# Patient Record
Sex: Female | Born: 1945 | Race: White | Hispanic: No | Smoking: Former smoker
Health system: Southern US, Community
[De-identification: ages and names within clinical notes are randomized; demographics above are authoritative.]

## PROBLEM LIST (undated history)

## (undated) DIAGNOSIS — M810 Age-related osteoporosis without current pathological fracture: Secondary | ICD-10-CM

## (undated) DIAGNOSIS — G4730 Sleep apnea, unspecified: Secondary | ICD-10-CM

## (undated) DIAGNOSIS — F988 Other specified behavioral and emotional disorders with onset usually occurring in childhood and adolescence: Secondary | ICD-10-CM

## (undated) DIAGNOSIS — Z6821 Body mass index (BMI) 21.0-21.9, adult: Secondary | ICD-10-CM

## (undated) DIAGNOSIS — K219 Gastro-esophageal reflux disease without esophagitis: Secondary | ICD-10-CM

## (undated) DIAGNOSIS — E039 Hypothyroidism, unspecified: Secondary | ICD-10-CM

## (undated) DIAGNOSIS — D369 Benign neoplasm, unspecified site: Secondary | ICD-10-CM

## (undated) DIAGNOSIS — M109 Gout, unspecified: Secondary | ICD-10-CM

## (undated) DIAGNOSIS — F32A Depression, unspecified: Secondary | ICD-10-CM

## (undated) DIAGNOSIS — D803 Selective deficiency of immunoglobulin G [IgG] subclasses: Secondary | ICD-10-CM

## (undated) DIAGNOSIS — G459 Transient cerebral ischemic attack, unspecified: Secondary | ICD-10-CM

## (undated) DIAGNOSIS — R42 Dizziness and giddiness: Secondary | ICD-10-CM

## (undated) DIAGNOSIS — M797 Fibromyalgia: Secondary | ICD-10-CM

## (undated) DIAGNOSIS — I471 Supraventricular tachycardia, unspecified: Secondary | ICD-10-CM

## (undated) DIAGNOSIS — R002 Palpitations: Secondary | ICD-10-CM

## (undated) DIAGNOSIS — E559 Vitamin D deficiency, unspecified: Secondary | ICD-10-CM

## (undated) DIAGNOSIS — R5383 Other fatigue: Secondary | ICD-10-CM

## (undated) DIAGNOSIS — M25552 Pain in left hip: Secondary | ICD-10-CM

## (undated) DIAGNOSIS — I509 Heart failure, unspecified: Secondary | ICD-10-CM

## (undated) DIAGNOSIS — K635 Polyp of colon: Secondary | ICD-10-CM

## (undated) DIAGNOSIS — G8929 Other chronic pain: Secondary | ICD-10-CM

## (undated) DIAGNOSIS — M25562 Pain in left knee: Secondary | ICD-10-CM

## (undated) DIAGNOSIS — D509 Iron deficiency anemia, unspecified: Secondary | ICD-10-CM

## (undated) DIAGNOSIS — M1990 Unspecified osteoarthritis, unspecified site: Secondary | ICD-10-CM

## (undated) DIAGNOSIS — M722 Plantar fascial fibromatosis: Secondary | ICD-10-CM

## (undated) DIAGNOSIS — F419 Anxiety disorder, unspecified: Secondary | ICD-10-CM

## (undated) DIAGNOSIS — E785 Hyperlipidemia, unspecified: Secondary | ICD-10-CM

## (undated) DIAGNOSIS — IMO0002 Reserved for concepts with insufficient information to code with codable children: Secondary | ICD-10-CM

## (undated) DIAGNOSIS — R5381 Other malaise: Secondary | ICD-10-CM

## (undated) DIAGNOSIS — M549 Dorsalgia, unspecified: Secondary | ICD-10-CM

## (undated) DIAGNOSIS — M7552 Bursitis of left shoulder: Secondary | ICD-10-CM

## (undated) DIAGNOSIS — I10 Essential (primary) hypertension: Secondary | ICD-10-CM

## (undated) DIAGNOSIS — J449 Chronic obstructive pulmonary disease, unspecified: Secondary | ICD-10-CM

## (undated) DIAGNOSIS — Z8709 Personal history of other diseases of the respiratory system: Secondary | ICD-10-CM

## (undated) DIAGNOSIS — G4700 Insomnia, unspecified: Secondary | ICD-10-CM

## (undated) DIAGNOSIS — J441 Chronic obstructive pulmonary disease with (acute) exacerbation: Secondary | ICD-10-CM

## (undated) DIAGNOSIS — I6529 Occlusion and stenosis of unspecified carotid artery: Secondary | ICD-10-CM

## (undated) DIAGNOSIS — N809 Endometriosis, unspecified: Secondary | ICD-10-CM

## (undated) DIAGNOSIS — F329 Major depressive disorder, single episode, unspecified: Secondary | ICD-10-CM

## (undated) DIAGNOSIS — E538 Deficiency of other specified B group vitamins: Secondary | ICD-10-CM

## (undated) DIAGNOSIS — L719 Rosacea, unspecified: Secondary | ICD-10-CM

## (undated) HISTORY — PX: YAG LASER APPLICATION: SHX6189

## (undated) HISTORY — DX: Body mass index (BMI) 21.0-21.9, adult: Z68.21

## (undated) HISTORY — DX: Other fatigue: R53.83

## (undated) HISTORY — DX: Other specified behavioral and emotional disorders with onset usually occurring in childhood and adolescence: F98.8

## (undated) HISTORY — DX: Heart failure, unspecified: I50.9

## (undated) HISTORY — DX: Supraventricular tachycardia, unspecified: I47.10

## (undated) HISTORY — DX: Deficiency of other specified B group vitamins: E53.8

## (undated) HISTORY — DX: Hypothyroidism, unspecified: E03.9

## (undated) HISTORY — DX: Transient cerebral ischemic attack, unspecified: G45.9

## (undated) HISTORY — PX: BACK SURGERY: SHX140

## (undated) HISTORY — DX: Chronic obstructive pulmonary disease with (acute) exacerbation: J44.1

## (undated) HISTORY — DX: Rosacea, unspecified: L71.9

## (undated) HISTORY — DX: Personal history of other diseases of the respiratory system: Z87.09

## (undated) HISTORY — DX: Other malaise: R53.81

## (undated) HISTORY — DX: Depression, unspecified: F32.A

## (undated) HISTORY — PX: SPINAL CORD STIMULATOR INSERTION: SHX5378

## (undated) HISTORY — DX: Gastro-esophageal reflux disease without esophagitis: K21.9

## (undated) HISTORY — DX: Dizziness and giddiness: R42

## (undated) HISTORY — DX: Benign neoplasm, unspecified site: D36.9

## (undated) HISTORY — DX: Pain in left hip: M25.552

## (undated) HISTORY — DX: Hyperlipidemia, unspecified: E78.5

## (undated) HISTORY — DX: Sleep apnea, unspecified: G47.30

## (undated) HISTORY — PX: VESICOVAGINAL FISTULA CLOSURE W/ TAH: SUR271

## (undated) HISTORY — PX: TONSILLECTOMY AND ADENOIDECTOMY: SUR1326

## (undated) HISTORY — DX: Gout, unspecified: M10.9

## (undated) HISTORY — DX: Reserved for concepts with insufficient information to code with codable children: IMO0002

## (undated) HISTORY — PX: TUBAL LIGATION: SHX77

## (undated) HISTORY — DX: Fibromyalgia: M79.7

## (undated) HISTORY — DX: Bursitis of left shoulder: M75.52

## (undated) HISTORY — DX: Insomnia, unspecified: G47.00

## (undated) HISTORY — DX: Unspecified osteoarthritis, unspecified site: M19.90

## (undated) HISTORY — DX: Occlusion and stenosis of unspecified carotid artery: I65.29

## (undated) HISTORY — DX: Selective deficiency of immunoglobulin g (igg) subclasses: D80.3

## (undated) HISTORY — DX: Age-related osteoporosis without current pathological fracture: M81.0

## (undated) HISTORY — PX: VAGINAL HYSTERECTOMY: SUR661

## (undated) HISTORY — PX: OTHER SURGICAL HISTORY: SHX169

## (undated) HISTORY — DX: Chronic obstructive pulmonary disease, unspecified: J44.9

## (undated) HISTORY — DX: Major depressive disorder, single episode, unspecified: F32.9

## (undated) HISTORY — PX: COLONOSCOPY: SHX174

## (undated) HISTORY — DX: Iron deficiency anemia, unspecified: D50.9

## (undated) HISTORY — DX: Palpitations: R00.2

## (undated) HISTORY — DX: Anxiety disorder, unspecified: F41.9

## (undated) HISTORY — DX: Polyp of colon: K63.5

## (undated) HISTORY — DX: Supraventricular tachycardia: I47.1

## (undated) HISTORY — DX: Essential (primary) hypertension: I10

## (undated) HISTORY — DX: Endometriosis, unspecified: N80.9

## (undated) HISTORY — DX: Vitamin D deficiency, unspecified: E55.9

## (undated) HISTORY — DX: Plantar fascial fibromatosis: M72.2

## (undated) HISTORY — DX: Pain in left knee: M25.562

---

## 2002-04-12 ENCOUNTER — Ambulatory Visit (HOSPITAL_BASED_OUTPATIENT_CLINIC_OR_DEPARTMENT_OTHER): Admission: RE | Admit: 2002-04-12 | Discharge: 2002-04-12 | Payer: Self-pay | Admitting: Orthopedic Surgery

## 2005-04-08 HISTORY — PX: COLONOSCOPY: SHX174

## 2008-02-10 ENCOUNTER — Encounter: Admission: RE | Admit: 2008-02-10 | Discharge: 2008-02-10 | Payer: Self-pay | Admitting: Orthopaedic Surgery

## 2008-06-29 ENCOUNTER — Inpatient Hospital Stay (HOSPITAL_COMMUNITY): Admission: RE | Admit: 2008-06-29 | Discharge: 2008-07-02 | Payer: Self-pay | Admitting: Neurosurgery

## 2008-09-07 ENCOUNTER — Encounter: Admission: RE | Admit: 2008-09-07 | Discharge: 2008-09-07 | Payer: Self-pay | Admitting: Neurosurgery

## 2008-09-11 ENCOUNTER — Encounter: Admission: RE | Admit: 2008-09-11 | Discharge: 2008-09-11 | Payer: Self-pay | Admitting: Neurosurgery

## 2008-11-01 ENCOUNTER — Encounter: Admission: RE | Admit: 2008-11-01 | Discharge: 2008-11-01 | Payer: Self-pay | Admitting: Neurosurgery

## 2008-11-09 ENCOUNTER — Encounter: Admission: RE | Admit: 2008-11-09 | Discharge: 2008-11-09 | Payer: Self-pay | Admitting: Neurosurgery

## 2009-01-11 ENCOUNTER — Encounter: Admission: RE | Admit: 2009-01-11 | Discharge: 2009-01-11 | Payer: Self-pay | Admitting: Neurosurgery

## 2011-04-09 NOTE — Op Note (Signed)
Samantha Fletcher, Samantha Fletcher NO.:  [id number]   MEDICAL RECORD NO.:  [id number]          PATIENT TYPE:  INP   LOCATION:  2899                         FACILITY:  [facility]   PHYSICIAN:  Donalee Citrin, M.D.        DATE OF BIRTH:  March 22, 1946   DATE OF PROCEDURE:  06/29/2008  DATE OF DISCHARGE:                               OPERATIVE REPORT   PREOPERATIVE DIAGNOSES:  Lumbar spinal stenosis, degenerative disk  disease, and facet arthropathy L4-L5.   PROCEDURES:  Decompressive laminectomy at L4-L5 in excess of what will  be needed with standard interbody fusion, posterior lumbar interbody  fusion at L4-L5 using two 8 x 22 mm Telamon PEEK cages packed with local  harvested autograft from the laminotomy, pedicle screw fixation at L4-L5  using the 6.35 Legacy pedicle screw system, posterolateral arthrodesis  at L4-L5 using locally harvested autograft, and placing the Hemovac  drain.   SURGEON:  Donalee Citrin, MD   ASSISTANT:  Tia Alert, MD   ANESTHESIA.:  General endotracheal.   HISTORY OF PRESENT ILLNESS:  The patient, Samantha Fletcher, is a 65 year old  female who has had progressive worsening back, bilateral leg pain,  neurogenic claudication after only short distances in the left greater  than right, and radiculopathy ranging in an L4-L5 nerve root pattern.  MRI scan showed severe facet arthropathy and herniated disk at L4-L5  causing severe spinal stenosis both centrally and biforaminally, worse  on the left.  The patient failed all forms of conservative treatment and  was recommended decompressive laminectomy, diskectomy, and fusion.  Risks and benefits of the operation were explained to the patient.  She  understood and agreed to proceed forward.   The patient was brought to the OR, induced under general anesthesia, and  positioned prone on the Wilson frame.  Back was prepped in usual sterile  fashion.  Preop incision was localized at the appropriate level.  After  the  infiltration of 10 mL lidocaine with epi, a midline incision was  made.  Bovie electrocautery was used to take down this.  Subperiosteal  dissection carried out on lamina of L4 and L5.  Intraoperative x-ray  confirmed localization of the L4 TP.  Then, the entire lamina, laminar  complex, and spinous process of L4 was removed and complete medial  facetectomies were performed at that level also.  The facets noted to be  markedly degenerating, competent, and displacing the thecal sac in a  hourglass fashion.  This was all teased away from the dura with a 4  Penfield, removed piecemeal fashion completing the medial facetectomies.  Both the 4 and the 5 roots were markedly stenotic, especially the 4 root  in the superior aspect of the facet.  This was all under bitten and  teased away.  After the decompression had been completed, the L5 nerve  root was unroofed.  It was decompressed except for the disk space behind  the __________, so first attention was taken to the pedicle screw  placement.  Using a high-speed drill, pilot holes were drilled at L4 on  the right,  cannulated with the awl, tapped with 5-5 tap, probed again,  and a 6 x 45 screw was inserted on the right at L4.  Fluoroscopy  confirmed at each step along the way trajectory in depth using both  external and internal canalicular landmarks.  The placement was  confirmed to have no medial or lateral breach.  The L5 screw was  inserted on the right in a similar fashion as well as at L4-L5 on the  left.  All screws were 6 x 45.  After all screws were in place,  attention was taken to the interbody work.  Disk space was incised on  the right at L4-L5, noted to be markedly collapsed and degenerated.  A  size 8 distractor was inserted.  Fluoroscopy confirmed this to be  appropriate size and for the graft material, so with the distractor in  place two 8 x 22 mm Telamons were opened working the disk space on the  left.  The disk space was  cleaned out and scraped with a size 8 cutter  and chisel.  A large central disk fragment was removed and scraped with  an Epstein curette and then a Telamon PEEK cage packed with local  autograft was packed in the left-sided interspace.  Then, the distractor  was removed.  Fluoroscopy confirmed good positioning of the graft.  Then, on the right side, the interspace was cleaned out in a similar  fashion.  After all central endplates had been scraped and the central  disk had been removed, local autograft was packed centrally against the  left-sided Telamon and a right-sided Telamon was inserted.  After all  interbody space had been done, the wound was copiously irrigated.  Meticulous hemostasis was maintained.  Aggressive decortication carried  on TPs and lateral gutters.  The remainder of the local autograft was  packed along the TPs at L4-L5.  Then, 30-mm rods were inserted,  __________ tightened down at L5.  The L4 pedicle screw was then  compressed against the L5 and then a medium Hemovac drain was placed.  Postop fluoroscopy confirmed good position of everything, all screws,  rods, and interbody spacers.  Then, after the drain was placed and the  foramina were confirmed to be widely patent, Gelfoam was overlaid on top  of the dura.  The muscle fascia were closed in layers with interrupted  Vicryl in a running 4-0 subcuticular to the skin.  Benzoin and Steri-  strips were applied.  The patient went to the recovery room in stable  condition.           ______________________________  Donalee Citrin, M.D.     GC/MEDQ  D:  06/29/2008  T:  06/30/2008  Job:  [id number]

## 2011-04-12 NOTE — Discharge Summary (Signed)
NAMEJAZELL, Samantha Fletcher NO.:  [id number]   MEDICAL RECORD NO.:  [id number]          PATIENT TYPE:  INP   LOCATION:  3018                         FACILITY:  [facility]   PHYSICIAN:  Donalee Citrin, M.D.        DATE OF BIRTH:  06/11/1946   DATE OF ADMISSION:  06/29/2008  DATE OF DISCHARGE:  07/02/2008                               DISCHARGE SUMMARY   ADMITTING DIAGNOSES:  Degenerative disk disease and lumbar spinal  stenosis at L4-5.   PROCEDURE:  __________ L4-5 posterior lumbar interbody fusion.   FINAL DIAGNOSIS AND DISCHARGE DIAGNOSIS:  Lumbar spinal stenosis at L4-  L5.   HISTORY OF PRESENT ILLNESS:  The patient is a very pleasant 65 year old  female who was admitted in [facility], went to the operating room, underwent  the aforementioned procedure.  Postoperatively, the patient did very  well and recovering on the floor.  The patient had complete resolution  of her leg pain.  She was progressively mobilized on the first and  second postoperative day.  Her Foley __________ taken out on day 1.  Physical therapy worked with her.  She was ambulating and voiding  spontaneously by day 3 and able to be discharged home, was scheduled to  follow up in 2 weeks.           ______________________________  Donalee Citrin, M.D.     GC/MEDQ  D:  07/28/2008  T:  07/29/2008  Job:  [id number]

## 2011-04-12 NOTE — Op Note (Signed)
[place]. [facility]  Patient:    Samantha Fletcher Visit Number: [id number] MRN: [id number]          Service Type: DSU Location: [facility] Attending Physician:  Alinda Deem Dictated by:   Alinda Deem, M.D. Proc. Date: 04/12/02 Admit Date:  04/12/2002 Discharge Date: 04/12/2002                             Operative Report  PREOPERATIVE DIAGNOSIS:  Right knee lateral meniscal tear.  POSTOPERATIVE DIAGNOSES: 1. Right knee lateral meniscal tear. 2. 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 3. Removal of loose body.  PROCEDURE:  Right knee partial arthroscopic lateral meniscectomy, debridement of chondromalacia from the medial femoral condyle and from the patella, and also debridement of a 5% tear of the ACL, and removal of loose body.  SURGEON:  Alinda Deem, M.D.  FIRST ASSISTANT:  Dorthula Matas, P.A.-C.  ANESTHESIA:  Local with IV sedation.  ESTIMATED BLOOD LOSS:  Minimal.  FLUID REPLACEMENT:  800 cc of crystalloid.  DRAINS PLACED:  None.  TOURNIQUET TIME:  None.  INDICATIONS FOR PROCEDURE:  A 65 year old woman who I believe was injured at work when she slipped and fell and sustained a lateral meniscal tear of the right knee.  She has failed conservative treatment, anti-inflammatory medicines, physical therapy, and activity modification.  MRI scan clearly shows a lateral meniscal tear, bucket handle-type, as well as some chondromalacia.  She is taken for arthroscopic evaluation and treatment of the right knee.  DESCRIPTION OF PROCEDURE:  The patient was identified by arm band and taken to the operating room at [facility].  Appropriate anesthetic monitors were attached and local anesthesia with IV sedation was induced into the right lower extremity.  Lateral post applied to the table.  The right lower extremity prepped and  draped in the usual sterile fashion from the ankle to the thigh.  A standard inferomedial and inferolateral parapatellar portals were then made, allowing introduction of the arthroscope through the inferolateral portal, and the outflow through the inferomedial portal.  Grade 3 chondromalacia of the apex of the distal pole of the patella was identified and debrided back to a stable margin with a 4.2 great white sucker shaver, and grade 3 to focal grade 4 chondromalacia of the distal and posterior condyles of the medial femoral condyle were identified and debrided as well.  The medial meniscus and medial tibial condyle as well as the lateral tibial condyle were in good shape as was the lateral femoral condyle.  The ACL had maybe 5% fiber tearing anteriorly.  This was debrided back to stable fibers. The lateral meniscus did have a bucket handle tear that required some extensive debridement with removal of most of the posterolateral corner of the lateral meniscus as well as the bucket handle section that went forward.  This was accomplished with a 4.2 great white sucker shaver.  The knee was washed out with normal saline solution.  The gutters were cleared.  We did find a loose body of cartilage hiding underneath the lateral meniscus and this was also removed.  The arthroscopic instruments were then removed.  A dressing of Xeroform, 4 x 4 dressing, sponges, Webril, and an Ace wrap applied. The patient was awakened and taken to the recovery room without difficulty. Dictated by:   Harrietta Guardian  Turner Daniels, M.D. Attending Physician:  Alinda Deem DD:  04/12/02 TD:  04/14/02 Job: [id number]

## 2011-08-15 ENCOUNTER — Other Ambulatory Visit: Payer: Self-pay | Admitting: Neurosurgery

## 2011-08-15 DIAGNOSIS — M545 Low back pain: Secondary | ICD-10-CM

## 2011-08-23 LAB — CBC
Hemoglobin: 12.2
MCHC: 32.5
RDW: 17.3 — ABNORMAL HIGH
WBC: 13 — ABNORMAL HIGH

## 2011-08-23 LAB — ABO/RH: ABO/RH(D): O NEG

## 2011-08-23 LAB — TYPE AND SCREEN: ABO/RH(D): O NEG

## 2011-08-23 LAB — BASIC METABOLIC PANEL: Glucose, Bld: 101 — ABNORMAL HIGH

## 2011-09-10 ENCOUNTER — Ambulatory Visit
Admission: RE | Admit: 2011-09-10 | Discharge: 2011-09-10 | Disposition: A | Discharge status: Home / Self Care | Payer: Medicare Other | Source: Ambulatory Visit | Attending: Neurosurgery | Admitting: Neurosurgery

## 2011-09-10 DIAGNOSIS — M545 Low back pain: Secondary | ICD-10-CM

## 2014-01-24 DIAGNOSIS — M5414 Radiculopathy, thoracic region: Secondary | ICD-10-CM | POA: Insufficient documentation

## 2014-01-24 DIAGNOSIS — M961 Postlaminectomy syndrome, not elsewhere classified: Secondary | ICD-10-CM

## 2014-01-24 DIAGNOSIS — R269 Unspecified abnormalities of gait and mobility: Secondary | ICD-10-CM

## 2014-01-24 DIAGNOSIS — M51379 Other intervertebral disc degeneration, lumbosacral region without mention of lumbar back pain or lower extremity pain: Secondary | ICD-10-CM

## 2014-01-24 DIAGNOSIS — M129 Arthropathy, unspecified: Secondary | ICD-10-CM | POA: Insufficient documentation

## 2014-01-24 DIAGNOSIS — M5417 Radiculopathy, lumbosacral region: Secondary | ICD-10-CM

## 2014-01-24 DIAGNOSIS — M5137 Other intervertebral disc degeneration, lumbosacral region: Secondary | ICD-10-CM

## 2014-01-24 DIAGNOSIS — M533 Sacrococcygeal disorders, not elsewhere classified: Secondary | ICD-10-CM

## 2014-01-24 HISTORY — DX: Other intervertebral disc degeneration, lumbosacral region: M51.37

## 2014-01-24 HISTORY — DX: Arthropathy, unspecified: M12.9

## 2014-01-24 HISTORY — DX: Sacrococcygeal disorders, not elsewhere classified: M53.3

## 2014-01-24 HISTORY — DX: Radiculopathy, thoracic region: M54.14

## 2014-01-24 HISTORY — DX: Postlaminectomy syndrome, not elsewhere classified: M96.1

## 2014-01-24 HISTORY — DX: Unspecified abnormalities of gait and mobility: R26.9

## 2014-01-24 HISTORY — DX: Other intervertebral disc degeneration, lumbosacral region without mention of lumbar back pain or lower extremity pain: M51.379

## 2014-01-24 HISTORY — DX: Radiculopathy, thoracic region: M54.17

## 2014-04-12 ENCOUNTER — Institutional Professional Consult (permissible substitution): Payer: Medicare Other | Admitting: Critical Care Medicine

## 2014-05-17 ENCOUNTER — Ambulatory Visit (INDEPENDENT_AMBULATORY_CARE_PROVIDER_SITE_OTHER): Payer: Medicare Other | Admitting: Critical Care Medicine

## 2014-05-17 ENCOUNTER — Encounter: Payer: Self-pay | Admitting: Critical Care Medicine

## 2014-05-17 VITALS — BP 126/64 | HR 69 | Temp 98.5°F | Ht 61.0 in | Wt 118.0 lb

## 2014-05-17 DIAGNOSIS — J4550 Severe persistent asthma, uncomplicated: Secondary | ICD-10-CM

## 2014-05-17 DIAGNOSIS — I6529 Occlusion and stenosis of unspecified carotid artery: Secondary | ICD-10-CM | POA: Insufficient documentation

## 2014-05-17 DIAGNOSIS — R059 Cough, unspecified: Secondary | ICD-10-CM

## 2014-05-17 DIAGNOSIS — R05 Cough: Secondary | ICD-10-CM | POA: Insufficient documentation

## 2014-05-17 DIAGNOSIS — E538 Deficiency of other specified B group vitamins: Secondary | ICD-10-CM | POA: Insufficient documentation

## 2014-05-17 DIAGNOSIS — F329 Major depressive disorder, single episode, unspecified: Secondary | ICD-10-CM | POA: Insufficient documentation

## 2014-05-17 DIAGNOSIS — D509 Iron deficiency anemia, unspecified: Secondary | ICD-10-CM | POA: Insufficient documentation

## 2014-05-17 DIAGNOSIS — E559 Vitamin D deficiency, unspecified: Secondary | ICD-10-CM | POA: Insufficient documentation

## 2014-05-17 DIAGNOSIS — D803 Selective deficiency of immunoglobulin G [IgG] subclasses: Secondary | ICD-10-CM | POA: Insufficient documentation

## 2014-05-17 DIAGNOSIS — E039 Hypothyroidism, unspecified: Secondary | ICD-10-CM | POA: Insufficient documentation

## 2014-05-17 DIAGNOSIS — J4551 Severe persistent asthma with (acute) exacerbation: Secondary | ICD-10-CM

## 2014-05-17 DIAGNOSIS — K219 Gastro-esophageal reflux disease without esophagitis: Secondary | ICD-10-CM

## 2014-05-17 DIAGNOSIS — J45901 Unspecified asthma with (acute) exacerbation: Secondary | ICD-10-CM

## 2014-05-17 DIAGNOSIS — F419 Anxiety disorder, unspecified: Secondary | ICD-10-CM | POA: Insufficient documentation

## 2014-05-17 DIAGNOSIS — F32A Depression, unspecified: Secondary | ICD-10-CM | POA: Insufficient documentation

## 2014-05-17 DIAGNOSIS — M810 Age-related osteoporosis without current pathological fracture: Secondary | ICD-10-CM | POA: Insufficient documentation

## 2014-05-17 DIAGNOSIS — M797 Fibromyalgia: Secondary | ICD-10-CM | POA: Insufficient documentation

## 2014-05-17 DIAGNOSIS — E785 Hyperlipidemia, unspecified: Secondary | ICD-10-CM | POA: Insufficient documentation

## 2014-05-17 DIAGNOSIS — G4730 Sleep apnea, unspecified: Secondary | ICD-10-CM

## 2014-05-17 HISTORY — DX: Cough, unspecified: R05.9

## 2014-05-17 MED ORDER — BUDESONIDE-FORMOTEROL FUMARATE 160-4.5 MCG/ACT IN AERO: 2.0000 | INHALATION_SPRAY | Freq: Two times a day (BID) | RESPIRATORY_TRACT | Status: DC

## 2014-05-17 MED ORDER — LOSARTAN POTASSIUM-HCTZ 100-12.5 MG PO TABS: 1.0000 | ORAL_TABLET | Freq: Every day | ORAL | Status: DC

## 2014-05-17 NOTE — Patient Instructions (Signed)
Start Symbicort two puff twice daily I will obtain labs from your primary care MD office Stop lisinopril/HCTZ  Start losartan 100mg  /HCTZ daily Breathing test will be obtained Records from [facility] office  Return 6 weeks

## 2014-05-17 NOTE — Assessment & Plan Note (Signed)
The patient would like to have her sleep apnea completely reevaluated Plan would be to refer to sleep medicine at [facility]

## 2014-05-17 NOTE — Assessment & Plan Note (Signed)
Severe persistent asthma with atopic features ACE inhibitor contributing to upper airway instability Incomplete database and that we need records from primary care for recent lab draws History of IgG deficiency documented 2011 need to see recent IgG level Plan Start Symbicort two puff twice daily Stop lisinopril/HCTZ  Start losartan 100mg  /HCTZ daily Spirometry pre-and post  Records from [facility] office  Return 6 weeks

## 2014-05-17 NOTE — Progress Notes (Signed)
Subjective:    Patient ID: Samantha Fletcher, female    DOB: 1946/02/10, 68 y.o.   MRN: [id number]  HPI Comments: Hx of chronic cough. Pt in hosp 12/2013: [facility]. ? Cause of cough ? PNA.  CXR neg.  Pt had fever, nausea, emesis.  D/c after 3d on abx and pred.  01/2014: another episode of syncope.  ? PNA. CT head neg.    Since past three months, no fever, coughs at night, mucus is clear to green. Pt has had immune issues  Cough This is a chronic problem. The current episode started more than 1 year ago. The problem has been waxing and waning. The cough is productive of purulent sputum. Associated symptoms include ear congestion, a fever, headaches, heartburn, hemoptysis, myalgias, nasal congestion, postnasal drip, rhinorrhea, sweats and wheezing. Pertinent negatives include no chest pain, chills, ear pain or shortness of breath. The symptoms are aggravated by fumes. Risk factors for lung disease include smoking/tobacco exposure (now smokes 4-5 cig per day). She has tried a beta-agonist inhaler for the symptoms. The treatment provided moderate relief. Her past medical history is significant for asthma, bronchitis, environmental allergies and pneumonia. There is no history of COPD or emphysema. skin testing pos years ago MOLD, dust, leaves    Past Medical History  Diagnosis Date  . Other malaise and fatigue   . Gout, unspecified   . Obstructive chronic bronchitis with exacerbation   . Rosacea   . Senile osteoporosis   . Hypertension   . Anxiety   . Depression   . Sleep apnea   . Vertigo   . IgG deficiency   . DDD (degenerative disc disease)   . DJD (degenerative joint disease)   . Hypothyroidism   . Hyperlipemia   . Attention deficit disorder   . Anemia, iron deficiency   . Gout   . Vitamin D deficiency   . TIA (transient ischemic attack)   . Vitamin B12 deficiency   . Insomnia   . Esophageal reflux   . Carotid artery occlusion     right  . Fibromyalgia   . Colon polyp   .  Osteoporosis      Family History  Problem Relation Age of Onset  . Emphysema Mother   . Rheum arthritis Mother      History   Social History  . Marital Status: Married    Spouse Name: N/A    Number of Children: N/A  . Years of Education: N/A   Occupational History  . Retired Sport and exercise psychologist   Social History Main Topics  . Smoking status: Current Every Day Smoker -- 0.25 packs/day    Types: Cigarettes    Start date: 11/25/1962  . Smokeless tobacco: Never Used  . Alcohol Use: No  . Drug Use: No  . Sexual Activity: Not on file   Other Topics Concern  . Not on file   Social History Narrative  . No narrative on file     Allergies  Allergen Reactions  . Celecoxib     Mouth sores  . Gabapentin     Confused, psychotic   . Nabumetone     Mouth sores  . Naproxen     Dropped WBC count  . Tramadol     Mouth sores     No outpatient prescriptions prior to visit.   No facility-administered medications prior to visit.      Review of Systems  Constitutional: Positive for fever,  appetite change, fatigue and unexpected weight change. Negative for chills.  HENT: Positive for postnasal drip, rhinorrhea, sinus pressure and trouble swallowing. Negative for ear pain.   Respiratory: Positive for cough, hemoptysis, chest tightness and wheezing. Negative for shortness of breath.   Cardiovascular: Negative for chest pain.  Gastrointestinal: Positive for heartburn.  Musculoskeletal: Positive for myalgias.  Allergic/Immunologic: Positive for environmental allergies.  Neurological: Positive for headaches.       Objective:   Physical Exam Filed Vitals:   05/17/14 1559  BP: 126/64  Pulse: 69  Temp: 98.5 F (36.9 C)  TempSrc: Oral  Height: 5\' 1"  (1.549 m)  Weight: 118 lb (53.524 kg)  SpO2: 94%    Gen: Pleasant, well-nourished, in no distress,  normal affect  ENT: No lesions,  mouth clear,  oropharynx clear, no postnasal drip  Neck: No JVD, no TMG, no  carotid bruits  Lungs: No use of accessory muscles, no dullness to percussion, expired wheezes with poor airflow  Cardiovascular: RRR, heart sounds normal, no murmur or gallops, no peripheral edema  Abdomen: soft and NT, no HSM,  BS normal  Musculoskeletal: No deformities, no cyanosis or clubbing  Neuro: alert, non focal  Skin: Diffuse ecchymoses  No results found.  Chest x-ray reviewed from previous along with CT scan and there is a right lower lobe tree in bud pattern in the CT scan from February 2015 that is not apparent on current chest x-ray  No recent spirometry available  Records from [facility] are reviewed and documented significant IgG deficiency with a level of 383 there is adequate antibody response of metastases seen on antibody testing HIV was negative       Assessment & Plan:   Asthma, severe persistent Severe persistent asthma with atopic features ACE inhibitor contributing to upper airway instability Incomplete database and that we need records from primary care for recent lab draws History of IgG deficiency documented 2011 need to see recent IgG level Plan Start Symbicort two puff twice daily Stop lisinopril/HCTZ  Start losartan 100mg  /HCTZ daily Spirometry pre-and post  Records from [facility] office  Return 6 weeks   Sleep apnea The patient would like to have her sleep apnea completely reevaluated Plan would be to refer to sleep medicine at [facility]    Updated Medication List Outpatient Encounter Prescriptions as of 05/17/2014  Medication Sig  . albuterol (PROAIR HFA) 108 (90 BASE) MCG/ACT inhaler 1-2 puffs every 4-6 hrs as needed  . allopurinol (ZYLOPRIM) 100 MG tablet Take 1 tablet by mouth daily.  Marland Kitchen ALPRAZolam (XANAX) 1 MG tablet Take 1 tablet by mouth 3 (three) times daily as needed.  Marland Kitchen ammonium lactate (LAC-HYDRIN) 12 % lotion Apply 1 application topically as needed for dry skin.  Marland Kitchen buPROPion (WELLBUTRIN XL) 150 MG 24 hr tablet Take 1 tablet by mouth  2 (two) times daily.  . Calcium Carbonate-Vitamin D (CALCIUM-CARB 600 + D PO) Take by mouth daily.  . cholecalciferol (VITAMIN D) 1000 UNITS tablet Take 1,000 Units by mouth daily.  . cyanocobalamin (,VITAMIN B-12,) 1000 MCG/ML injection 1 injection monthly  . cyclobenzaprine (FLEXERIL) 5 MG tablet Take 5 mg by mouth 3 (three) times daily as needed for muscle spasms.  . Dermatological Products, Misc. (DERMAREST ROSACEA EX) Apply topically 2 (two) times daily as needed.  . diclofenac sodium (VOLTAREN) 1 % GEL Apply 1 % topically as needed.  . ergocalciferol (VITAMIN D2) 50000 UNITS capsule Take 1 capsule by mouth every 7 (seven) days.  Marland Kitchen ezetimibe-simvastatin (VYTORIN) 10-20  MG per tablet Take 1 tablet by mouth at bedtime.  . Ferrous Fum-Iron Polysacch (TANDEM PO) Take 1 capsule by mouth daily.  Marland Kitchen levothyroxine (SYNTHROID) 75 MCG tablet Take 1 tablet by mouth daily.  Marland Kitchen LINZESS 145 MCG CAPS capsule Take 1 capsule by mouth daily.  . meclizine (ANTIVERT) 25 MG tablet Take 25 mg by mouth 3 (three) times daily as needed.  . metroNIDAZOLE (METROGEL) 1 % gel Twice daily  . Milnacipran (SAVELLA) 50 MG TABS tablet Take 50 mg by mouth 2 (two) times daily.  . mirabegron ER (MYRBETRIQ) 25 MG TB24 tablet Take 25 mg by mouth daily.  Marland Kitchen morphine (MS CONTIN) 30 MG 12 hr tablet Take 30 mg by mouth 2 (two) times daily.  . nitrofurantoin, macrocrystal-monohydrate, (MACROBID) 100 MG capsule Take 100 mg by mouth daily.  Marland Kitchen oxyCODONE-acetaminophen (PERCOCET) 10-325 MG per tablet Take 1 tablet by mouth every 6 (six) hours as needed.  . promethazine (PHENERGAN) 25 MG tablet Take 25 mg by mouth as needed.  . ranitidine (ZANTAC) 300 MG tablet Take 1 tablet by mouth at bedtime.  Marland Kitchen zolpidem (AMBIEN) 10 MG tablet Take 10 mg by mouth at bedtime.  . [DISCONTINUED] lisinopril-hydrochlorothiazide (PRINZIDE,ZESTORETIC) 20-25 MG per tablet Take 1 tablet by mouth daily.  . budesonide-formoterol (SYMBICORT) 160-4.5 MCG/ACT inhaler  Inhale 2 puffs into the lungs 2 (two) times daily.  Marland Kitchen losartan-hydrochlorothiazide (HYZAAR) 100-12.5 MG per tablet Take 1 tablet by mouth daily.

## 2014-05-23 ENCOUNTER — Encounter: Payer: Self-pay | Admitting: Critical Care Medicine

## 2014-05-23 LAB — PULMONARY FUNCTION TEST

## 2014-05-25 ENCOUNTER — Telehealth: Payer: Self-pay | Admitting: Critical Care Medicine

## 2014-05-25 DIAGNOSIS — J4550 Severe persistent asthma, uncomplicated: Secondary | ICD-10-CM

## 2014-05-25 NOTE — Telephone Encounter (Signed)
Called, spoke with pt.  Informed her of below results and recs per Dr. Joya Gaskins.   She verbalized understanding and is aware of pending appt with PW on Aug 4 at 2 pm in [place]. She voiced no further questions or concerns at this time.

## 2014-05-25 NOTE — Telephone Encounter (Signed)
Tell the pt spirometry was NORMAL.    Stay on symbicort for now Keep next appt

## 2014-06-02 DIAGNOSIS — M7060 Trochanteric bursitis, unspecified hip: Secondary | ICD-10-CM

## 2014-06-02 HISTORY — DX: Trochanteric bursitis, unspecified hip: M70.60

## 2014-06-15 ENCOUNTER — Encounter: Payer: Self-pay | Admitting: Critical Care Medicine

## 2014-06-28 ENCOUNTER — Encounter: Payer: Self-pay | Admitting: Critical Care Medicine

## 2014-06-28 ENCOUNTER — Ambulatory Visit (INDEPENDENT_AMBULATORY_CARE_PROVIDER_SITE_OTHER): Payer: Medicare Other | Admitting: Critical Care Medicine

## 2014-06-28 VITALS — BP 100/58 | HR 71 | Temp 98.7°F | Ht 61.75 in | Wt 119.0 lb

## 2014-06-28 DIAGNOSIS — J45909 Unspecified asthma, uncomplicated: Secondary | ICD-10-CM

## 2014-06-28 DIAGNOSIS — J4550 Severe persistent asthma, uncomplicated: Secondary | ICD-10-CM

## 2014-06-28 DIAGNOSIS — F17200 Nicotine dependence, unspecified, uncomplicated: Secondary | ICD-10-CM

## 2014-06-28 HISTORY — DX: Nicotine dependence, unspecified, uncomplicated: F17.200

## 2014-06-28 NOTE — Assessment & Plan Note (Signed)
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

## 2014-06-28 NOTE — Patient Instructions (Signed)
Stop symbicort  Use proair as needed Return as needed

## 2014-06-28 NOTE — Assessment & Plan Note (Signed)
Ongoing tobacco use Patient was again advised to pursue smoking cessation

## 2014-06-28 NOTE — Progress Notes (Signed)
Subjective:    Patient ID: Samantha Fletcher, female    DOB: Aug 05, 1946, 68 y.o.   MRN: [id number]  HPI Comments: Hx of chronic cough. Pt in hosp 12/2013: [facility]. ? Cause of cough ? PNA.  CXR neg.  Pt had fever, nausea, emesis.  D/c after 3d on abx and pred.  01/2014: another episode of syncope.  ? PNA. CT head neg.    Since past three months, no fever, coughs at night, mucus is clear to green. Pt has had immune issues  06/28/2014 Chief Complaint  Patient presents with  . Follow-up    feeling about the same,no cough,no wheeze, clearing throat a lot since using Symbicort last time,poor appetite  No real change in symbicorts and irritates the throat The cough is not present at this time No change in dyspnea Ongoing tobacco use one pack per day      Review of Systems  Constitutional: Positive for appetite change, fatigue and unexpected weight change.  HENT: Positive for sinus pressure and trouble swallowing.   Respiratory: Positive for chest tightness.        Objective:   Physical Exam  Filed Vitals:   06/28/14 1414  BP: 100/58  Pulse: 71  Temp: 98.7 F (37.1 C)  TempSrc: Oral  Height: 5' 1.75" (1.568 m)  Weight: 119 lb (53.978 kg)  SpO2: 96%    Gen: Pleasant, well-nourished, in no distress,  normal affect  ENT: No lesions,  mouth clear,  oropharynx clear, no postnasal drip  Neck: No JVD, no TMG, no carotid bruits  Lungs: No use of accessory muscles, no dullness to percussion, fair airflow  Cardiovascular: RRR, heart sounds normal, no murmur or gallops, no peripheral edema  Abdomen: soft and NT, no HSM,  BS normal  Musculoskeletal: No deformities, no cyanosis or clubbing  Neuro: alert, non focal  Skin: Diffuse ecchymoses  No results found.  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 albuterol (PROAIR HFA) 108 (90 BASE) MCG/ACT inhaler 1-2 puffs every 4-6 hrs as needed  . allopurinol (ZYLOPRIM) 100 MG tablet Take 1 tablet by mouth daily.  Marland Kitchen ALPRAZolam (XANAX) 1 MG tablet Take 1 tablet by mouth 3 (three) times daily as needed.  Marland Kitchen ammonium lactate (LAC-HYDRIN) 12 % lotion Apply 1 application topically as needed for dry skin.  Marland Kitchen buPROPion (WELLBUTRIN XL) 300 MG 24 hr tablet Take 300 mg by mouth daily.  . Calcium Carbonate-Vitamin D (CALCIUM-CARB 600 + D PO) Take by mouth daily.  . cholecalciferol (VITAMIN D) 1000 UNITS tablet Take 1,000 Units by mouth daily.  . cyanocobalamin (,VITAMIN B-12,) 1000 MCG/ML injection 1 injection monthly  . cyclobenzaprine (FLEXERIL) 5 MG tablet Take  5 mg by mouth 3 (three) times daily as needed for muscle spasms.  . Dermatological Products, Misc. (DERMAREST ROSACEA EX) Apply topically 2 (two) times daily as needed.  . diclofenac sodium (VOLTAREN) 1 % GEL Apply 1 % topically as needed.  . ergocalciferol (VITAMIN D2) 50000 UNITS capsule Take 1 capsule by mouth every 7 (seven) days.  Marland Kitchen ezetimibe-simvastatin (VYTORIN) 10-20 MG per tablet Take 1 tablet by mouth at bedtime.  . Ferrous Fum-Iron Polysacch (TANDEM PO) Take 1 capsule by mouth daily.  Marland Kitchen levothyroxine (SYNTHROID) 75 MCG tablet Take 1 tablet by mouth  daily.  Marland Kitchen LINZESS 145 MCG CAPS capsule Take 1 capsule by mouth daily.  Marland Kitchen losartan-hydrochlorothiazide (HYZAAR) 100-12.5 MG per tablet Take 1 tablet by mouth daily.  . meclizine (ANTIVERT) 25 MG tablet Take 25 mg by mouth 3 (three) times daily as needed.  . metroNIDAZOLE (METROGEL) 1 % gel Twice daily  . Milnacipran (SAVELLA) 50 MG TABS tablet Take 50 mg by mouth 2 (two) times daily.  . mirabegron ER (MYRBETRIQ) 25 MG TB24 tablet Take 25 mg by mouth daily.  Marland Kitchen morphine (MS CONTIN) 30 MG 12 hr tablet Take 30 mg by mouth 2 (two) times daily.  Marland Kitchen oxyCODONE-acetaminophen (PERCOCET) 10-325 MG per tablet Take 1 tablet by mouth every 6 (six) hours as needed.  . promethazine (PHENERGAN) 25 MG tablet Take 25 mg by mouth as needed.  . ranitidine (ZANTAC) 300 MG tablet Take 1 tablet by mouth at bedtime.  . SSD 1 % cream Once a day topically  . zolpidem (AMBIEN) 10 MG tablet Take 10 mg by mouth at bedtime.  . [DISCONTINUED] budesonide-formoterol (SYMBICORT) 160-4.5 MCG/ACT inhaler Inhale 2 puffs into the lungs 2 (two) times daily.  . nitrofurantoin, macrocrystal-monohydrate, (MACROBID) 100 MG capsule Take 100 mg by mouth daily.  . [DISCONTINUED] buPROPion (WELLBUTRIN XL) 150 MG 24 hr tablet Take 1 tablet by mouth 2 (two) times daily.

## 2014-07-29 DIAGNOSIS — Z87440 Personal history of urinary (tract) infections: Secondary | ICD-10-CM

## 2014-07-29 DIAGNOSIS — R339 Retention of urine, unspecified: Secondary | ICD-10-CM

## 2014-07-29 HISTORY — DX: Personal history of urinary (tract) infections: Z87.440

## 2014-07-29 HISTORY — DX: Retention of urine, unspecified: R33.9

## 2015-06-13 ENCOUNTER — Other Ambulatory Visit: Payer: Self-pay | Admitting: Critical Care Medicine

## 2015-06-14 NOTE — Telephone Encounter (Signed)
Pt needs appointment for further refills 

## 2015-07-13 ENCOUNTER — Other Ambulatory Visit: Payer: Self-pay | Admitting: Critical Care Medicine

## 2016-01-24 ENCOUNTER — Ambulatory Visit (INDEPENDENT_AMBULATORY_CARE_PROVIDER_SITE_OTHER): Payer: 59

## 2016-01-24 ENCOUNTER — Encounter: Payer: Self-pay | Admitting: Sports Medicine

## 2016-01-24 ENCOUNTER — Ambulatory Visit (INDEPENDENT_AMBULATORY_CARE_PROVIDER_SITE_OTHER): Payer: 59 | Admitting: Sports Medicine

## 2016-01-24 DIAGNOSIS — R29898 Other symptoms and signs involving the musculoskeletal system: Secondary | ICD-10-CM | POA: Diagnosis not present

## 2016-01-24 DIAGNOSIS — M216X1 Other acquired deformities of right foot: Secondary | ICD-10-CM

## 2016-01-24 DIAGNOSIS — M2040 Other hammer toe(s) (acquired), unspecified foot: Secondary | ICD-10-CM

## 2016-01-24 DIAGNOSIS — L909 Atrophic disorder of skin, unspecified: Secondary | ICD-10-CM

## 2016-01-24 DIAGNOSIS — M79673 Pain in unspecified foot: Secondary | ICD-10-CM

## 2016-01-24 NOTE — Progress Notes (Signed)
Patient ID: JAZIRA MALONEY, female   DOB: Feb 14, 1946, 70 y.o.   MRN: [id number] Subjective: TRELLA THURMOND is a 70 y.o. female patient who presents to office for evaluation of Right> Left foot pain. Patient states that both her feet hurt along the bottoms diffusely. Admits to a generalized burning sensation and pain at the prominent metatarsal on right foot and at bilateral second toes at the tip. Patient admits to a previous history of plantar fasciitis and also a history significant for lumbar radiculopathy with several spinal surgeries. Patient states that she has tried inserts and states that the softer cushioned inserts and pads seem to help.  Patient denies any other pedal complaints. Denies injury/trip/fall/sprain/any other causative factors.   Patient Active Problem List   Diagnosis Date Noted  . Tobacco use disorder 06/28/2014  . Bursitis, trochanteric 06/02/2014  . Cough 05/17/2014  . Anxiety   . Depression   . Sleep apnea   . IgG deficiency ([place])   . Hypothyroidism   . Hyperlipemia   . Anemia, iron deficiency   . Vitamin D deficiency   . Vitamin B12 deficiency   . Fibromyalgia   . Osteoporosis   . Carotid artery occlusion   . Esophageal reflux   . DDD (degenerative disc disease), lumbosacral 01/24/2014  . Arthropathia 01/24/2014  . Disorder of sacrum 01/24/2014  . 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 Thoracic and lumbosacral neuritis 01/24/2014  . Abnormal gait 01/24/2014    Current Outpatient Prescriptions on File Prior to Visit  Medication Sig Dispense Refill  . albuterol (PROAIR HFA) 108 (90 BASE) MCG/ACT inhaler 1-2 puffs every 4-6 hrs as needed    . allopurinol (ZYLOPRIM) 100 MG tablet Take 1 tablet by mouth daily.    Marland Kitchen ALPRAZolam (XANAX) 1 MG tablet Take 1 tablet by mouth 3 (three) times daily as needed.    Marland Kitchen ammonium lactate (LAC-HYDRIN) 12 % lotion Apply 1 application topically as needed for dry skin.    Marland Kitchen buPROPion (WELLBUTRIN XL) 300 MG 24 hr  tablet Take 300 mg by mouth daily.    . Calcium Carbonate-Vitamin D (CALCIUM-CARB 600 + D PO) Take by mouth daily.    . cholecalciferol (VITAMIN D) 1000 UNITS tablet Take 1,000 Units by mouth daily.    . cyanocobalamin (,VITAMIN B-12,) 1000 MCG/ML injection 1 injection monthly    . cyclobenzaprine (FLEXERIL) 5 MG tablet Take 5 mg by mouth 3 (three) times daily as needed for muscle spasms.    . Dermatological Products, Misc. (DERMAREST ROSACEA EX) Apply topically 2 (two) times daily as needed.    . diclofenac sodium (VOLTAREN) 1 % GEL Apply 1 % topically as needed.    . ergocalciferol (VITAMIN D2) 50000 UNITS capsule Take 1 capsule by mouth every 7 (seven) days.    Marland Kitchen ezetimibe-simvastatin (VYTORIN) 10-20 MG per tablet Take 1 tablet by mouth at bedtime.    . Ferrous Fum-Iron Polysacch (TANDEM PO) Take 1 capsule by mouth daily.    Marland Kitchen levothyroxine (SYNTHROID) 75 MCG tablet Take 1 tablet by mouth daily.    Marland Kitchen LINZESS 145 MCG CAPS capsule Take 1 capsule by mouth daily.    Marland Kitchen losartan-hydrochlorothiazide (HYZAAR) 100-12.5 MG per tablet TAKE 1 TABLET BY MOUTH EVERY DAY 30 tablet 2  . meclizine (ANTIVERT) 25 MG tablet Take 25 mg by mouth 3 (three) times daily as needed.    . metroNIDAZOLE (METROGEL) 1 % gel Twice daily    . Milnacipran (SAVELLA) 50 MG TABS tablet  Take 50 mg by mouth 2 (two) times daily.    . mirabegron ER (MYRBETRIQ) 25 MG TB24 tablet Take 25 mg by mouth daily.    Marland Kitchen morphine (MS CONTIN) 30 MG 12 hr tablet Take 30 mg by mouth 2 (two) times daily.    . nitrofurantoin, macrocrystal-monohydrate, (MACROBID) 100 MG capsule Take 100 mg by mouth daily.    Marland Kitchen oxyCODONE-acetaminophen (PERCOCET) 10-325 MG per tablet Take 1 tablet by mouth every 6 (six) hours as needed.    . promethazine (PHENERGAN) 25 MG tablet Take 25 mg by mouth as needed.    . ranitidine (ZANTAC) 300 MG tablet Take 1 tablet by mouth at bedtime.    . SSD 1 % cream Once a day topically    . zolpidem (AMBIEN) 10 MG tablet Take 10 mg  by mouth at bedtime.     No current facility-administered medications on file prior to visit.    Allergies  Allergen Reactions  . Celecoxib     Mouth sores  . Gabapentin     Confused, psychotic   . Nabumetone     Mouth sores  . Naproxen     Dropped WBC count  . Tramadol     Mouth sores    Objective:  General: Alert and oriented x3 in no acute distress  Dermatology: No open lesions bilateral lower extremities, no webspace macerations, no ecchymosis bilateral, all nails x 10 are well manicured. Previous callus sub-met 1 on right and left medial second toe well maintained and trimmed by patient  Vascular: Dorsalis Pedis and Posterior Tibial pedal pulses 1/4, Capillary Fill Time 3 seconds, decreased pedal hair growth bilateral, no edema bilateral lower extremities, Temperature gradient within normal limits.  Neurology: Gross sensation intact via light touch bilateral. (- )Tinels sign.  Musculoskeletal: Mild tenderness with palpation at areas of bony deformity at plantar flex met on right and hammertoes bilateral second, there is significant fat pad atrophy. Patient is primarily just skin and bones when it comes to her feet with no supportive cushioned, No pain with calf compression bilateral. Ankle and pedal joint range of motion is within normal limits, Strength within normal limits in all groups bilateral.   Xrays  Right Foot/Left Foot   Impression: Normal osseous mineralization. There is inferior calcaneal spur present with no significant thickening of plantar fascia. There is lesser hammertoe deformities and mildly elongated second metatarsal and prominent plantarflexed first metatarsal, right greater than left. No acute fracture, dislocation, soft tissues within normal limits. No foreign body.   Assessment and Plan: Problem List Items Addressed This Visit    None    Visit Diagnoses    Foot pain, unspecified laterality    -  Primary    Relevant Orders    DG Foot 2 Views Left     DG Foot 2 Views Right    Fat pad atrophy of foot        Hammer toe, unspecified laterality        Prominent metatarsal head of right foot           -Complete examination performed -Xrays reviewed -Discussed treatement options for pain that is likely related to foot structure/foot atrophy, hammertoes and prominent metatarsal head on right that is likely complicated by history of radiculopathy -Recommended good supportive shoes, inserts daily -Gave patient metatarsal pads to use to help cushion and support to ball of foot -Advised topical creams to help with pain such as Voltaren gel which she owns and Epsom salt  soaks as needed -Recommend ice as needed -Patient to return to office as needed or sooner if condition worsens.  Landis Martins, DPM

## 2016-03-06 DIAGNOSIS — M79672 Pain in left foot: Secondary | ICD-10-CM

## 2016-07-15 ENCOUNTER — Ambulatory Visit: Payer: Medicare Other | Admitting: Gastroenterology

## 2016-07-25 DIAGNOSIS — M79673 Pain in unspecified foot: Secondary | ICD-10-CM

## 2016-10-03 ENCOUNTER — Emergency Department (HOSPITAL_COMMUNITY): Payer: Medicare Other

## 2016-10-03 ENCOUNTER — Emergency Department (HOSPITAL_COMMUNITY)
Admission: EM | Admit: 2016-10-03 | Discharge: 2016-10-03 | Disposition: A | Discharge status: Home / Self Care | Payer: Medicare Other | Attending: Emergency Medicine | Admitting: Emergency Medicine

## 2016-10-03 ENCOUNTER — Encounter (HOSPITAL_COMMUNITY): Payer: Self-pay

## 2016-10-03 DIAGNOSIS — Y92410 Unspecified street and highway as the place of occurrence of the external cause: Secondary | ICD-10-CM | POA: Insufficient documentation

## 2016-10-03 DIAGNOSIS — S81811A Laceration without foreign body, right lower leg, initial encounter: Secondary | ICD-10-CM | POA: Insufficient documentation

## 2016-10-03 DIAGNOSIS — R0789 Other chest pain: Secondary | ICD-10-CM | POA: Insufficient documentation

## 2016-10-03 DIAGNOSIS — S52351A Displaced comminuted fracture of shaft of radius, right arm, initial encounter for closed fracture: Secondary | ICD-10-CM | POA: Insufficient documentation

## 2016-10-03 DIAGNOSIS — S52371A Galeazzi's fracture of right radius, initial encounter for closed fracture: Secondary | ICD-10-CM

## 2016-10-03 DIAGNOSIS — R42 Dizziness and giddiness: Secondary | ICD-10-CM | POA: Insufficient documentation

## 2016-10-03 DIAGNOSIS — Y939 Activity, unspecified: Secondary | ICD-10-CM | POA: Insufficient documentation

## 2016-10-03 DIAGNOSIS — S0180XA Unspecified open wound of other part of head, initial encounter: Secondary | ICD-10-CM | POA: Insufficient documentation

## 2016-10-03 DIAGNOSIS — E039 Hypothyroidism, unspecified: Secondary | ICD-10-CM | POA: Insufficient documentation

## 2016-10-03 DIAGNOSIS — Z8673 Personal history of transient ischemic attack (TIA), and cerebral infarction without residual deficits: Secondary | ICD-10-CM | POA: Insufficient documentation

## 2016-10-03 DIAGNOSIS — F17210 Nicotine dependence, cigarettes, uncomplicated: Secondary | ICD-10-CM | POA: Insufficient documentation

## 2016-10-03 DIAGNOSIS — R935 Abnormal findings on diagnostic imaging of other abdominal regions, including retroperitoneum: Secondary | ICD-10-CM | POA: Insufficient documentation

## 2016-10-03 DIAGNOSIS — Y999 Unspecified external cause status: Secondary | ICD-10-CM | POA: Insufficient documentation

## 2016-10-03 DIAGNOSIS — R52 Pain, unspecified: Secondary | ICD-10-CM

## 2016-10-03 DIAGNOSIS — Z23 Encounter for immunization: Secondary | ICD-10-CM | POA: Insufficient documentation

## 2016-10-03 DIAGNOSIS — Z79899 Other long term (current) drug therapy: Secondary | ICD-10-CM | POA: Insufficient documentation

## 2016-10-03 LAB — COMPREHENSIVE METABOLIC PANEL
ALBUMIN: 4.1 g/dL (ref 3.5–5.0)
ALK PHOS: 70 U/L (ref 38–126)
ALT: 27 U/L (ref 14–54)
ANION GAP: 14 (ref 5–15)
AST: 50 U/L — ABNORMAL HIGH (ref 15–41)
BILIRUBIN TOTAL: 0.5 mg/dL (ref 0.3–1.2)
BUN: 23 mg/dL — ABNORMAL HIGH (ref 6–20)
CALCIUM: 9.1 mg/dL (ref 8.9–10.3)
CO2: 24 mmol/L (ref 22–32)
Chloride: 95 mmol/L — ABNORMAL LOW (ref 101–111)
Creatinine, Ser: 1.46 mg/dL — ABNORMAL HIGH (ref 0.44–1.00)
GFR calc non Af Amer: 36 mL/min — ABNORMAL LOW (ref >60)
GFR, EST AFRICAN AMERICAN: 41 mL/min — AB (ref >60)
GLUCOSE: 111 mg/dL — AB (ref 65–99)
POTASSIUM: 2.8 mmol/L — AB (ref 3.5–5.1)
SODIUM: 133 mmol/L — AB (ref 135–145)
TOTAL PROTEIN: 6 g/dL — AB (ref 6.5–8.1)

## 2016-10-03 LAB — CBC
HCT: 32.7 % — ABNORMAL LOW (ref 36.0–46.0)
Hemoglobin: 10.6 g/dL — ABNORMAL LOW (ref 12.0–15.0)
MCH: 27.9 pg (ref 26.0–34.0)
MCHC: 32.4 g/dL (ref 30.0–36.0)
MCV: 86.1 fL (ref 78.0–100.0)
Platelets: 307 10*3/uL (ref 150–400)
RBC: 3.8 MIL/uL — ABNORMAL LOW (ref 3.87–5.11)
RDW: 14.6 % (ref 11.5–15.5)
WBC: 15.8 10*3/uL — ABNORMAL HIGH (ref 4.0–10.5)

## 2016-10-03 LAB — URINALYSIS, ROUTINE W REFLEX MICROSCOPIC
Bilirubin Urine: NEGATIVE
GLUCOSE, UA: NEGATIVE mg/dL
Ketones, ur: 15 mg/dL — AB
Nitrite: NEGATIVE
PH: 6 (ref 5.0–8.0)
Protein, ur: 30 mg/dL — AB
SPECIFIC GRAVITY, URINE: 1.041 — AB (ref 1.005–1.030)

## 2016-10-03 LAB — RAPID URINE DRUG SCREEN, HOSP PERFORMED
AMPHETAMINES: NOT DETECTED
BENZODIAZEPINES: POSITIVE — AB
Barbiturates: NOT DETECTED
COCAINE: NOT DETECTED
Opiates: NOT DETECTED
Tetrahydrocannabinol: NOT DETECTED

## 2016-10-03 LAB — ETHANOL

## 2016-10-03 LAB — URINE MICROSCOPIC-ADD ON

## 2016-10-03 MED ORDER — TETANUS-DIPHTH-ACELL PERTUSSIS 5-2.5-18.5 LF-MCG/0.5 IM SUSP
0.5000 mL | Freq: Once | INTRAMUSCULAR | Status: AC
  Administered 2016-10-03: 0.5 mL via INTRAMUSCULAR
  Filled 2016-10-03: qty 0.5

## 2016-10-03 MED ORDER — IOPAMIDOL (ISOVUE-300) INJECTION 61%
INTRAVENOUS | Status: AC
  Administered 2016-10-03: 75 mL
  Filled 2016-10-03: qty 100

## 2016-10-03 MED ORDER — LIDOCAINE HCL (PF) 1 % IJ SOLN
20.0000 mL | Freq: Once | INTRAMUSCULAR | Status: AC
  Administered 2016-10-03: 10 mL via INTRADERMAL
  Filled 2016-10-03: qty 20

## 2016-10-03 MED ORDER — MORPHINE SULFATE (PF) 4 MG/ML IV SOLN
4.0000 mg | Freq: Once | INTRAVENOUS | Status: AC
  Administered 2016-10-03: 4 mg via INTRAVENOUS
  Filled 2016-10-03: qty 1

## 2016-10-03 MED ORDER — OXYCODONE HCL 5 MG PO TABS: 5.0000 mg | ORAL_TABLET | ORAL | 0 refills | Status: DC | PRN
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
## 2016-10-03 NOTE — ED Notes (Signed)
ED Provider at bedside. 

## 2016-10-03 NOTE — Discharge Instructions (Signed)
Sutures will be removed in ten days. You should get a call for surgery for your arm. Come back for numbness, weakness, or any other concerns.

## 2016-10-03 NOTE — ED Notes (Signed)
This RN and Charm Rings in room to stand pt up to get in wheelchair and pt became dizzy and fell forward but caught by this RN. Pt assisted back to Bed. BP 119/54, HR 80
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
## 2016-10-03 NOTE — ED Notes (Signed)
Stood pt up and got her to w/c w/ assist from [place], Therapist, sports. Pt showing NAD. RR even and unlabored. Pt was put in car and sent home w/ family.

## 2016-10-03 NOTE — ED Notes (Signed)
Pt back from CT scan and x-ray.

## 2016-10-03 NOTE — ED Notes (Signed)
Pt transported to xray 

## 2016-10-04 ENCOUNTER — Encounter (HOSPITAL_BASED_OUTPATIENT_CLINIC_OR_DEPARTMENT_OTHER): Payer: Self-pay | Admitting: *Deleted

## 2016-10-04 ENCOUNTER — Other Ambulatory Visit: Payer: Self-pay | Admitting: Orthopedic Surgery

## 2016-10-04 ENCOUNTER — Telehealth: Payer: Self-pay | Admitting: *Deleted

## 2016-10-04 NOTE — Progress Notes (Signed)
Patient's K+ 2.8 from labs done 10-03-16 in ED. Dr Gifford Shave made aware, will recheck istat DOS
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
## 2016-10-05 ENCOUNTER — Inpatient Hospital Stay (HOSPITAL_COMMUNITY)
Admission: AD | Admit: 2016-10-05 | Discharge: 2016-10-10 | DRG: 510 | Disposition: A | Discharge status: Skilled Nursing Facility | Payer: Medicare Other | Source: Other Acute Inpatient Hospital | Attending: Internal Medicine | Admitting: Internal Medicine

## 2016-10-05 ENCOUNTER — Inpatient Hospital Stay (HOSPITAL_COMMUNITY): Payer: Medicare Other

## 2016-10-05 ENCOUNTER — Encounter (HOSPITAL_COMMUNITY): Payer: Self-pay

## 2016-10-05 DIAGNOSIS — Z8673 Personal history of transient ischemic attack (TIA), and cerebral infarction without residual deficits: Secondary | ICD-10-CM

## 2016-10-05 DIAGNOSIS — J9811 Atelectasis: Secondary | ICD-10-CM | POA: Diagnosis not present

## 2016-10-05 DIAGNOSIS — S5291XA Unspecified fracture of right forearm, initial encounter for closed fracture: Secondary | ICD-10-CM | POA: Diagnosis present

## 2016-10-05 DIAGNOSIS — Z9842 Cataract extraction status, left eye: Secondary | ICD-10-CM

## 2016-10-05 DIAGNOSIS — Z9851 Tubal ligation status: Secondary | ICD-10-CM

## 2016-10-05 DIAGNOSIS — E876 Hypokalemia: Secondary | ICD-10-CM | POA: Diagnosis present

## 2016-10-05 DIAGNOSIS — N390 Urinary tract infection, site not specified: Secondary | ICD-10-CM | POA: Diagnosis present

## 2016-10-05 DIAGNOSIS — M549 Dorsalgia, unspecified: Secondary | ICD-10-CM | POA: Diagnosis present

## 2016-10-05 DIAGNOSIS — F17200 Nicotine dependence, unspecified, uncomplicated: Secondary | ICD-10-CM | POA: Diagnosis present

## 2016-10-05 DIAGNOSIS — D62 Acute posthemorrhagic anemia: Secondary | ICD-10-CM | POA: Diagnosis not present

## 2016-10-05 DIAGNOSIS — Z961 Presence of intraocular lens: Secondary | ICD-10-CM | POA: Diagnosis present

## 2016-10-05 DIAGNOSIS — K219 Gastro-esophageal reflux disease without esophagitis: Secondary | ICD-10-CM | POA: Diagnosis present

## 2016-10-05 DIAGNOSIS — M25562 Pain in left knee: Secondary | ICD-10-CM

## 2016-10-05 DIAGNOSIS — S52101G Unspecified fracture of upper end of right radius, subsequent encounter for closed fracture with delayed healing: Secondary | ICD-10-CM

## 2016-10-05 DIAGNOSIS — S52101A Unspecified fracture of upper end of right radius, initial encounter for closed fracture: Secondary | ICD-10-CM | POA: Diagnosis not present

## 2016-10-05 DIAGNOSIS — S52371A Galeazzi's fracture of right radius, initial encounter for closed fracture: Secondary | ICD-10-CM | POA: Diagnosis present

## 2016-10-05 DIAGNOSIS — G8929 Other chronic pain: Secondary | ICD-10-CM | POA: Diagnosis present

## 2016-10-05 DIAGNOSIS — J449 Chronic obstructive pulmonary disease, unspecified: Secondary | ICD-10-CM | POA: Diagnosis present

## 2016-10-05 DIAGNOSIS — G4730 Sleep apnea, unspecified: Secondary | ICD-10-CM | POA: Diagnosis present

## 2016-10-05 DIAGNOSIS — N179 Acute kidney failure, unspecified: Secondary | ICD-10-CM | POA: Diagnosis present

## 2016-10-05 DIAGNOSIS — F17210 Nicotine dependence, cigarettes, uncomplicated: Secondary | ICD-10-CM | POA: Diagnosis present

## 2016-10-05 DIAGNOSIS — S52501D Unspecified fracture of the lower end of right radius, subsequent encounter for closed fracture with routine healing: Secondary | ICD-10-CM

## 2016-10-05 DIAGNOSIS — Z8261 Family history of arthritis: Secondary | ICD-10-CM

## 2016-10-05 DIAGNOSIS — M109 Gout, unspecified: Secondary | ICD-10-CM | POA: Diagnosis present

## 2016-10-05 DIAGNOSIS — M1990 Unspecified osteoarthritis, unspecified site: Secondary | ICD-10-CM | POA: Diagnosis present

## 2016-10-05 DIAGNOSIS — E039 Hypothyroidism, unspecified: Secondary | ICD-10-CM | POA: Diagnosis present

## 2016-10-05 DIAGNOSIS — S82142A Displaced bicondylar fracture of left tibia, initial encounter for closed fracture: Secondary | ICD-10-CM | POA: Diagnosis present

## 2016-10-05 DIAGNOSIS — F320 Major depressive disorder, single episode, mild: Secondary | ICD-10-CM | POA: Diagnosis not present

## 2016-10-05 DIAGNOSIS — F419 Anxiety disorder, unspecified: Secondary | ICD-10-CM | POA: Diagnosis present

## 2016-10-05 DIAGNOSIS — G9340 Encephalopathy, unspecified: Secondary | ICD-10-CM | POA: Diagnosis present

## 2016-10-05 DIAGNOSIS — Z825 Family history of asthma and other chronic lower respiratory diseases: Secondary | ICD-10-CM

## 2016-10-05 DIAGNOSIS — Z888 Allergy status to other drugs, medicaments and biological substances status: Secondary | ICD-10-CM | POA: Diagnosis not present

## 2016-10-05 DIAGNOSIS — Y92410 Unspecified street and highway as the place of occurrence of the external cause: Secondary | ICD-10-CM | POA: Diagnosis not present

## 2016-10-05 DIAGNOSIS — M797 Fibromyalgia: Secondary | ICD-10-CM | POA: Diagnosis present

## 2016-10-05 DIAGNOSIS — Z716 Tobacco abuse counseling: Secondary | ICD-10-CM

## 2016-10-05 DIAGNOSIS — Z419 Encounter for procedure for purposes other than remedying health state, unspecified: Secondary | ICD-10-CM

## 2016-10-05 DIAGNOSIS — R509 Fever, unspecified: Secondary | ICD-10-CM

## 2016-10-05 DIAGNOSIS — Z9889 Other specified postprocedural states: Secondary | ICD-10-CM

## 2016-10-05 DIAGNOSIS — I10 Essential (primary) hypertension: Secondary | ICD-10-CM | POA: Diagnosis present

## 2016-10-05 DIAGNOSIS — N1831 Chronic kidney disease, stage 3a: Secondary | ICD-10-CM | POA: Diagnosis present

## 2016-10-05 DIAGNOSIS — Z86010 Personal history of colonic polyps: Secondary | ICD-10-CM | POA: Diagnosis not present

## 2016-10-05 DIAGNOSIS — E785 Hyperlipidemia, unspecified: Secondary | ICD-10-CM | POA: Diagnosis present

## 2016-10-05 DIAGNOSIS — D803 Selective deficiency of immunoglobulin G [IgG] subclasses: Secondary | ICD-10-CM | POA: Diagnosis present

## 2016-10-05 DIAGNOSIS — F329 Major depressive disorder, single episode, unspecified: Secondary | ICD-10-CM | POA: Diagnosis present

## 2016-10-05 DIAGNOSIS — M25552 Pain in left hip: Secondary | ICD-10-CM

## 2016-10-05 DIAGNOSIS — F32A Depression, unspecified: Secondary | ICD-10-CM | POA: Diagnosis present

## 2016-10-05 DIAGNOSIS — Z79899 Other long term (current) drug therapy: Secondary | ICD-10-CM

## 2016-10-05 DIAGNOSIS — M810 Age-related osteoporosis without current pathological fracture: Secondary | ICD-10-CM | POA: Diagnosis present

## 2016-10-05 HISTORY — DX: Acute kidney failure, unspecified: N17.9

## 2016-10-05 HISTORY — DX: Unspecified fracture of right forearm, initial encounter for closed fracture: S52.91XA

## 2016-10-05 HISTORY — DX: Encephalopathy, unspecified: G93.40

## 2016-10-05 HISTORY — DX: Hypokalemia: E87.6

## 2016-10-05 HISTORY — DX: Urinary tract infection, site not specified: N39.0

## 2016-10-05 LAB — CBC
HCT: 28.7 % — ABNORMAL LOW (ref 36.0–46.0)
Hemoglobin: 9.5 g/dL — ABNORMAL LOW (ref 12.0–15.0)
MCH: 28.2 pg (ref 26.0–34.0)
MCHC: 33.1 g/dL (ref 30.0–36.0)
MCV: 85.2 fL (ref 78.0–100.0)
PLATELETS: 227 10*3/uL (ref 150–400)
RBC: 3.37 MIL/uL — ABNORMAL LOW (ref 3.87–5.11)
RDW: 15 % (ref 11.5–15.5)
WBC: 11.5 10*3/uL — AB (ref 4.0–10.5)

## 2016-10-05 LAB — BASIC METABOLIC PANEL
Anion gap: 12 (ref 5–15)
BUN: 19 mg/dL (ref 6–20)
CHLORIDE: 100 mmol/L — AB (ref 101–111)
CO2: 23 mmol/L (ref 22–32)
CREATININE: 1.04 mg/dL — AB (ref 0.44–1.00)
Calcium: 8.2 mg/dL — ABNORMAL LOW (ref 8.9–10.3)
GFR calc Af Amer: >60 mL/min (ref >60)
GFR calc non Af Amer: 54 mL/min — ABNORMAL LOW (ref >60)
Glucose, Bld: 103 mg/dL — ABNORMAL HIGH (ref 65–99)
Potassium: 3.4 mmol/L — ABNORMAL LOW (ref 3.5–5.1)
SODIUM: 135 mmol/L (ref 135–145)

## 2016-10-05 LAB — MAGNESIUM: MAGNESIUM: 2 mg/dL (ref 1.7–2.4)

## 2016-10-05 LAB — GLUCOSE, CAPILLARY: GLUCOSE-CAPILLARY: 70 mg/dL (ref 65–99)

## 2016-10-05 LAB — CREATININE, URINE, RANDOM: Creatinine, Urine: 57.73 mg/dL

## 2016-10-05 MED ORDER — ENOXAPARIN SODIUM 30 MG/0.3ML ~~LOC~~ SOLN: 30.0000 mg | Freq: Every day | SUBCUTANEOUS | Status: DC

## 2016-10-05 MED ORDER — ENOXAPARIN SODIUM 30 MG/0.3ML ~~LOC~~ SOLN
30.0000 mg | Freq: Every day | SUBCUTANEOUS | Status: DC
  Administered 2016-10-05: 30 mg via SUBCUTANEOUS
  Filled 2016-10-05: qty 0.3

## 2016-10-05 MED ORDER — OXYCODONE HCL 5 MG PO TABS
5.0000 mg | ORAL_TABLET | Freq: Four times a day (QID) | ORAL | Status: DC | PRN
  Administered 2016-10-05: 5 mg via ORAL
  Filled 2016-10-05: qty 1

## 2016-10-05 MED ORDER — SIMVASTATIN 20 MG PO TABS
20.0000 mg | ORAL_TABLET | Freq: Every day | ORAL | Status: DC
Start: 2016-10-05 — End: 2016-10-10
  Administered 2016-10-05 – 2016-10-10 (×5): 20 mg via ORAL
  Filled 2016-10-05 (×5): qty 1

## 2016-10-05 MED ORDER — ALBUTEROL SULFATE (2.5 MG/3ML) 0.083% IN NEBU: 2.5000 mg | INHALATION_SOLUTION | RESPIRATORY_TRACT | Status: DC | PRN

## 2016-10-05 MED ORDER — POLYSACCHARIDE IRON COMPLEX 150 MG PO CAPS
150.0000 mg | ORAL_CAPSULE | Freq: Every day | ORAL | Status: DC
  Administered 2016-10-05 – 2016-10-10 (×5): 150 mg via ORAL
  Filled 2016-10-05 (×5): qty 1

## 2016-10-05 MED ORDER — ALPRAZOLAM 0.5 MG PO TABS
0.5000 mg | ORAL_TABLET | Freq: Three times a day (TID) | ORAL | Status: DC | PRN
  Administered 2016-10-06 – 2016-10-09 (×4): 0.5 mg via ORAL
  Filled 2016-10-05 (×4): qty 1

## 2016-10-05 MED ORDER — HYDRALAZINE HCL 20 MG/ML IJ SOLN: 5.0000 mg | INTRAMUSCULAR | Status: DC | PRN

## 2016-10-05 MED ORDER — RISAQUAD PO CAPS
1.0000 | ORAL_CAPSULE | Freq: Every day | ORAL | Status: DC
  Administered 2016-10-05 – 2016-10-10 (×5): 1 via ORAL
  Filled 2016-10-05 (×7): qty 1

## 2016-10-05 MED ORDER — TIZANIDINE HCL 4 MG PO TABS
4.0000 mg | ORAL_TABLET | Freq: Four times a day (QID) | ORAL | Status: DC | PRN
  Administered 2016-10-05 – 2016-10-10 (×11): 4 mg via ORAL
  Filled 2016-10-05 (×11): qty 1

## 2016-10-05 MED ORDER — OXYCODONE HCL 5 MG PO TABS
5.0000 mg | ORAL_TABLET | ORAL | Status: DC | PRN
  Administered 2016-10-05: 5 mg via ORAL
  Filled 2016-10-05 (×2): qty 1

## 2016-10-05 MED ORDER — SILVER SULFADIAZINE 1 % EX CREA
TOPICAL_CREAM | Freq: Every day | CUTANEOUS | Status: DC
  Administered 2016-10-05 – 2016-10-10 (×6): via TOPICAL
  Filled 2016-10-05: qty 85

## 2016-10-05 MED ORDER — NICOTINE 21 MG/24HR TD PT24
21.0000 mg | MEDICATED_PATCH | Freq: Every day | TRANSDERMAL | Status: DC
  Administered 2016-10-05 – 2016-10-10 (×5): 21 mg via TRANSDERMAL
  Filled 2016-10-05 (×6): qty 1

## 2016-10-05 MED ORDER — POTASSIUM CHLORIDE CRYS ER 20 MEQ PO TBCR
40.0000 meq | EXTENDED_RELEASE_TABLET | Freq: Once | ORAL | Status: AC
  Administered 2016-10-05: 40 meq via ORAL
  Filled 2016-10-05: qty 2

## 2016-10-05 MED ORDER — MECLIZINE HCL 25 MG PO TABS: 25.0000 mg | ORAL_TABLET | Freq: Three times a day (TID) | ORAL | Status: DC | PRN

## 2016-10-05 MED ORDER — POLYETHYLENE GLYCOL 3350 17 G PO PACK
17.0000 g | PACK | Freq: Every day | ORAL | Status: DC
  Administered 2016-10-05 – 2016-10-10 (×5): 17 g via ORAL
  Filled 2016-10-05 (×5): qty 1

## 2016-10-05 MED ORDER — ALPRAZOLAM 0.5 MG PO TABS
1.0000 mg | ORAL_TABLET | Freq: Three times a day (TID) | ORAL | Status: DC | PRN
  Administered 2016-10-05: 1 mg via ORAL
  Filled 2016-10-05: qty 2

## 2016-10-05 MED ORDER — BUPROPION HCL ER (XL) 150 MG PO TB24: 300.0000 mg | ORAL_TABLET | Freq: Every day | ORAL | Status: DC

## 2016-10-05 MED ORDER — OXYCODONE HCL 5 MG PO TABS
5.0000 mg | ORAL_TABLET | Freq: Once | ORAL | Status: AC
  Administered 2016-10-05: 5 mg via ORAL
  Filled 2016-10-05: qty 1

## 2016-10-05 MED ORDER — ALLOPURINOL 100 MG PO TABS
100.0000 mg | ORAL_TABLET | Freq: Every day | ORAL | Status: DC
  Administered 2016-10-05 – 2016-10-10 (×5): 100 mg via ORAL
  Filled 2016-10-05 (×6): qty 1

## 2016-10-05 MED ORDER — SODIUM CHLORIDE 0.9 % IV SOLN
INTRAVENOUS | Status: DC
  Administered 2016-10-05 – 2016-10-07 (×4): via INTRAVENOUS

## 2016-10-05 MED ORDER — NALOXEGOL OXALATE 12.5 MG PO TABS
12.5000 mg | ORAL_TABLET | Freq: Every day | ORAL | Status: DC
  Administered 2016-10-05 – 2016-10-06 (×2): 12.5 mg via ORAL
  Filled 2016-10-05 (×4): qty 1

## 2016-10-05 MED ORDER — FAMOTIDINE 20 MG PO TABS
40.0000 mg | ORAL_TABLET | Freq: Every day | ORAL | Status: DC
  Administered 2016-10-05 – 2016-10-10 (×5): 40 mg via ORAL
  Filled 2016-10-05 (×5): qty 2

## 2016-10-05 MED ORDER — ENOXAPARIN SODIUM 40 MG/0.4ML ~~LOC~~ SOLN
40.0000 mg | Freq: Every day | SUBCUTANEOUS | Status: AC
  Administered 2016-10-06: 40 mg via SUBCUTANEOUS
  Filled 2016-10-05: qty 0.4

## 2016-10-05 MED ORDER — CALCIUM CARBONATE-VITAMIN D 500-200 MG-UNIT PO TABS
1.0000 | ORAL_TABLET | Freq: Every day | ORAL | Status: DC
  Administered 2016-10-05 – 2016-10-10 (×5): 1 via ORAL
  Filled 2016-10-05 (×5): qty 1

## 2016-10-05 MED ORDER — SODIUM CHLORIDE 0.9% FLUSH
3.0000 mL | Freq: Two times a day (BID) | INTRAVENOUS | Status: DC
  Administered 2016-10-05 – 2016-10-10 (×9): 3 mL via INTRAVENOUS

## 2016-10-05 MED ORDER — OXYCODONE HCL 5 MG PO TABS
10.0000 mg | ORAL_TABLET | Freq: Four times a day (QID) | ORAL | Status: DC | PRN
  Administered 2016-10-05 – 2016-10-10 (×16): 10 mg via ORAL
  Filled 2016-10-05 (×16): qty 2

## 2016-10-05 MED ORDER — AMMONIUM LACTATE 12 % EX LOTN: 1.0000 "application " | TOPICAL_LOTION | CUTANEOUS | Status: DC | PRN

## 2016-10-05 MED ORDER — MILNACIPRAN HCL 50 MG PO TABS
50.0000 mg | ORAL_TABLET | Freq: Two times a day (BID) | ORAL | Status: DC
  Administered 2016-10-05 – 2016-10-10 (×10): 50 mg via ORAL
  Filled 2016-10-05 (×11): qty 1

## 2016-10-05 MED ORDER — LEVOTHYROXINE SODIUM 75 MCG PO TABS
75.0000 ug | ORAL_TABLET | Freq: Every day | ORAL | Status: DC
  Administered 2016-10-05 – 2016-10-10 (×5): 75 ug via ORAL
  Filled 2016-10-05 (×5): qty 1

## 2016-10-05 MED ORDER — DEXTROSE 5 % IV SOLN
1.0000 g | Freq: Every day | INTRAVENOUS | Status: DC
  Administered 2016-10-05 – 2016-10-09 (×6): 1 g via INTRAVENOUS
  Filled 2016-10-05 (×7): qty 10

## 2016-10-05 MED ORDER — ZOLPIDEM TARTRATE 5 MG PO TABS: 5.0000 mg | ORAL_TABLET | Freq: Every day | ORAL | Status: DC
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
## 2016-10-05 NOTE — Progress Notes (Signed)
Pt was ordered NPO. She insisted on having water because her mouth is dry. Pt was educated. Will continue to monitor.

## 2016-10-06 ENCOUNTER — Encounter (HOSPITAL_COMMUNITY): Payer: Self-pay

## 2016-10-06 LAB — COMPREHENSIVE METABOLIC PANEL
ALT: 29 U/L (ref 14–54)
ANION GAP: 9 (ref 5–15)
AST: 51 U/L — AB (ref 15–41)
Albumin: 2.6 g/dL — ABNORMAL LOW (ref 3.5–5.0)
Alkaline Phosphatase: 62 U/L (ref 38–126)
BILIRUBIN TOTAL: 0.5 mg/dL (ref 0.3–1.2)
BUN: 14 mg/dL (ref 6–20)
CHLORIDE: 102 mmol/L (ref 101–111)
CO2: 22 mmol/L (ref 22–32)
Calcium: 8.2 mg/dL — ABNORMAL LOW (ref 8.9–10.3)
Creatinine, Ser: 0.98 mg/dL (ref 0.44–1.00)
GFR calc Af Amer: >60 mL/min (ref >60)
GFR, EST NON AFRICAN AMERICAN: 58 mL/min — AB (ref >60)
Glucose, Bld: 90 mg/dL (ref 65–99)
POTASSIUM: 3.9 mmol/L (ref 3.5–5.1)
Sodium: 133 mmol/L — ABNORMAL LOW (ref 135–145)
TOTAL PROTEIN: 4.9 g/dL — AB (ref 6.5–8.1)

## 2016-10-06 LAB — CBC
HEMATOCRIT: 24.1 % — AB (ref 36.0–46.0)
Hemoglobin: 8 g/dL — ABNORMAL LOW (ref 12.0–15.0)
MCH: 28.5 pg (ref 26.0–34.0)
MCHC: 33.2 g/dL (ref 30.0–36.0)
MCV: 85.8 fL (ref 78.0–100.0)
PLATELETS: 229 10*3/uL (ref 150–400)
RBC: 2.81 MIL/uL — ABNORMAL LOW (ref 3.87–5.11)
RDW: 15.2 % (ref 11.5–15.5)
WBC: 9.3 10*3/uL (ref 4.0–10.5)

## 2016-10-06 LAB — URINE CULTURE: Culture: NO GROWTH

## 2016-10-06 LAB — UREA NITROGEN, URINE: UREA NITROGEN UR: 415 mg/dL

## 2016-10-06 LAB — GLUCOSE, CAPILLARY: GLUCOSE-CAPILLARY: 94 mg/dL (ref 65–99)

## 2016-10-06 LAB — SURGICAL PCR SCREEN
MRSA, PCR: NEGATIVE
STAPHYLOCOCCUS AUREUS: NEGATIVE
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
## 2016-10-06 NOTE — Clinical Social Work Placement (Signed)
   CLINICAL SOCIAL WORK PLACEMENT  NOTE  Date:  10/06/2016  Patient Details  Name: Samantha Fletcher MRN: [id number] Date of Birth: [birth year]  Clinical Social Work is seeking post-discharge placement for this patient at the [place] level of care (*CSW will initial, date and re-position this form in  chart as items are completed):  Yes   Patient/family provided with [facility] Work Department's list of facilities offering this level of care within the geographic area requested by the patient (or if unable, by the patient's family).  Yes   Patient/family informed of their freedom to choose among providers that offer the needed level of care, that participate in Medicare, Medicaid or managed care program needed by the patient, have an available bed and are willing to accept the patient.  Yes   Patient/family informed of [facility]'s ownership interest in [facility] and [facility], as well as of the fact that they are under no obligation to receive care at these facilities.  PASRR submitted to EDS on 10/06/16     PASRR number received on       Existing PASRR number confirmed on 10/06/16     FL2 transmitted to all facilities in geographic area requested by pt/family on 10/06/16     FL2 transmitted to all facilities within larger geographic area on       Patient informed that his/her managed care company has contracts with or will negotiate with certain facilities, including the following:            Patient/family informed of bed offers received.  Patient chooses bed at       Physician recommends and patient chooses bed at      Patient to be transferred to   on  .  Patient to be transferred to facility by       Patient family notified on   of transfer.  Name of family member notified:        PHYSICIAN Please sign FL2     Additional Comment:    _______________________________________________ Glendon Axe A 10/06/2016, 4:03  PM
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
## 2016-10-06 NOTE — Anesthesia Preprocedure Evaluation (Addendum)
Anesthesia Evaluation  Patient identified by MRN, date of birth, ID band Patient awake    Reviewed: Allergy & Precautions, NPO status , Patient's Chart, lab work & pertinent test results  History of Anesthesia Complications Negative for: history of anesthetic complications  Airway Mallampati: I  TM Distance: >3 FB Neck ROM: Full    Dental  (+) Edentulous Upper, Missing, Upper Dentures, Dental Advisory Given   Pulmonary sleep apnea , COPD, Current Smoker,    + rhonchi        Cardiovascular hypertension, + Peripheral Vascular Disease  Normal cardiovascular exam     Neuro/Psych PSYCHIATRIC DISORDERS Anxiety Depression TIA   GI/Hepatic Neg liver ROS, GERD  ,  Endo/Other  Hypothyroidism   Renal/GU negative Renal ROS     Musculoskeletal  (+) Fibromyalgia -  Abdominal   Peds  Hematology   Anesthesia Other Findings   Reproductive/Obstetrics                            Anesthesia Physical Anesthesia Plan  ASA: III  Anesthesia Plan: General   Post-op Pain Management: GA combined w/ Regional for post-op pain   Induction: Intravenous  Airway Management Planned: LMA  Additional Equipment:   Intra-op Plan:   Post-operative Plan: Extubation in OR  Informed Consent: I have reviewed the patients History and Physical, chart, labs and discussed the procedure including the risks, benefits and alternatives for the proposed anesthesia with the patient or authorized representative who has indicated his/her understanding and acceptance.   Dental advisory given  Plan Discussed with: CRNA  Anesthesia Plan Comments:        Anesthesia Quick Evaluation

## 2016-10-06 NOTE — Progress Notes (Signed)
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 Mages.  Shick M.D.   On: 10/05/2016 10:59      No results found for: HGBA1C Lab Results  Component Value Date   CREATININE 0.98 10/06/2016       Scheduled Meds: . acidophilus  1 capsule Oral Daily  . allopurinol  100 mg Oral Daily  . calcium-vitamin D  1 tablet Oral Q breakfast  . cefTRIAXone (ROCEPHIN)  IV  1 g Intravenous QHS  . famotidine  40 mg Oral Daily  . iron polysaccharides  150 mg Oral Daily  . levothyroxine  75 mcg Oral QAC breakfast  . Milnacipran  50 mg Oral BID  . naloxegol oxalate  12.5 mg Oral Daily  . nicotine  21 mg Transdermal Daily  . polyethylene glycol  17 g Oral Daily  . silver sulfADIAZINE   Topical Daily  . simvastatin  20 mg Oral Daily  . sodium chloride flush  3 mL Intravenous Q12H   Continuous Infusions: . sodium chloride 75 mL/hr at 10/06/16 0436     LOS: 1 day    Time spent: >30 MINS    [facility]  [facility] Pager [phone]. If 7PM-7AM, please contact night-coverage at [email or web address], password [facility] 10/06/2016,  9:31 AM  LOS: 1 day
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
## 2016-10-06 NOTE — Clinical Social Work Note (Signed)
Clinical Social Work Assessment  Patient Details  Name: Samantha Fletcher MRN: [id number] Date of Birth: 1946/07/16  Date of referral:  10/06/16               Reason for consult:  Facility Placement, Discharge Planning                Permission sought to share information with:  Family Supports, Case Manager Permission granted to share information::  Yes, Verbal Permission Granted  Name::      Barnetta Hammersmith )  Agency::   (SNF's )  Relationship::   (Sister )  Contact Information:   [phone])  Housing/Transportation Living arrangements for the past 2 months:  Single Family Home Source of Information:  Patient Patient Interpreter Needed:  None Criminal Activity/Legal Involvement Pertinent to Current Situation/Hospitalization:  No - Comment as needed Significant Relationships:  Siblings Lives with:  Self Do you feel safe going back to the place where you live?  No Need for family participation in patient care:  Yes (Comment)  Care giving concerns:  Patient admitted from home alone and will need SNF before returning.    Social Worker assessment / plan: MSW met with patient in reference to post-acute placement for SNF. MSW introduced MSW role and SNF process. Patient stated that she has been a ST resident of [place] in the past and wishes to return for STR. No further concerns reported at this time. MSW remains available as needed.   Employment status:  Retired Nurse, adult PT Recommendations:  [place] / Referral to community resources:  [place]  Patient/Family's Response to care:  Pt agreeable to SNF placement and prefers [facility] in [place].   Patient/Family's Understanding of and Emotional Response to Diagnosis, Current Treatment, and Prognosis:  Unsure of patient's understanding of medical interventions.   Emotional Assessment Appearance:  Appears stated  age Attitude/Demeanor/Rapport:  Unable to Assess Affect (typically observed):  Unable to Assess Orientation:  Oriented to Self, Oriented to Place, Oriented to  Time, Oriented to Situation Alcohol / Substance use:  Not Applicable Psych involvement (Current and /or in the community):  No (Comment)  Discharge Needs  Concerns to be addressed:  Care Coordination Readmission within the last 30 days:  No Current discharge risk:  Dependent with Mobility Barriers to Discharge:  Continued Medical Work up   [place] 10/06/2016, 4:00 PM

## 2016-10-07 ENCOUNTER — Encounter (HOSPITAL_COMMUNITY)
Admission: AD | Disposition: A | Discharge status: Skilled Nursing Facility | Payer: Self-pay | Source: Other Acute Inpatient Hospital | Attending: Internal Medicine

## 2016-10-07 ENCOUNTER — Ambulatory Visit (HOSPITAL_BASED_OUTPATIENT_CLINIC_OR_DEPARTMENT_OTHER): Admission: RE | Admit: 2016-10-07 | Payer: Medicare Other | Source: Ambulatory Visit | Admitting: Orthopedic Surgery

## 2016-10-07 ENCOUNTER — Inpatient Hospital Stay (HOSPITAL_COMMUNITY): Payer: Medicare Other

## 2016-10-07 ENCOUNTER — Inpatient Hospital Stay (HOSPITAL_COMMUNITY): Payer: Medicare Other | Admitting: Anesthesiology

## 2016-10-07 HISTORY — PX: ORIF RADIAL FRACTURE: SHX5113

## 2016-10-07 HISTORY — DX: Dorsalgia, unspecified: M54.9

## 2016-10-07 HISTORY — DX: Other chronic pain: G89.29

## 2016-10-07 LAB — COMPREHENSIVE METABOLIC PANEL
ALT: 31 U/L (ref 14–54)
AST: 44 U/L — AB (ref 15–41)
Albumin: 2.4 g/dL — ABNORMAL LOW (ref 3.5–5.0)
Alkaline Phosphatase: 64 U/L (ref 38–126)
Anion gap: 9 (ref 5–15)
BUN: 10 mg/dL (ref 6–20)
CHLORIDE: 103 mmol/L (ref 101–111)
CO2: 22 mmol/L (ref 22–32)
CREATININE: 0.8 mg/dL (ref 0.44–1.00)
Calcium: 8 mg/dL — ABNORMAL LOW (ref 8.9–10.3)
GFR calc non Af Amer: >60 mL/min (ref >60)
Glucose, Bld: 112 mg/dL — ABNORMAL HIGH (ref 65–99)
POTASSIUM: 3.3 mmol/L — AB (ref 3.5–5.1)
SODIUM: 134 mmol/L — AB (ref 135–145)
Total Bilirubin: 0.3 mg/dL (ref 0.3–1.2)
Total Protein: 5 g/dL — ABNORMAL LOW (ref 6.5–8.1)

## 2016-10-07 LAB — CBC
HEMATOCRIT: 23.5 % — AB (ref 36.0–46.0)
HEMOGLOBIN: 7.8 g/dL — AB (ref 12.0–15.0)
MCH: 28.4 pg (ref 26.0–34.0)
MCHC: 33.2 g/dL (ref 30.0–36.0)
MCV: 85.5 fL (ref 78.0–100.0)
PLATELETS: 254 10*3/uL (ref 150–400)
RBC: 2.75 MIL/uL — AB (ref 3.87–5.11)
RDW: 15 % (ref 11.5–15.5)
WBC: 7 10*3/uL (ref 4.0–10.5)

## 2016-10-07 SURGERY — OPEN REDUCTION INTERNAL FIXATION (ORIF) RADIAL FRACTURE
Anesthesia: General | Laterality: Right

## 2016-10-07 MED ORDER — HYDROMORPHONE HCL 1 MG/ML IJ SOLN: 0.2500 mg | INTRAMUSCULAR | Status: DC | PRN

## 2016-10-07 MED ORDER — SCOPOLAMINE 1 MG/3DAYS TD PT72
1.0000 | MEDICATED_PATCH | Freq: Once | TRANSDERMAL | Status: DC | PRN
Start: 2016-10-07 — End: 2016-10-07
  Filled 2016-10-07: qty 1

## 2016-10-07 MED ORDER — PROMETHAZINE HCL 25 MG/ML IJ SOLN: 6.2500 mg | INTRAMUSCULAR | Status: DC | PRN

## 2016-10-07 MED ORDER — FENTANYL CITRATE (PF) 100 MCG/2ML IJ SOLN
INTRAMUSCULAR | Status: AC
  Filled 2016-10-07: qty 2

## 2016-10-07 MED ORDER — CEFAZOLIN SODIUM-DEXTROSE 2-4 GM/100ML-% IV SOLN: 2.0000 g | INTRAVENOUS | Status: DC

## 2016-10-07 MED ORDER — LACTATED RINGERS IV SOLN
INTRAVENOUS | Status: DC | PRN
Start: 2016-10-07 — End: 2016-10-07
  Administered 2016-10-07: 07:00:00 via INTRAVENOUS

## 2016-10-07 MED ORDER — FENTANYL CITRATE (PF) 100 MCG/2ML IJ SOLN
INTRAMUSCULAR | Status: DC | PRN
  Administered 2016-10-07: 50 ug via INTRAVENOUS
  Administered 2016-10-07 (×2): 25 ug via INTRAVENOUS

## 2016-10-07 MED ORDER — MIDAZOLAM HCL 2 MG/2ML IJ SOLN: 1.0000 mg | INTRAMUSCULAR | Status: DC | PRN

## 2016-10-07 MED ORDER — ENOXAPARIN SODIUM 40 MG/0.4ML ~~LOC~~ SOLN
40.0000 mg | SUBCUTANEOUS | Status: DC
  Administered 2016-10-08 – 2016-10-10 (×3): 40 mg via SUBCUTANEOUS
  Filled 2016-10-07 (×3): qty 0.4

## 2016-10-07 MED ORDER — LACTATED RINGERS IV SOLN: INTRAVENOUS | Status: DC

## 2016-10-07 MED ORDER — BUPIVACAINE-EPINEPHRINE (PF) 0.5% -1:200000 IJ SOLN
INTRAMUSCULAR | Status: DC | PRN
  Administered 2016-10-07: 30 mL via PERINEURAL

## 2016-10-07 MED ORDER — CEFAZOLIN SODIUM-DEXTROSE 2-3 GM-% IV SOLR
INTRAVENOUS | Status: DC | PRN
  Administered 2016-10-07: 2 g via INTRAVENOUS

## 2016-10-07 MED ORDER — PHENOL 1.4 % MT LIQD
1.0000 | OROMUCOSAL | Status: DC | PRN
  Administered 2016-10-07: 1 via OROMUCOSAL
  Filled 2016-10-07: qty 177

## 2016-10-07 MED ORDER — POTASSIUM CHLORIDE CRYS ER 20 MEQ PO TBCR
40.0000 meq | EXTENDED_RELEASE_TABLET | Freq: Two times a day (BID) | ORAL | Status: AC
  Administered 2016-10-07 (×2): 40 meq via ORAL
  Filled 2016-10-07 (×2): qty 2

## 2016-10-07 MED ORDER — MIDAZOLAM HCL 5 MG/5ML IJ SOLN
INTRAMUSCULAR | Status: DC | PRN
  Administered 2016-10-07 (×2): 1 mg via INTRAVENOUS

## 2016-10-07 MED ORDER — PHENYLEPHRINE HCL 10 MG/ML IJ SOLN
INTRAVENOUS | Status: DC | PRN
  Administered 2016-10-07: 50 ug/min via INTRAVENOUS

## 2016-10-07 MED ORDER — MORPHINE SULFATE (PF) 2 MG/ML IV SOLN
2.0000 mg | Freq: Once | INTRAVENOUS | Status: AC
  Administered 2016-10-07: 2 mg via INTRAVENOUS
  Filled 2016-10-07: qty 1

## 2016-10-07 MED ORDER — ENOXAPARIN SODIUM 40 MG/0.4ML ~~LOC~~ SOLN: 40.0000 mg | SUBCUTANEOUS | Status: DC

## 2016-10-07 MED ORDER — OXYCODONE HCL 5 MG PO TABS
ORAL_TABLET | ORAL | Status: AC
  Filled 2016-10-07: qty 2

## 2016-10-07 MED ORDER — PROPOFOL 10 MG/ML IV BOLUS
INTRAVENOUS | Status: DC | PRN
  Administered 2016-10-07: 150 mg via INTRAVENOUS

## 2016-10-07 MED ORDER — LIDOCAINE HCL (CARDIAC) 20 MG/ML IV SOLN
INTRAVENOUS | Status: DC | PRN
  Administered 2016-10-07: 60 mg via INTRAVENOUS

## 2016-10-07 MED ORDER — BUPIVACAINE HCL (PF) 0.25 % IJ SOLN
INTRAMUSCULAR | Status: AC
  Filled 2016-10-07: qty 30

## 2016-10-07 MED ORDER — FENTANYL CITRATE (PF) 100 MCG/2ML IJ SOLN: 50.0000 ug | INTRAMUSCULAR | Status: DC | PRN

## 2016-10-07 MED ORDER — CEFAZOLIN SODIUM-DEXTROSE 2-4 GM/100ML-% IV SOLN
INTRAVENOUS | Status: AC
  Filled 2016-10-07: qty 100

## 2016-10-07 MED ORDER — ONDANSETRON HCL 4 MG/2ML IJ SOLN
INTRAMUSCULAR | Status: DC | PRN
  Administered 2016-10-07: 4 mg via INTRAVENOUS

## 2016-10-07 MED ORDER — 0.9 % SODIUM CHLORIDE (POUR BTL) OPTIME
TOPICAL | Status: DC | PRN
  Administered 2016-10-07: 1000 mL

## 2016-10-07 MED ORDER — ACETAMINOPHEN 325 MG PO TABS
650.0000 mg | ORAL_TABLET | ORAL | Status: DC | PRN
  Administered 2016-10-07 – 2016-10-09 (×4): 650 mg via ORAL
  Filled 2016-10-07 (×4): qty 2

## 2016-10-07 MED ORDER — PHENYLEPHRINE HCL 10 MG/ML IJ SOLN
INTRAMUSCULAR | Status: DC | PRN
  Administered 2016-10-07 (×2): 40 ug via INTRAVENOUS
  Administered 2016-10-07: 80 ug via INTRAVENOUS

## 2016-10-07 MED ORDER — LIDOCAINE HCL (PF) 1 % IJ SOLN
INTRAMUSCULAR | Status: AC
  Filled 2016-10-07: qty 30

## 2016-10-07 MED ORDER — PROPOFOL 10 MG/ML IV BOLUS
INTRAVENOUS | Status: AC
  Filled 2016-10-07: qty 20

## 2016-10-07 MED ORDER — OXYCODONE HCL 5 MG PO TABS: 5.0000 mg | ORAL_TABLET | Freq: Four times a day (QID) | ORAL | 0 refills | Status: DC | PRN

## 2016-10-07 MED ORDER — MIDAZOLAM HCL 2 MG/2ML IJ SOLN
INTRAMUSCULAR | Status: AC
  Filled 2016-10-07: qty 2

## 2016-10-07 SURGICAL SUPPLY — 56 items
BANDAGE COBAN STERILE 2 (GAUZE/BANDAGES/DRESSINGS) ×2 IMPLANT
BIT DRILL 2.5X2.75 QC CALB (BIT) ×2 IMPLANT
BIT DRILL CALIBRATED 2.7 (BIT) ×1 IMPLANT
BIT DRILL CALIBRATED 2.7MM (BIT) ×1
BLADE SURG 15 STRL LF DISP TIS (BLADE) ×1 IMPLANT
BLADE SURG 15 STRL SS (BLADE) ×6
BNDG CMPR 9X4 STRL LF SNTH (GAUZE/BANDAGES/DRESSINGS) ×1
BNDG COHESIVE 4X5 TAN STRL (GAUZE/BANDAGES/DRESSINGS) ×3 IMPLANT
BNDG ESMARK 4X9 LF (GAUZE/BANDAGES/DRESSINGS) ×3 IMPLANT
BNDG GAUZE ELAST 4 BULKY (GAUZE/BANDAGES/DRESSINGS) ×6 IMPLANT
BRUSH SCRUB EZ PLAIN DRY (MISCELLANEOUS) IMPLANT
CANISTER SUCTION 2500CC (MISCELLANEOUS) ×3 IMPLANT
CHLORAPREP W/TINT 26ML (MISCELLANEOUS) ×3 IMPLANT
CORDS BIPOLAR (ELECTRODE) ×3 IMPLANT
COVER SURGICAL LIGHT HANDLE (MISCELLANEOUS) ×3 IMPLANT
COVER TABLE BACK 60X90 (DRAPES) ×3 IMPLANT
CUFF TOURNIQUET SINGLE 18IN (TOURNIQUET CUFF) ×2 IMPLANT
CUFF TOURNIQUET SINGLE 24IN (TOURNIQUET CUFF) IMPLANT
DRAPE C-ARM 42X72 X-RAY (DRAPES) ×3 IMPLANT
DRAPE SURG 17X23 STRL (DRAPES) ×3 IMPLANT
DRSG ADAPTIC 3X8 NADH LF (GAUZE/BANDAGES/DRESSINGS) ×3 IMPLANT
DRSG EMULSION OIL 3X3 NADH (GAUZE/BANDAGES/DRESSINGS) IMPLANT
GAUZE SPONGE 4X4 12PLY STRL (GAUZE/BANDAGES/DRESSINGS) ×3 IMPLANT
GLOVE BIO SURGEON STRL SZ7.5 (GLOVE) ×3 IMPLANT
GLOVE BIOGEL PI IND STRL 8 (GLOVE) ×1 IMPLANT
GLOVE BIOGEL PI INDICATOR 8 (GLOVE) ×2
GOWN STRL REUS W/ TWL XL LVL3 (GOWN DISPOSABLE) ×1 IMPLANT
GOWN STRL REUS W/TWL XL LVL3 (GOWN DISPOSABLE) ×3
KIT BASIN OR (CUSTOM PROCEDURE TRAY) ×3 IMPLANT
NDL HYPO 25X1 1.5 SAFETY (NEEDLE) IMPLANT
NEEDLE HYPO 22GX1.5 SAFETY (NEEDLE) ×3 IMPLANT
NEEDLE HYPO 25X1 1.5 SAFETY (NEEDLE) IMPLANT
NS IRRIG 1000ML POUR BTL (IV SOLUTION) ×3 IMPLANT
PACK ORTHO EXTREMITY (CUSTOM PROCEDURE TRAY) ×3 IMPLANT
PAD CAST 4YDX4 CTTN HI CHSV (CAST SUPPLIES) ×1 IMPLANT
PADDING CAST ABS 4INX4YD NS (CAST SUPPLIES) ×2
PADDING CAST ABS COTTON 4X4 ST (CAST SUPPLIES) IMPLANT
PADDING CAST COTTON 4X4 STRL (CAST SUPPLIES) ×3
PADDING CAST COTTON 6X4 STRL (CAST SUPPLIES) ×2 IMPLANT
PENCIL BUTTON HOLSTER BLD 10FT (ELECTRODE) ×2 IMPLANT
PLATE LOCK COMP 8H 3.5 FOOT (Plate) ×2 IMPLANT
RUBBERBAND STERILE (MISCELLANEOUS) ×2 IMPLANT
SCREW CORTICAL 3.5MM  16MM (Screw) ×8 IMPLANT
SCREW CORTICAL 3.5MM 16MM (Screw) IMPLANT
SCREW LOCK CORT STAR 3.5X10 (Screw) ×4 IMPLANT
SCREW LOCK CORT STAR 3.5X12 (Screw) ×4 IMPLANT
STAPLER VISISTAT 35W (STAPLE) ×2 IMPLANT
SUT VIC AB 2-0 CT3 27 (SUTURE) ×1 IMPLANT
SUT VIC AB 2-0 FS1 27 (SUTURE) ×2 IMPLANT
SUT VICRYL 4-0 PS2 18IN ABS (SUTURE) IMPLANT
SUT VICRYL RAPIDE 4/0 PS 2 (SUTURE) ×1 IMPLANT
SYRINGE 10CC LL (SYRINGE) ×2 IMPLANT
TOWEL OR 17X24 6PK STRL BLUE (TOWEL DISPOSABLE) ×3 IMPLANT
TUBE CONNECTING 12'X1/4 (SUCTIONS) ×1
TUBE CONNECTING 12X1/4 (SUCTIONS) ×2 IMPLANT
UNDERPAD 30X30 (UNDERPADS AND DIAPERS) ×3 IMPLANT

## 2016-10-07 NOTE — Progress Notes (Signed)
OT Cancellation    10/07/16 0800  OT Visit Information  Last OT Received On 10/07/16  Reason Eval/Treat Not Completed Patient at procedure or test/ unavailable  [facility], OTR/L  [id number] 10/07/2016

## 2016-10-07 NOTE — Op Note (Signed)
10/05/2016 - 10/07/2016  8:45 AM  PATIENT:  Samantha Fletcher  70 y.o. female  PRE-OPERATIVE DIAGNOSIS:  Right Galeazzi fracture  POST-OPERATIVE DIAGNOSIS:  Same  PROCEDURE:  ORIF right Galeazzi fracture of the radius  SURGEON: Rayvon Char. [place] Silos, MD  PHYSICIAN ASSISTANT: None  ANESTHESIA:  regional and general  SPECIMENS:  None  DRAINS: None  EBL:  less than 50 mL  PREOPERATIVE INDICATIONS:  Samantha Fletcher is a  70 y.o. female with a closed right Galeazzi fracture.  The risks benefits and alternatives were discussed with the patient preoperatively including but not limited to the risks of infection, bleeding, nerve injury, cardiopulmonary complications, the need for revision surgery, among others, and the patient verbalized understanding and consented to proceed.  OPERATIVE IMPLANTS: Biomet 3.5 mm plate/screws, 8 hole  OPERATIVE PROCEDURE: After receiving prophylactic antibiotics and a regional block, the patient was escorted to the operative theatre and placed in a supine position.  General anesthesia was administered.  A surgical "time-out" was performed during which the planned procedure, proposed operative site, and the correct patient identity were compared to the operative consent and agreement confirmed by the circulating nurse according to current facility policy. Following application of a tourniquet to the operative extremity, the exposed skin was pre-scrubbed with Hibiclens scrub brush and then was prepped with Chloraprep and draped in the usual sterile fashion. The limb was exsanguinated with an Esmarch bandage and the tourniquet inflated to approximately 116mmHg higher than systolic BP.   A sinusoidal-shaped incision was marked and made over the approach of Mallie Mussel, centered on the level of the fracture. The skin was incised sharply with scalpel, subcutaneous tissues with blunt and spreading dissection.  The brachioradialis and superficial radial nerve were reflected radially,  the radial artery ulnarly.  The radius was thus exposed, and subperiosteally dissected such that the FPL and pronator were reflected ulnarly, as was the pronator.  There was some butterfly comminution at the fracture site.  It was irrigated and cleansed.  It was provisionally reduced without the butterfly fragment in place and an 8 hole plate selected and contoured.  The plate was applied to the proximal fragment and ultimately to the distal fragment not tightened.  The comminuted fragments were then placed and held with clamps while decompressing action of the plate was engaged by tightening the distal screw.  This locked the comminuted segment and rather well.  Fluoroscopy revealed near-anatomic alignment with restoration of the radial shape.  The other screws were sequentially drilled and filled with accommodation locking and nonlocking screws.  Final 2 screws placed were nonlocking screws at the site of comminution, both of them angled to try to capture good bone.  One actually held a butterfly fragment in place.  Final images were obtained and the wound was copiously irrigated.  The DRUJ was found to be stable to translational stress both in the pronated and supinated positions.  Decision was made to proceed with treatment of the DRUJ component of the injury in a closed fashion.  Tourniquet was released, some additional hemostasis obtained and then the wound was closed with 2-0 Vicryl deep dermal buried sutures and staples in the skin.  A short arm splint dressing was applied and she was awakened and taken to the recovery room in stable condition, breathing spontaneously  DISPOSITION: The patient will return to the floor today with typical post-op instructions.  She may be discharged at any point, returning to my office in 10-15 days for reevaluation with new  x-rays of the affected wrist out of the splint (3 view) and then placed into a longer version of the removable wrist splint.

## 2016-10-07 NOTE — Progress Notes (Signed)
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 (ROCEPHIN)  IV  1 g Intravenous QHS  . enoxaparin (LOVENOX) injection  40 mg Subcutaneous Q24H  . [MAR Hold] famotidine  40 mg Oral Daily  . [MAR Hold] iron polysaccharides  150 mg Oral Daily  . [MAR Hold] levothyroxine  75 mcg Oral QAC breakfast  . [MAR Hold] Milnacipran  50 mg Oral BID  . [MAR Hold] naloxegol oxalate  12.5 mg Oral Daily  . [MAR Hold] nicotine  21 mg Transdermal Daily  . [MAR Hold] polyethylene glycol  17 g Oral Daily  . [MAR Hold] silver sulfADIAZINE   Topical Daily  . [MAR Hold] simvastatin  20 mg Oral Daily  . [MAR Hold] sodium chloride flush  3 mL Intravenous Q12H   Continuous Infusions: . sodium chloride 75 mL/hr at 10/06/16 0436     LOS: 2 days    Time spent: >30 MINS    [facility]  [facility] Pager [phone]. If 7PM-7AM, please contact night-coverage at [email or web address], password [facility] 10/07/2016, 9:31 AM  LOS: 2 days
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
## 2016-10-07 NOTE — Transfer of Care (Signed)
Immediate Anesthesia Transfer of Care Note  Patient: Samantha Fletcher  Procedure(s) Performed: Procedure(s) with comments: OPEN TREATMENT OF GALEAZZI'S FRACTURE OF RIGHT RADIUS (Right) - GENERAL ANESTHESIA WITH PRE-OP BLOCK  Patient Location: PACU  Anesthesia Type:General and Regional  Level of Consciousness: awake, alert  and oriented  Airway & Oxygen Therapy: Patient Spontanous Breathing and Patient connected to nasal cannula oxygen  Post-op Assessment: Report given to RN and Post -op Vital signs reviewed and stable  Post vital signs: Reviewed and stable  Last Vitals:  Vitals:   10/06/16 2220 10/07/16 0526  BP: (!) 122/54 (!) 161/56  Pulse: 70 79  Resp: 17 17  Temp: (!) 38.1 C 37.3 C    Last Pain:  Vitals:   10/07/16 0531  TempSrc:   PainSc: 10-Worst pain ever      Patients Stated Pain Goal: 3 ([id number])  Complications: No apparent anesthesia complications

## 2016-10-07 NOTE — Progress Notes (Signed)
Report called to OR  

## 2016-10-07 NOTE — Anesthesia Procedure Notes (Signed)
Procedure Name: LMA Insertion Date/Time: 10/07/2016 7:38 AM Performed by: Clearnce Sorrel Pre-anesthesia Checklist: Patient identified, Emergency Drugs available, Suction available, Patient being monitored and Timeout performed Patient Re-evaluated:Patient Re-evaluated prior to inductionOxygen Delivery Method: Circle system utilized Preoxygenation: Pre-oxygenation with 100% oxygen Intubation Type: IV induction LMA: LMA inserted LMA Size: 4.0 Number of attempts: 1 Placement Confirmation: positive ETCO2 and breath sounds checked- equal and bilateral Tube secured with: Tape Dental Injury: Teeth and Oropharynx as per pre-operative assessment

## 2016-10-07 NOTE — Anesthesia Procedure Notes (Signed)
Anesthesia Regional Block:  Supraclavicular block  Pre-Anesthetic Checklist: ,, timeout performed, Correct Patient, Correct Site, Correct Laterality, Correct Procedure,, site marked, risks and benefits discussed, Surgical consent,  Pre-op evaluation,  At surgeon's request and post-op pain management  Laterality: Right  Prep: chloraprep       Needles:  Injection technique: Single-shot  Needle Type: Echogenic Stimulator Needle     Needle Length: 5cm 5 cm Needle Gauge: 22 and 22 G    Additional Needles:  Procedures: ultrasound guided (picture in chart) and nerve stimulator Supraclavicular block  Nerve Stimulator or Paresthesia:  Response: bicep contraction, 0.48 mA,   Additional Responses:   Narrative:  Start time: 10/07/2016 7:02 AM End time: 10/07/2016 7:12 AM Injection made incrementally with aspirations every 5 mL.  Performed by: Personally   Additional Notes: Functioning IV was confirmed and monitors applied.  A 2mm 22ga echogenic arrow stimulator was used. Sterile prep and drape,hand hygiene and sterile gloves were used.Ultrasound guidance: relevent anatomy identified, needle position confirmed, local anesthetic spread visualized around nerve(s)., vascular puncture avoided.  Image printed for medical record.  Negative aspiration and negative test dose prior to incremental administration of local anesthetic. The patient tolerated the procedure well.

## 2016-10-07 NOTE — Progress Notes (Signed)
Pt. Spiked fever 101.1 dr. Ree Kida will continue to monitor
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
## 2016-10-07 NOTE — Progress Notes (Signed)
Dentures returned to patient by sister

## 2016-10-07 NOTE — Anesthesia Postprocedure Evaluation (Signed)
Anesthesia Post Note  Patient: Samantha Fletcher  Procedure(s) Performed: Procedure(s) (LRB): OPEN TREATMENT OF GALEAZZI'S FRACTURE OF RIGHT RADIUS (Right)  Patient location during evaluation: PACU Anesthesia Type: General Level of consciousness: sedated Pain management: pain level controlled Vital Signs Assessment: post-procedure vital signs reviewed and stable Respiratory status: spontaneous breathing and respiratory function stable Cardiovascular status: stable Anesthetic complications: no    Last Vitals:  Vitals:   10/07/16 0915 10/07/16 0930  BP: 139/63 128/60  Pulse: 73 73  Resp: 20 19  Temp:      Last Pain:  Vitals:   10/07/16 0531  TempSrc:   PainSc: 10-Worst pain ever                 [name] DANIEL

## 2016-10-07 NOTE — Progress Notes (Signed)
10-07-16 ORIF R radius fx  Keep splint/dressing on, and clean and dry May be WBAT on R UE with a platform device only to assist in mobilization OT for digital and elbow ROM ex RTC approx 2 weeks.  Micheline Rough, MD Hand Surgery Mobile [phone]

## 2016-10-08 ENCOUNTER — Encounter (HOSPITAL_COMMUNITY): Payer: Self-pay | Admitting: Orthopedic Surgery

## 2016-10-08 LAB — CBC
HEMATOCRIT: 22.4 % — AB (ref 36.0–46.0)
HEMOGLOBIN: 7.3 g/dL — AB (ref 12.0–15.0)
MCH: 27.7 pg (ref 26.0–34.0)
MCHC: 32.6 g/dL (ref 30.0–36.0)
MCV: 84.8 fL (ref 78.0–100.0)
Platelets: 297 10*3/uL (ref 150–400)
RBC: 2.64 MIL/uL — AB (ref 3.87–5.11)
RDW: 15 % (ref 11.5–15.5)
WBC: 7.5 10*3/uL (ref 4.0–10.5)

## 2016-10-08 LAB — GLUCOSE, CAPILLARY: GLUCOSE-CAPILLARY: 92 mg/dL (ref 65–99)

## 2016-10-08 LAB — PREPARE RBC (CROSSMATCH)

## 2016-10-08 LAB — TSH: TSH: 1.262 u[IU]/mL (ref 0.350–4.500)

## 2016-10-08 MED ORDER — MORPHINE SULFATE (PF) 2 MG/ML IV SOLN
2.0000 mg | INTRAVENOUS | Status: DC | PRN
  Administered 2016-10-08 – 2016-10-10 (×7): 2 mg via INTRAVENOUS
  Filled 2016-10-08 (×7): qty 1

## 2016-10-08 MED ORDER — SODIUM CHLORIDE 0.9 % IV SOLN: Freq: Once | INTRAVENOUS | Status: DC

## 2016-10-08 MED ORDER — MORPHINE SULFATE (PF) 2 MG/ML IV SOLN
2.0000 mg | Freq: Once | INTRAVENOUS | Status: AC
  Administered 2016-10-08: 2 mg via INTRAVENOUS
  Filled 2016-10-08: qty 1

## 2016-10-08 MED ORDER — SODIUM CHLORIDE 0.9 % IV BOLUS (SEPSIS)
250.0000 mL | Freq: Once | INTRAVENOUS | Status: AC
  Administered 2016-10-08: 250 mL via INTRAVENOUS

## 2016-10-08 MED ORDER — NALOXEGOL OXALATE 12.5 MG PO TABS
12.5000 mg | ORAL_TABLET | Freq: Once | ORAL | Status: AC
  Administered 2016-10-08: 12.5 mg via ORAL
  Filled 2016-10-08: qty 1

## 2016-10-08 MED ORDER — NALOXEGOL OXALATE 25 MG PO TABS
25.0000 mg | ORAL_TABLET | Freq: Every day | ORAL | Status: DC
  Administered 2016-10-09 – 2016-10-10 (×2): 25 mg via ORAL
  Filled 2016-10-08 (×2): qty 1

## 2016-10-08 NOTE — Progress Notes (Signed)
Pt continuously complaining of severe surgical pain even after zanaflex and xanax were given. Paged Kirby,NP at 2230 and Baltazar Najjar placed order for 2mg  of Morphine. RN gave morphine at 2244. Patient went to sleep and rested after med was given. At 0330, patient began complaining of severe surgical pain again. Not time for PRN oxy IR to be given. Paged Kirby,NP about patient's pain. Kirby,NP placed second order for 2mg  of morphine. RN administered morphine at 0343. Patient now resting comfortably.
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
## 2016-10-08 NOTE — Progress Notes (Signed)
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 10/03/16 1656 10/05/16 0451 10/06/16 0526 10/07/16 0918  NA 133* 135 133* 134*  K 2.8* 3.4* 3.9 3.3*  CL 95* 100* 102 103  CO2 24 23 22 22   GLUCOSE 111* 103* 90 112*  BUN 23* 19 14 10   CREATININE 1.46* 1.04* 0.98 0.80  CALCIUM 9.1 8.2* 8.2* 8.0*  MG  --  2.0  --   --   AST 50*  --  51* 44*  ALT 27  --  29 31  ALKPHOS 70  --  62 64  BILITOT 0.5  --  0.5 0.3   ------------------------------------------------------------------------------------------------------------------ estimated creatinine clearance is 50.8 mL/min (by C-G formula based on SCr of 0.8 mg/dL). ------------------------------------------------------------------------------------------------------------------ No results for input(s): HGBA1C in the last 72 hours. ------------------------------------------------------------------------------------------------------------------ No results for input(s): CHOL, HDL, LDLCALC, TRIG, CHOLHDL, LDLDIRECT in the last 72  hours. ------------------------------------------------------------------------------------------------------------------ No results for input(s): TSH, T4TOTAL, T3FREE, THYROIDAB in the last 72 hours.  Invalid input(s): FREET3 ------------------------------------------------------------------------------------------------------------------ No results for input(s): VITAMINB12, FOLATE, FERRITIN, TIBC, IRON, RETICCTPCT in the last 72 hours.  Coagulation profile No results for input(s): INR, PROTIME in the last 168 hours.  No results for input(s): DDIMER in the last 72 hours.  Cardiac Enzymes No results for input(s): CKMB, TROPONINI, MYOGLOBIN in the last 168 hours.  Invalid input(s): CK ------------------------------------------------------------------------------------------------------------------ Invalid input(s): POCBNP   CBG:  Recent Labs Lab 10/05/16 0749 10/06/16 0747 10/08/16 0814  GLUCAP 70 94 92       Studies: Dg Forearm Right  Result Date: 10/07/2016 CLINICAL DATA:  Open reduction internal fixation EXAM: DG C-ARM 61-120 MIN; RIGHT FOREARM - 2 VIEW COMPARISON:  October 03, 2016 FLUOROSCOPY TIME:  0 minutes 12 seconds; 3 submitted images FINDINGS: Frontal and lateral views show screw and plate fixation through a comminuted fracture at the junction mid and distal thirds of the right radius with fracture fragments in near anatomic alignment. No other fracture. No dislocation evident. IMPRESSION: Alignment near anatomic at the comminuted fracture at the junction of mid and distal thirds of the radius with screw and plate fixation. No new fracture. No dislocation. Electronically Signed   By: Lowella Grip III M.D.   On: 10/07/2016 12:57   Dg Chest Port 1 View  Result Date: 10/08/2016 CLINICAL DATA:  Acute onset of fever.  Initial encounter. EXAM: PORTABLE CHEST 1 VIEW COMPARISON:  Chest radiograph performed 10/04/2016 FINDINGS: The lungs are well-aerated. Minimal left  basilar opacity may reflect atelectasis or possibly mild infection, depending on the patient's symptoms. There is no evidence of pleural effusion or pneumothorax. The cardiomediastinal silhouette  is within normal limits. No acute osseous abnormalities are seen. IMPRESSION: Minimal left basilar opacity may reflect atelectasis or possibly mild infection, depending on the patient's symptoms. Electronically Signed   By: Garald Balding M.D.   On: 10/08/2016 00:20   Dg C-arm 1-60 Min  Result Date: 10/07/2016 CLINICAL DATA:  Open reduction internal fixation EXAM: DG C-ARM 61-120 MIN; RIGHT FOREARM - 2 VIEW COMPARISON:  October 03, 2016 FLUOROSCOPY TIME:  0 minutes 12 seconds; 3 submitted images FINDINGS: Frontal and lateral views show screw and plate fixation through a comminuted fracture at the junction mid and distal thirds of the right radius with fracture fragments in near anatomic alignment. No other fracture. No dislocation evident. IMPRESSION: Alignment near anatomic at the comminuted fracture at the junction of mid and distal thirds of the radius with screw and plate fixation. No new fracture. No dislocation. Electronically Signed   By: Lowella Grip III M.D.   On: 10/07/2016 12:57      No results found for: HGBA1C Lab Results  Component Value Date   CREATININE 0.80 10/07/2016       Scheduled Meds: . acidophilus  1 capsule Oral Daily  . allopurinol  100 mg Oral Daily  . calcium-vitamin D  1 tablet Oral Q breakfast  . cefTRIAXone (ROCEPHIN)  IV  1 g Intravenous QHS  . enoxaparin (LOVENOX) injection  40 mg Subcutaneous Q24H  . famotidine  40 mg Oral Daily  . iron polysaccharides  150 mg Oral Daily  . levothyroxine  75 mcg Oral QAC breakfast  . Milnacipran  50 mg Oral BID  . naloxegol oxalate  12.5 mg Oral Daily  . nicotine  21 mg Transdermal Daily  . polyethylene glycol  17 g Oral Daily  . silver sulfADIAZINE   Topical Daily  . simvastatin  20 mg Oral Daily  . sodium chloride  250  mL Intravenous Once  . sodium chloride flush  3 mL Intravenous Q12H   Continuous Infusions:    LOS: 3 days    Time spent: >30 MINS    [facility]  [facility] Pager [phone]. If 7PM-7AM, please contact night-coverage at [email or web address], password [email or web address] 10/08/2016, 10:00 AM  LOS: 3 days
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
## 2016-10-09 DIAGNOSIS — G9340 Encephalopathy, unspecified: Secondary | ICD-10-CM

## 2016-10-09 DIAGNOSIS — F320 Major depressive disorder, single episode, mild: Secondary | ICD-10-CM

## 2016-10-09 DIAGNOSIS — F419 Anxiety disorder, unspecified: Secondary | ICD-10-CM

## 2016-10-09 DIAGNOSIS — S52101A Unspecified fracture of upper end of right radius, initial encounter for closed fracture: Secondary | ICD-10-CM

## 2016-10-09 DIAGNOSIS — N3000 Acute cystitis without hematuria: Secondary | ICD-10-CM

## 2016-10-09 DIAGNOSIS — N179 Acute kidney failure, unspecified: Secondary | ICD-10-CM

## 2016-10-09 DIAGNOSIS — E785 Hyperlipidemia, unspecified: Secondary | ICD-10-CM

## 2016-10-09 DIAGNOSIS — F17200 Nicotine dependence, unspecified, uncomplicated: Secondary | ICD-10-CM

## 2016-10-09 DIAGNOSIS — E876 Hypokalemia: Secondary | ICD-10-CM

## 2016-10-09 LAB — TYPE AND SCREEN
ABO/RH(D): O NEG
ANTIBODY SCREEN: NEGATIVE
UNIT DIVISION: 0

## 2016-10-09 LAB — CBC
HCT: 29.3 % — ABNORMAL LOW (ref 36.0–46.0)
HEMOGLOBIN: 9.7 g/dL — AB (ref 12.0–15.0)
MCH: 28 pg (ref 26.0–34.0)
MCHC: 33.1 g/dL (ref 30.0–36.0)
MCV: 84.4 fL (ref 78.0–100.0)
PLATELETS: 342 10*3/uL (ref 150–400)
RBC: 3.47 MIL/uL — AB (ref 3.87–5.11)
RDW: 14.7 % (ref 11.5–15.5)
WBC: 7.2 10*3/uL (ref 4.0–10.5)

## 2016-10-09 LAB — COMPREHENSIVE METABOLIC PANEL
ALBUMIN: 2.5 g/dL — AB (ref 3.5–5.0)
ALK PHOS: 115 U/L (ref 38–126)
ALT: 74 U/L — AB (ref 14–54)
ANION GAP: 12 (ref 5–15)
AST: 90 U/L — ABNORMAL HIGH (ref 15–41)
BUN: 10 mg/dL (ref 6–20)
CHLORIDE: 98 mmol/L — AB (ref 101–111)
CO2: 22 mmol/L (ref 22–32)
Calcium: 8.7 mg/dL — ABNORMAL LOW (ref 8.9–10.3)
Creatinine, Ser: 0.76 mg/dL (ref 0.44–1.00)
GFR calc non Af Amer: >60 mL/min (ref >60)
GLUCOSE: 88 mg/dL (ref 65–99)
Potassium: 3.9 mmol/L (ref 3.5–5.1)
SODIUM: 132 mmol/L — AB (ref 135–145)
Total Bilirubin: 0.7 mg/dL (ref 0.3–1.2)
Total Protein: 5.6 g/dL — ABNORMAL LOW (ref 6.5–8.1)

## 2016-10-09 LAB — GLUCOSE, CAPILLARY: Glucose-Capillary: 82 mg/dL (ref 65–99)

## 2016-10-09 NOTE — Care Management Important Message (Signed)
Important Message  Patient Details  Name: Samantha Fletcher MRN: [id number] Date of Birth: [birth year]   Medicare Important Message Given:  Yes    Nathen May 10/09/2016, 10:23 AM
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
## 2016-10-10 DIAGNOSIS — E039 Hypothyroidism, unspecified: Secondary | ICD-10-CM

## 2016-10-10 DIAGNOSIS — M25562 Pain in left knee: Secondary | ICD-10-CM

## 2016-10-10 LAB — CBC
HEMATOCRIT: 31.3 % — AB (ref 36.0–46.0)
HEMOGLOBIN: 10.3 g/dL — AB (ref 12.0–15.0)
MCH: 27.8 pg (ref 26.0–34.0)
MCHC: 32.9 g/dL (ref 30.0–36.0)
MCV: 84.4 fL (ref 78.0–100.0)
Platelets: 447 10*3/uL — ABNORMAL HIGH (ref 150–400)
RBC: 3.71 MIL/uL — AB (ref 3.87–5.11)
RDW: 14.4 % (ref 11.5–15.5)
WBC: 8.5 10*3/uL (ref 4.0–10.5)

## 2016-10-10 LAB — BASIC METABOLIC PANEL
ANION GAP: 12 (ref 5–15)
BUN: 15 mg/dL (ref 6–20)
CHLORIDE: 99 mmol/L — AB (ref 101–111)
CO2: 24 mmol/L (ref 22–32)
Calcium: 9.4 mg/dL (ref 8.9–10.3)
Creatinine, Ser: 0.88 mg/dL (ref 0.44–1.00)
GFR calc non Af Amer: >60 mL/min (ref >60)
GLUCOSE: 99 mg/dL (ref 65–99)
POTASSIUM: 4.1 mmol/L (ref 3.5–5.1)
Sodium: 135 mmol/L (ref 135–145)

## 2016-10-10 LAB — GLUCOSE, CAPILLARY: GLUCOSE-CAPILLARY: 94 mg/dL (ref 65–99)

## 2016-10-10 MED ORDER — NICOTINE 21 MG/24HR TD PT24: 21.0000 mg | MEDICATED_PATCH | Freq: Every day | TRANSDERMAL | 0 refills | Status: DC

## 2016-10-10 MED ORDER — ZOLPIDEM TARTRATE 5 MG PO TABS: 5.0000 mg | ORAL_TABLET | Freq: Every day | ORAL | 0 refills | Status: DC

## 2016-10-10 MED ORDER — ALPRAZOLAM 1 MG PO TABS: 0.5000 mg | ORAL_TABLET | Freq: Two times a day (BID) | ORAL | 0 refills | Status: DC | PRN

## 2016-10-10 MED ORDER — DICLOFENAC SODIUM 1 % TD GEL: 2.0000 g | Freq: Every day | TRANSDERMAL | 0 refills | Status: DC | PRN

## 2016-10-10 NOTE — Progress Notes (Signed)
Patient will DC to: [place] date: 10/10/16 Family notified: Sister Transport by: Corey Harold   Per MD patient ready for DC to [facility] and [place], patient, patient's family, and facility notified of DC. Discharge Summary sent to facility. RN given number for report. DC packet on chart. Ambulance transport requested for patient.   CSW signing off.  Cedric Fishman, [place] Social Worker [phone]
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
## 2016-10-10 NOTE — Clinical Social Work Placement (Signed)
   CLINICAL SOCIAL WORK PLACEMENT  NOTE  Date:  10/10/2016  Patient Details  Name: Samantha Fletcher MRN: [id number] Date of Birth: 02-Oct-1946  Clinical Social Work is seeking post-discharge placement for this patient at the [place] level of care (*CSW will initial, date and re-position this form in  chart as items are completed):  Yes   Patient/family provided with [place] Work Department's list of facilities offering this level of care within the geographic area requested by the patient (or if unable, by the patient's family).  Yes   Patient/family informed of their freedom to choose among providers that offer the needed level of care, that participate in Medicare, Medicaid or managed care program needed by the patient, have an available bed and are willing to accept the patient.  Yes   Patient/family informed of [facility]'s ownership interest in [facility] and [facility], as well as of the fact that they are under no obligation to receive care at these facilities.  PASRR submitted to EDS on 10/06/16     PASRR number received on       Existing PASRR number confirmed on 10/06/16     FL2 transmitted to all facilities in geographic area requested by pt/family on 10/06/16     FL2 transmitted to all facilities within larger geographic area on       Patient informed that his/her managed care company has contracts with or will negotiate with certain facilities, including the following:        Yes   Patient/family informed of bed offers received.  Patient chooses bed at [facility] and [facility] recommends and patient chooses bed at      Patient to be transferred to [facility] and [facility] on 10/10/16.  Patient to be transferred to facility by [facility]     Patient family notified on 10/10/16 of transfer.  Name of family member notified:  Sister     PHYSICIAN Please sign FL2     Additional Comment:     _______________________________________________ Benard Halsted, [place] 10/10/2016, 10:40 AM
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
## 2016-10-12 LAB — CULTURE, BLOOD (ROUTINE X 2)
Culture: NO GROWTH
Culture: NO GROWTH

## 2017-05-02 ENCOUNTER — Ambulatory Visit (INDEPENDENT_AMBULATORY_CARE_PROVIDER_SITE_OTHER): Payer: Medicare Other | Admitting: Sports Medicine

## 2017-05-02 DIAGNOSIS — M2040 Other hammer toe(s) (acquired), unspecified foot: Secondary | ICD-10-CM

## 2017-05-02 DIAGNOSIS — M722 Plantar fascial fibromatosis: Secondary | ICD-10-CM | POA: Diagnosis not present

## 2017-05-02 DIAGNOSIS — M216X1 Other acquired deformities of right foot: Secondary | ICD-10-CM

## 2017-05-02 DIAGNOSIS — M79673 Pain in unspecified foot: Secondary | ICD-10-CM

## 2017-05-02 DIAGNOSIS — R29898 Other symptoms and signs involving the musculoskeletal system: Secondary | ICD-10-CM

## 2017-05-02 DIAGNOSIS — M5416 Radiculopathy, lumbar region: Secondary | ICD-10-CM

## 2017-05-02 DIAGNOSIS — L909 Atrophic disorder of skin, unspecified: Secondary | ICD-10-CM

## 2017-05-02 DIAGNOSIS — G8929 Other chronic pain: Secondary | ICD-10-CM

## 2017-05-02 MED ORDER — MELOXICAM 15 MG PO TABS: 15.0000 mg | ORAL_TABLET | Freq: Every day | ORAL | 0 refills | Status: DC

## 2017-05-02 MED ORDER — METHYLPREDNISOLONE 4 MG PO TBPK: ORAL_TABLET | ORAL | 0 refills | Status: DC
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
## 2017-05-30 ENCOUNTER — Ambulatory Visit (INDEPENDENT_AMBULATORY_CARE_PROVIDER_SITE_OTHER): Payer: Medicare Other | Admitting: Sports Medicine

## 2017-05-30 DIAGNOSIS — R29898 Other symptoms and signs involving the musculoskeletal system: Secondary | ICD-10-CM

## 2017-05-30 DIAGNOSIS — M2040 Other hammer toe(s) (acquired), unspecified foot: Secondary | ICD-10-CM

## 2017-05-30 DIAGNOSIS — M216X1 Other acquired deformities of right foot: Secondary | ICD-10-CM

## 2017-05-30 DIAGNOSIS — L909 Atrophic disorder of skin, unspecified: Secondary | ICD-10-CM

## 2017-05-30 DIAGNOSIS — M722 Plantar fascial fibromatosis: Secondary | ICD-10-CM

## 2017-05-30 NOTE — Progress Notes (Signed)
Patient discussed with medical assistant. One pair of Custom Functional orthotics were fitted and dispensed to patient with wear instructions and break in period explained. Patient to follow up as scheduled for continued care or sooner if problems or issues arise. -Dr. Eria Lozoya  

## 2017-05-30 NOTE — Patient Instructions (Signed)

## 2017-07-04 ENCOUNTER — Ambulatory Visit (INDEPENDENT_AMBULATORY_CARE_PROVIDER_SITE_OTHER): Payer: Medicare Other | Admitting: Sports Medicine

## 2017-07-04 DIAGNOSIS — M722 Plantar fascial fibromatosis: Secondary | ICD-10-CM

## 2017-07-04 DIAGNOSIS — M779 Enthesopathy, unspecified: Secondary | ICD-10-CM

## 2017-07-04 DIAGNOSIS — M79671 Pain in right foot: Secondary | ICD-10-CM

## 2017-07-04 DIAGNOSIS — L909 Atrophic disorder of skin, unspecified: Secondary | ICD-10-CM

## 2017-07-04 DIAGNOSIS — M216X1 Other acquired deformities of right foot: Secondary | ICD-10-CM

## 2017-07-04 DIAGNOSIS — M2040 Other hammer toe(s) (acquired), unspecified foot: Secondary | ICD-10-CM

## 2017-07-04 DIAGNOSIS — R29898 Other symptoms and signs involving the musculoskeletal system: Secondary | ICD-10-CM

## 2017-07-04 MED ORDER — METHYLPREDNISOLONE 4 MG PO TBPK: ORAL_TABLET | ORAL | 0 refills | Status: DC

## 2017-07-04 MED ORDER — TRIAMCINOLONE ACETONIDE 10 MG/ML IJ SUSP: 10.0000 mg | Freq: Once | INTRAMUSCULAR | Status: DC

## 2017-07-04 MED ORDER — MELOXICAM 15 MG PO TABS: 15.0000 mg | ORAL_TABLET | Freq: Every day | ORAL | 0 refills | Status: DC
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
## 2017-08-01 ENCOUNTER — Other Ambulatory Visit: Payer: Self-pay | Admitting: Pharmacist

## 2017-08-01 NOTE — Patient Outreach (Signed)
Outreach call to Samantha Fletcher regarding her request for follow up from the [facility] Medication Adherence Campaign. Patient does not answer and no voicemail is available.  Harlow Asa, PharmD, [facility] [phone]

## 2017-09-15 DIAGNOSIS — M25559 Pain in unspecified hip: Secondary | ICD-10-CM

## 2017-09-15 DIAGNOSIS — M961 Postlaminectomy syndrome, not elsewhere classified: Secondary | ICD-10-CM | POA: Insufficient documentation

## 2017-09-15 HISTORY — DX: Pain in unspecified hip: M25.559

## 2017-09-15 HISTORY — DX: Postlaminectomy syndrome, not elsewhere classified: M96.1

## 2017-11-12 IMAGING — CT CT KNEE*L* W/O CM
3 of 4 series · 16 of 33 positions shown, 18 images · non-contrast
Comparison: None.

CLINICAL DATA: Left knee pain.

EXAM:
CT OF THE LEFT KNEE WITHOUT CONTRAST
TECHNIQUE: Multidetector CT imaging of the LEFT knee was performed according to
the standard protocol. Multiplanar CT image reconstructions were
also generated.

[Series 7: lower ext cor st · coronal · 0.36mm/px · 3 of 109 slices shown]
[im 22/109  bone]
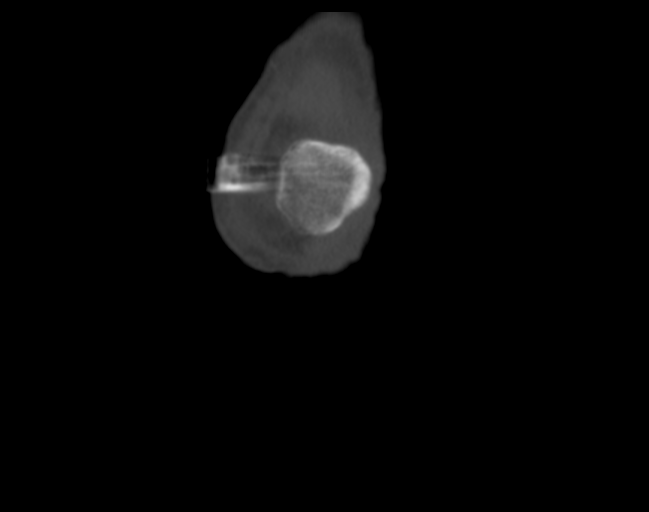
[im 44/109  bone]
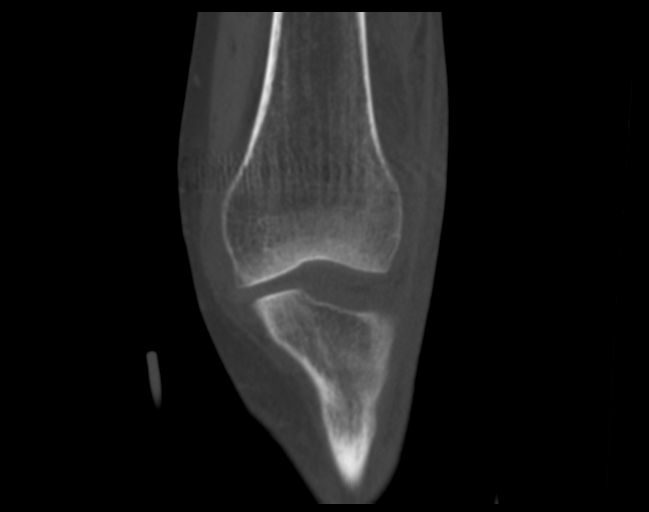
[im 65/109  bone]
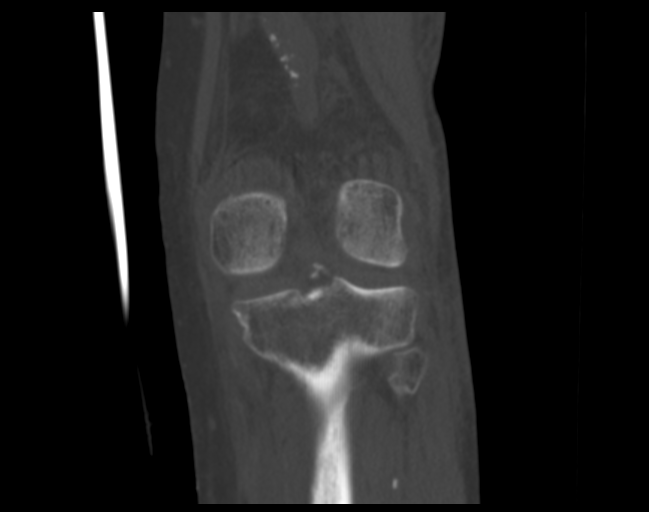

[Series 9: lower ext sag st · sagittal · 0.33mm/px · 5 of 150 slices shown]
[im 22/150  bone]
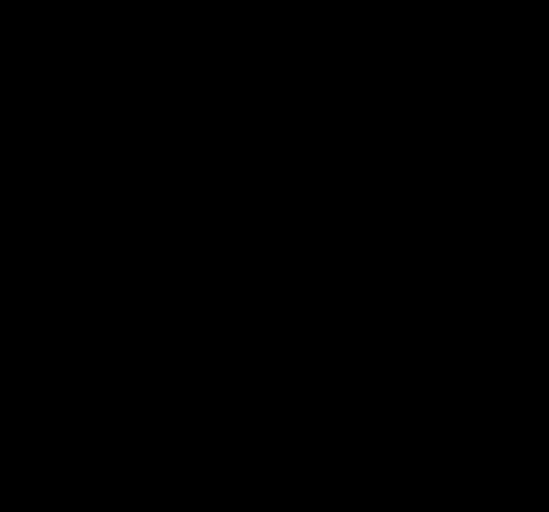
[im 43/150  bone]
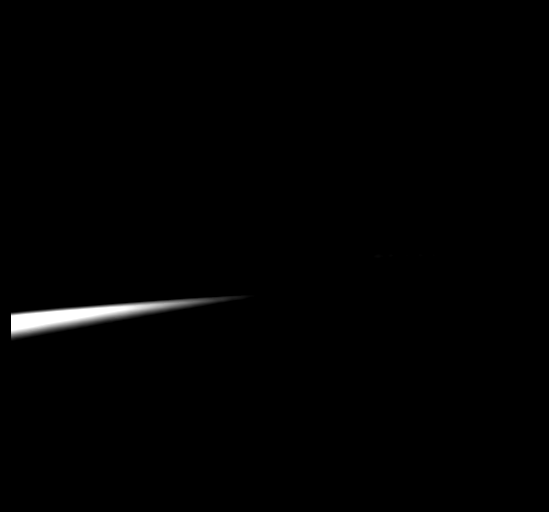
[im 64/150  bone]
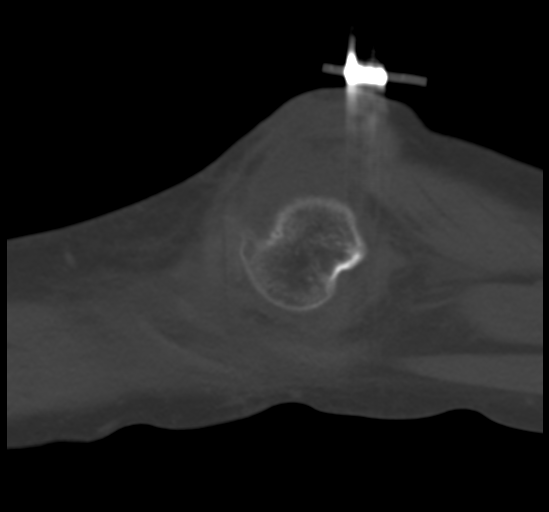
[im 86/150  bone]
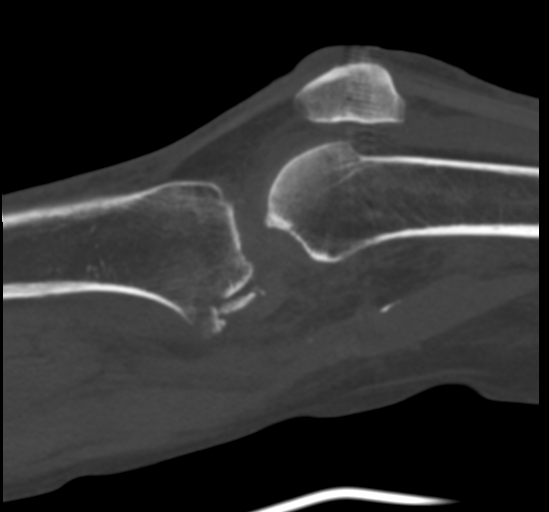
[im 107/150  bone]
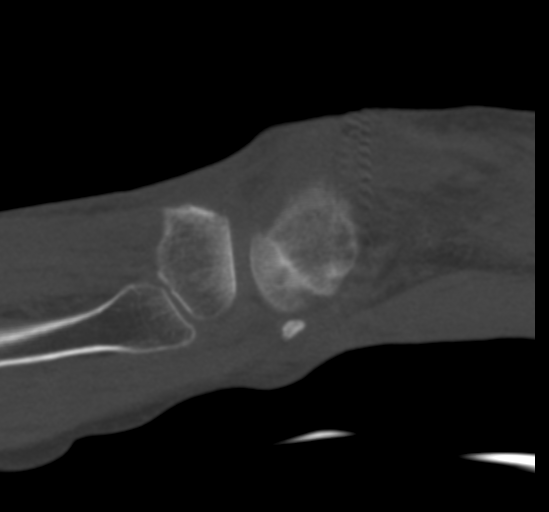

[Series 11: lower ext 1.5 st · axial · 0.37mm/px · z∈[-193,-44]mm · 8 of 117 slices shown, 10 images]
[im 9/117  soft-tissue]
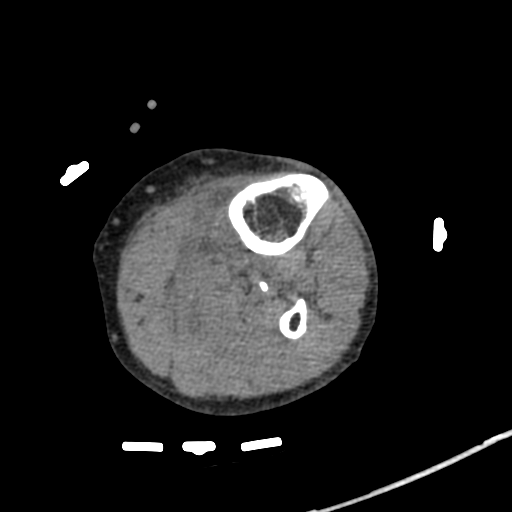
[im 9/117  bone]
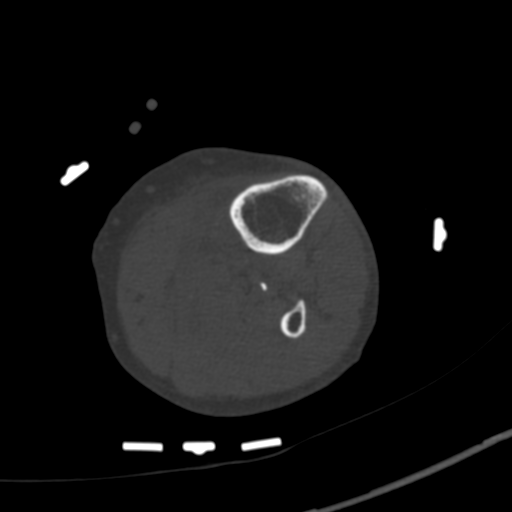
[im 27/117  bone]
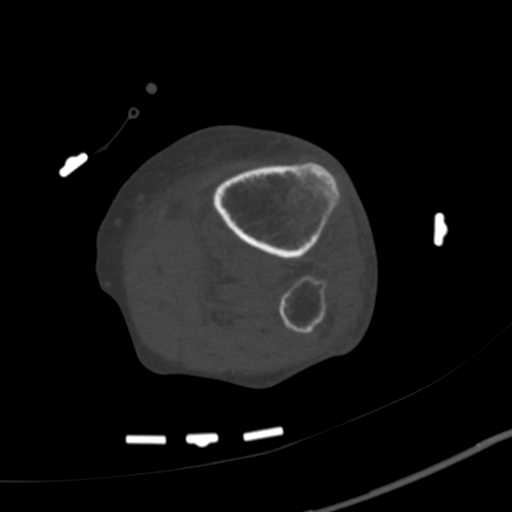
[im 36/117  bone]
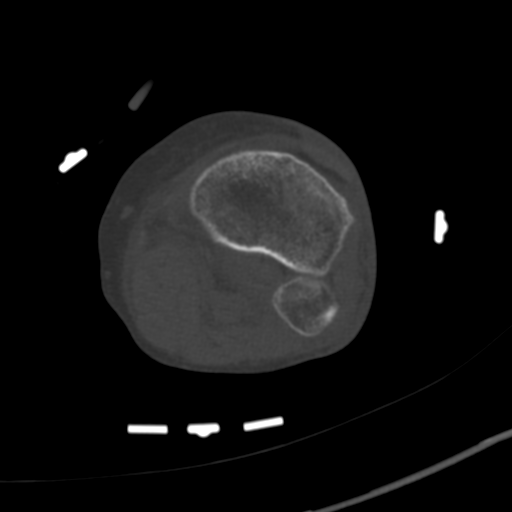
[im 54/117  bone]
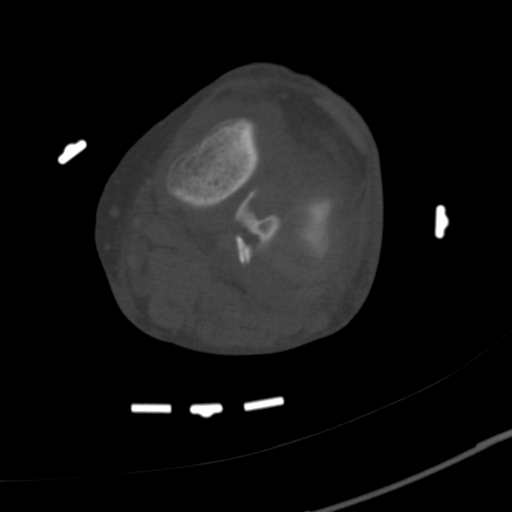
[im 63/117  soft-tissue]
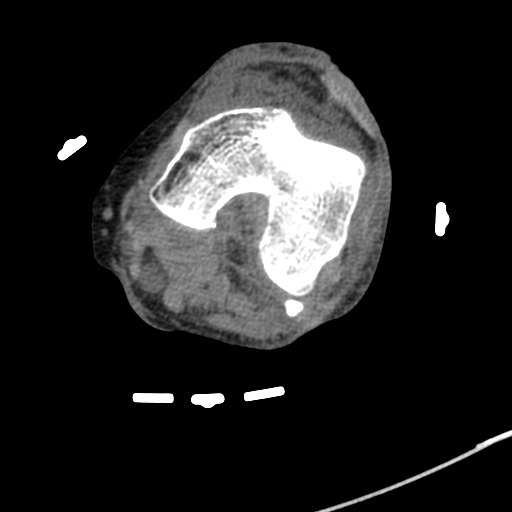
[im 63/117  bone]
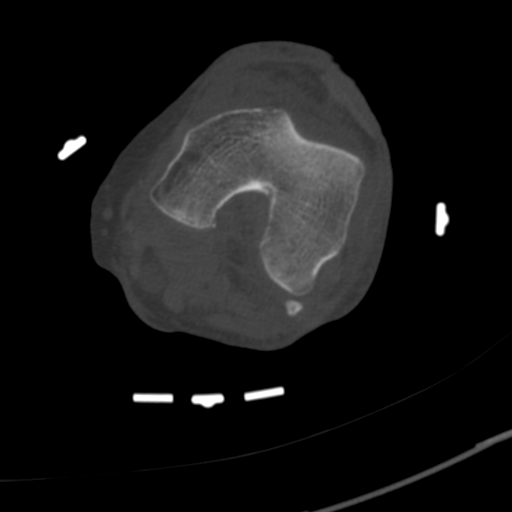
[im 81/117  bone]
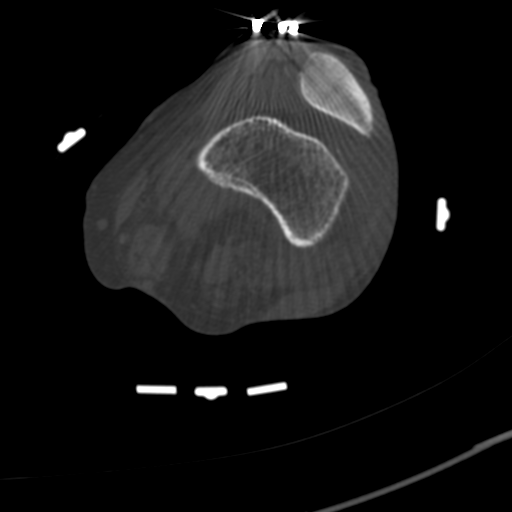
[im 90/117  bone]
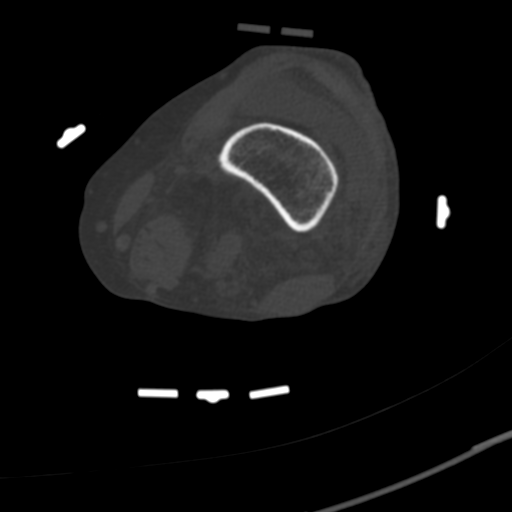
[im 108/117  bone]
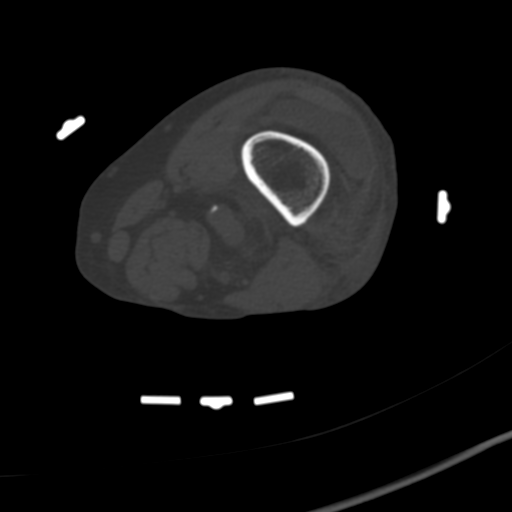

[16 of 33 positions shown; findings below may reference images not displayed]

FINDINGS: Bones/Joint/Cartilage

Comminuted fracture of the lateral aspect of the posterior medial
tibial plateau at the PCL insertion. No other acute fracture or
dislocation. Large joint effusion.

Mild medial femorotibial compartment joint space narrowing.

Ligaments

Suboptimally assessed by CT.

Muscles and Tendons

Muscles are normal. No intramuscular hematoma. No muscle atrophy.
Intact quadriceps and patellar tendon.

Soft tissues

No fluid collection or hematoma. Peripheral vascular atherosclerotic
disease.
IMPRESSION: 1. Comminuted fracture of the lateral aspect of the posterior medial
tibial plateau at the PCL insertion.

## 2017-11-14 IMAGING — RF DG C-ARM 61-120 MIN
1 series · 3 of 3 positions shown · non-contrast
Comparison: October 03, 2016

FLUOROSCOPY TIME:  0 minutes 12 seconds; 3 submitted images

CLINICAL DATA: Open reduction internal fixation

EXAM:
DG C-ARM 61-120 MIN; RIGHT FOREARM - 2 VIEW

[Series 1: run · 3 of 3 slices shown]
[im 1/3]
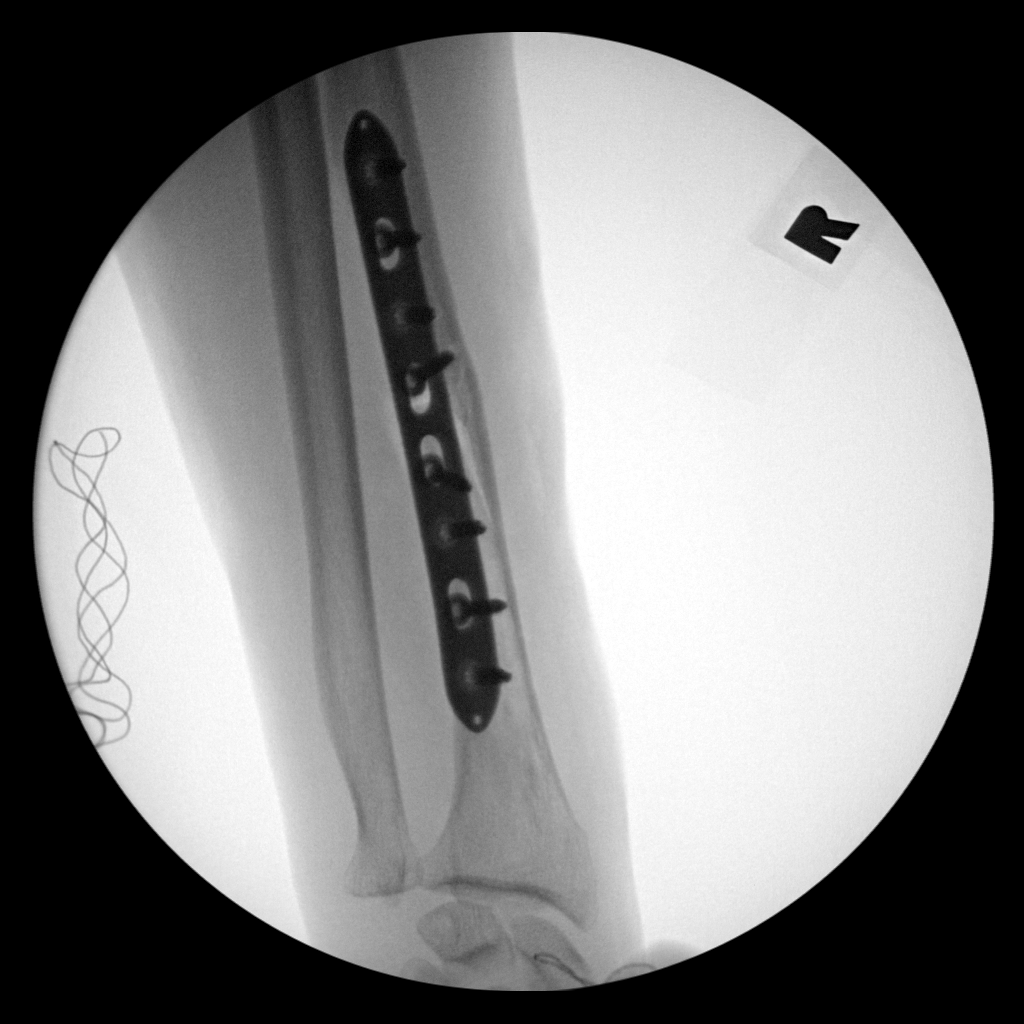
[im 2/3]
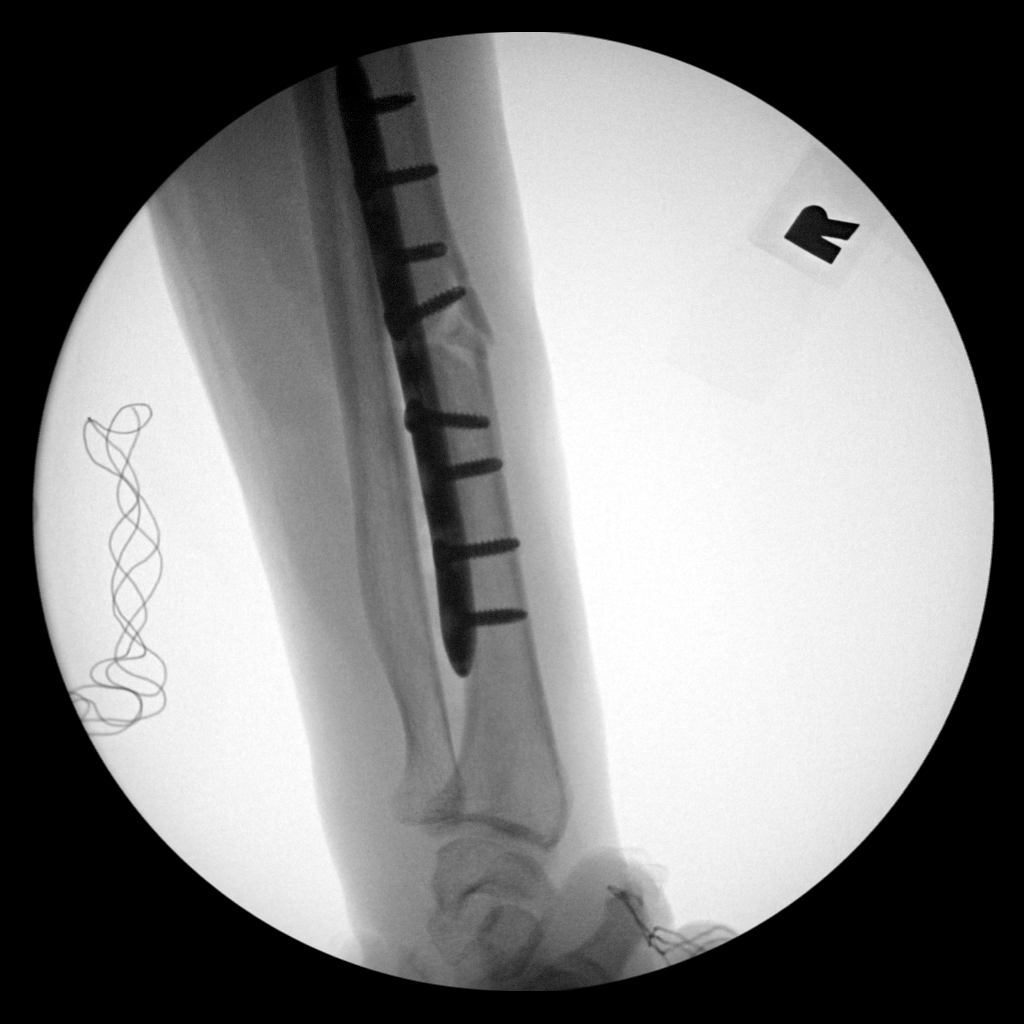
[im 3/3]
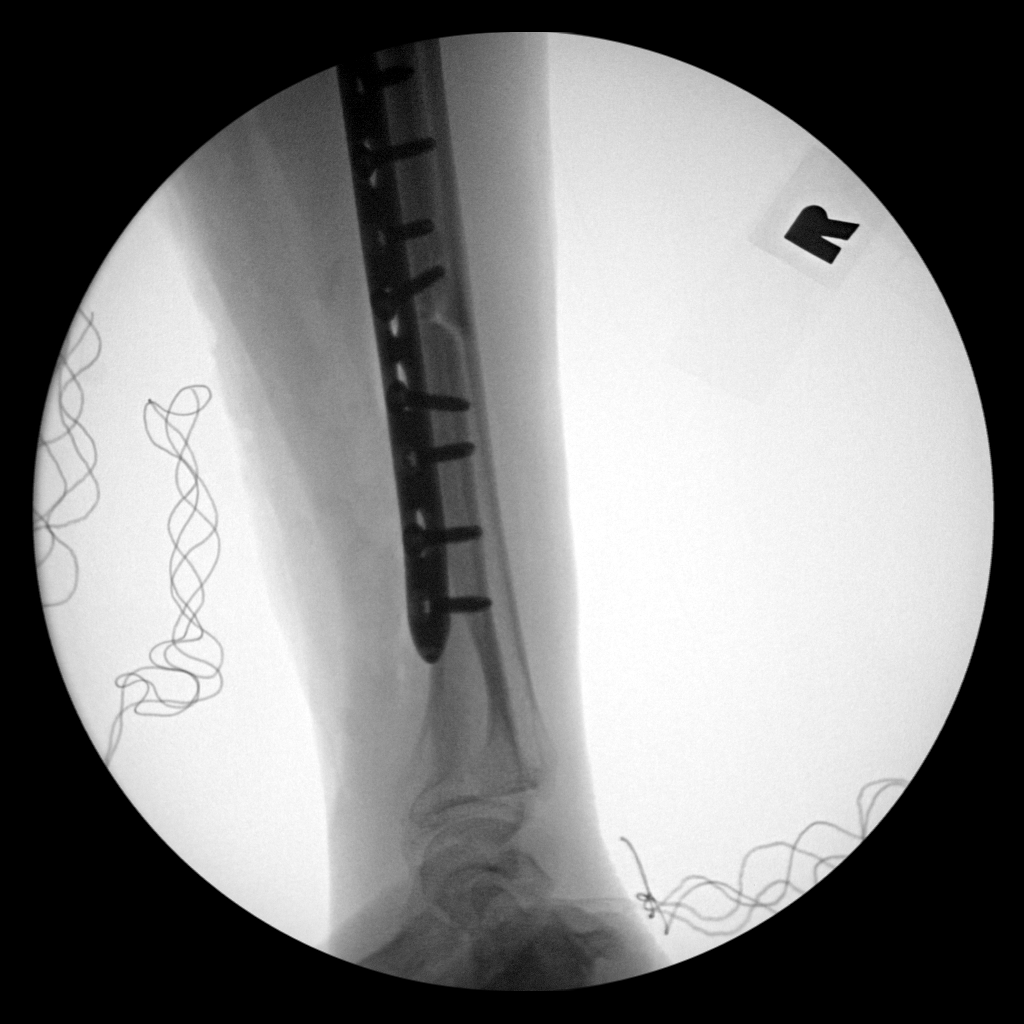

[3 of 3 positions shown; findings below may reference images not displayed]

FINDINGS: Frontal and lateral views show screw and plate fixation through a
comminuted fracture at the junction mid and distal thirds of the
right radius with fracture fragments in near anatomic alignment. No
other fracture. No dislocation evident.
IMPRESSION: Alignment near anatomic at the comminuted fracture at the junction
of mid and distal thirds of the radius with screw and plate
fixation. No new fracture. No dislocation.

## 2017-11-14 IMAGING — CR DG CHEST 1V PORT
1 series · 1 of 1 positions shown · non-contrast
Comparison: Chest radiograph performed 10/04/2016

CLINICAL DATA: Acute onset of fever.  Initial encounter.

EXAM:
PORTABLE CHEST 1 VIEW

[AP]
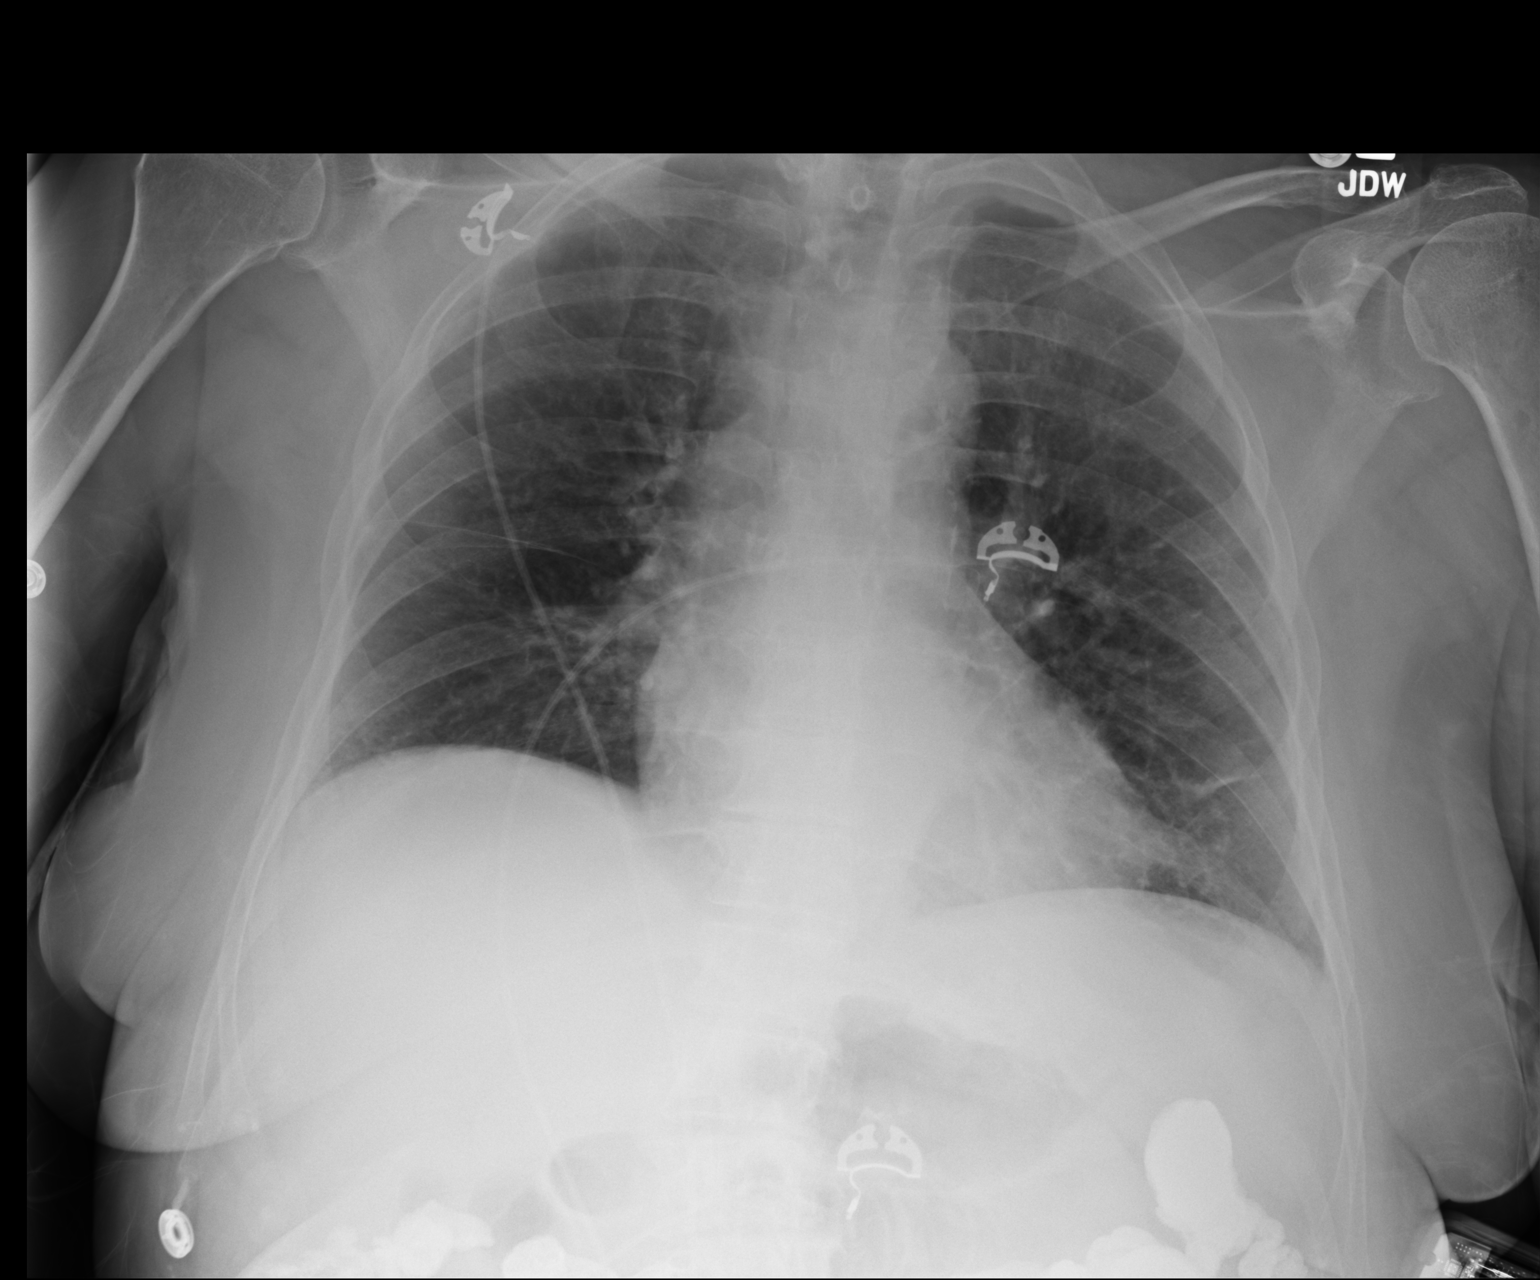

[1 of 1 positions shown; findings below may reference images not displayed]

FINDINGS: The lungs are well-aerated. Minimal left basilar opacity may reflect
atelectasis or possibly mild infection, depending on the patient's
symptoms. There is no evidence of pleural effusion or pneumothorax.

The cardiomediastinal silhouette is within normal limits. No acute
osseous abnormalities are seen.
IMPRESSION: Minimal left basilar opacity may reflect atelectasis or possibly
mild infection, depending on the patient's symptoms.

## 2018-02-23 DIAGNOSIS — M5416 Radiculopathy, lumbar region: Secondary | ICD-10-CM

## 2018-02-23 HISTORY — DX: Radiculopathy, lumbar region: M54.16

## 2018-03-25 DIAGNOSIS — M961 Postlaminectomy syndrome, not elsewhere classified: Secondary | ICD-10-CM | POA: Insufficient documentation

## 2018-03-25 HISTORY — DX: Postlaminectomy syndrome, not elsewhere classified: M96.1

## 2018-08-06 DIAGNOSIS — G8929 Other chronic pain: Secondary | ICD-10-CM | POA: Insufficient documentation

## 2018-08-06 HISTORY — DX: Other chronic pain: G89.29

## 2018-12-10 DIAGNOSIS — M25561 Pain in right knee: Secondary | ICD-10-CM | POA: Diagnosis not present

## 2018-12-10 DIAGNOSIS — M5416 Radiculopathy, lumbar region: Secondary | ICD-10-CM | POA: Diagnosis not present

## 2018-12-10 DIAGNOSIS — M961 Postlaminectomy syndrome, not elsewhere classified: Secondary | ICD-10-CM | POA: Diagnosis not present

## 2018-12-10 DIAGNOSIS — G8929 Other chronic pain: Secondary | ICD-10-CM | POA: Diagnosis not present

## 2019-01-12 DIAGNOSIS — M961 Postlaminectomy syndrome, not elsewhere classified: Secondary | ICD-10-CM | POA: Diagnosis not present

## 2019-01-12 DIAGNOSIS — M5416 Radiculopathy, lumbar region: Secondary | ICD-10-CM | POA: Diagnosis not present

## 2019-01-23 DIAGNOSIS — M109 Gout, unspecified: Secondary | ICD-10-CM | POA: Diagnosis not present

## 2019-01-23 DIAGNOSIS — E785 Hyperlipidemia, unspecified: Secondary | ICD-10-CM | POA: Diagnosis not present

## 2019-01-23 DIAGNOSIS — I10 Essential (primary) hypertension: Secondary | ICD-10-CM | POA: Diagnosis not present

## 2019-02-05 DIAGNOSIS — J40 Bronchitis, not specified as acute or chronic: Secondary | ICD-10-CM | POA: Diagnosis not present

## 2019-02-05 DIAGNOSIS — J329 Chronic sinusitis, unspecified: Secondary | ICD-10-CM | POA: Diagnosis not present

## 2019-02-05 DIAGNOSIS — Z6822 Body mass index (BMI) 22.0-22.9, adult: Secondary | ICD-10-CM | POA: Diagnosis not present

## 2019-02-18 DIAGNOSIS — M961 Postlaminectomy syndrome, not elsewhere classified: Secondary | ICD-10-CM | POA: Diagnosis not present

## 2019-02-18 DIAGNOSIS — M5416 Radiculopathy, lumbar region: Secondary | ICD-10-CM | POA: Diagnosis not present

## 2019-02-23 DIAGNOSIS — I10 Essential (primary) hypertension: Secondary | ICD-10-CM | POA: Diagnosis not present

## 2019-02-23 DIAGNOSIS — E785 Hyperlipidemia, unspecified: Secondary | ICD-10-CM | POA: Diagnosis not present

## 2019-03-25 DIAGNOSIS — K219 Gastro-esophageal reflux disease without esophagitis: Secondary | ICD-10-CM | POA: Diagnosis not present

## 2019-03-25 DIAGNOSIS — E785 Hyperlipidemia, unspecified: Secondary | ICD-10-CM | POA: Diagnosis not present

## 2019-03-29 DIAGNOSIS — M961 Postlaminectomy syndrome, not elsewhere classified: Secondary | ICD-10-CM | POA: Diagnosis not present

## 2019-03-29 DIAGNOSIS — M5416 Radiculopathy, lumbar region: Secondary | ICD-10-CM | POA: Diagnosis not present

## 2019-04-24 DIAGNOSIS — E785 Hyperlipidemia, unspecified: Secondary | ICD-10-CM | POA: Diagnosis not present

## 2019-04-24 DIAGNOSIS — I10 Essential (primary) hypertension: Secondary | ICD-10-CM | POA: Diagnosis not present

## 2019-05-27 DIAGNOSIS — M5416 Radiculopathy, lumbar region: Secondary | ICD-10-CM | POA: Diagnosis not present

## 2019-05-27 DIAGNOSIS — M961 Postlaminectomy syndrome, not elsewhere classified: Secondary | ICD-10-CM | POA: Diagnosis not present

## 2019-05-27 DIAGNOSIS — M25559 Pain in unspecified hip: Secondary | ICD-10-CM | POA: Diagnosis not present

## 2019-05-31 DIAGNOSIS — M797 Fibromyalgia: Secondary | ICD-10-CM | POA: Diagnosis not present

## 2019-05-31 DIAGNOSIS — Z6821 Body mass index (BMI) 21.0-21.9, adult: Secondary | ICD-10-CM | POA: Diagnosis not present

## 2019-07-26 DIAGNOSIS — M47816 Spondylosis without myelopathy or radiculopathy, lumbar region: Secondary | ICD-10-CM | POA: Diagnosis not present

## 2019-08-06 DIAGNOSIS — Z79899 Other long term (current) drug therapy: Secondary | ICD-10-CM | POA: Diagnosis not present

## 2019-08-06 DIAGNOSIS — K219 Gastro-esophageal reflux disease without esophagitis: Secondary | ICD-10-CM | POA: Diagnosis not present

## 2019-08-06 DIAGNOSIS — R0602 Shortness of breath: Secondary | ICD-10-CM | POA: Diagnosis not present

## 2019-08-06 DIAGNOSIS — R918 Other nonspecific abnormal finding of lung field: Secondary | ICD-10-CM | POA: Diagnosis not present

## 2019-08-09 DIAGNOSIS — D649 Anemia, unspecified: Secondary | ICD-10-CM | POA: Diagnosis not present

## 2019-09-22 DIAGNOSIS — M5416 Radiculopathy, lumbar region: Secondary | ICD-10-CM | POA: Diagnosis not present

## 2019-09-22 DIAGNOSIS — G894 Chronic pain syndrome: Secondary | ICD-10-CM

## 2019-09-22 DIAGNOSIS — M47816 Spondylosis without myelopathy or radiculopathy, lumbar region: Secondary | ICD-10-CM | POA: Diagnosis not present

## 2019-09-22 DIAGNOSIS — M961 Postlaminectomy syndrome, not elsewhere classified: Secondary | ICD-10-CM | POA: Diagnosis not present

## 2019-09-22 HISTORY — DX: Chronic pain syndrome: G89.4

## 2019-09-25 DIAGNOSIS — K219 Gastro-esophageal reflux disease without esophagitis: Secondary | ICD-10-CM | POA: Diagnosis not present

## 2019-09-25 DIAGNOSIS — E039 Hypothyroidism, unspecified: Secondary | ICD-10-CM | POA: Diagnosis not present

## 2019-09-25 DIAGNOSIS — E785 Hyperlipidemia, unspecified: Secondary | ICD-10-CM | POA: Diagnosis not present

## 2019-10-09 DIAGNOSIS — S199XXA Unspecified injury of neck, initial encounter: Secondary | ICD-10-CM | POA: Diagnosis not present

## 2019-10-09 DIAGNOSIS — Z23 Encounter for immunization: Secondary | ICD-10-CM | POA: Diagnosis not present

## 2019-10-09 DIAGNOSIS — S0101XA Laceration without foreign body of scalp, initial encounter: Secondary | ICD-10-CM | POA: Diagnosis not present

## 2019-10-09 DIAGNOSIS — S0181XA Laceration without foreign body of other part of head, initial encounter: Secondary | ICD-10-CM | POA: Diagnosis not present

## 2019-10-10 DIAGNOSIS — S0101XA Laceration without foreign body of scalp, initial encounter: Secondary | ICD-10-CM | POA: Diagnosis not present

## 2019-10-10 DIAGNOSIS — S199XXA Unspecified injury of neck, initial encounter: Secondary | ICD-10-CM | POA: Diagnosis not present

## 2019-10-31 DIAGNOSIS — J449 Chronic obstructive pulmonary disease, unspecified: Secondary | ICD-10-CM | POA: Diagnosis not present

## 2019-10-31 DIAGNOSIS — R112 Nausea with vomiting, unspecified: Secondary | ICD-10-CM | POA: Diagnosis not present

## 2019-10-31 DIAGNOSIS — R531 Weakness: Secondary | ICD-10-CM | POA: Diagnosis not present

## 2019-10-31 DIAGNOSIS — R918 Other nonspecific abnormal finding of lung field: Secondary | ICD-10-CM | POA: Diagnosis not present

## 2019-10-31 DIAGNOSIS — Z79899 Other long term (current) drug therapy: Secondary | ICD-10-CM | POA: Diagnosis not present

## 2019-10-31 DIAGNOSIS — R778 Other specified abnormalities of plasma proteins: Secondary | ICD-10-CM | POA: Diagnosis not present

## 2019-10-31 DIAGNOSIS — N19 Unspecified kidney failure: Secondary | ICD-10-CM | POA: Diagnosis not present

## 2019-10-31 DIAGNOSIS — I10 Essential (primary) hypertension: Secondary | ICD-10-CM | POA: Diagnosis not present

## 2019-10-31 DIAGNOSIS — R0602 Shortness of breath: Secondary | ICD-10-CM | POA: Diagnosis not present

## 2019-10-31 DIAGNOSIS — R110 Nausea: Secondary | ICD-10-CM | POA: Diagnosis not present

## 2019-10-31 DIAGNOSIS — E7800 Pure hypercholesterolemia, unspecified: Secondary | ICD-10-CM | POA: Diagnosis not present

## 2019-10-31 DIAGNOSIS — G8929 Other chronic pain: Secondary | ICD-10-CM | POA: Diagnosis not present

## 2019-10-31 DIAGNOSIS — Z743 Need for continuous supervision: Secondary | ICD-10-CM | POA: Diagnosis not present

## 2019-11-08 DIAGNOSIS — M47816 Spondylosis without myelopathy or radiculopathy, lumbar region: Secondary | ICD-10-CM | POA: Insufficient documentation

## 2019-11-08 HISTORY — DX: Spondylosis without myelopathy or radiculopathy, lumbar region: M47.816

## 2019-11-25 DIAGNOSIS — K219 Gastro-esophageal reflux disease without esophagitis: Secondary | ICD-10-CM | POA: Diagnosis not present

## 2019-11-25 DIAGNOSIS — E039 Hypothyroidism, unspecified: Secondary | ICD-10-CM | POA: Diagnosis not present

## 2019-12-09 DIAGNOSIS — R1031 Right lower quadrant pain: Secondary | ICD-10-CM | POA: Diagnosis not present

## 2019-12-09 DIAGNOSIS — R112 Nausea with vomiting, unspecified: Secondary | ICD-10-CM | POA: Diagnosis not present

## 2019-12-10 ENCOUNTER — Encounter: Payer: Self-pay | Admitting: Gastroenterology

## 2019-12-15 ENCOUNTER — Encounter: Payer: Self-pay | Admitting: Gastroenterology

## 2019-12-15 DIAGNOSIS — Z029 Encounter for administrative examinations, unspecified: Secondary | ICD-10-CM | POA: Diagnosis not present

## 2019-12-15 DIAGNOSIS — M961 Postlaminectomy syndrome, not elsewhere classified: Secondary | ICD-10-CM | POA: Diagnosis not present

## 2019-12-15 DIAGNOSIS — M47816 Spondylosis without myelopathy or radiculopathy, lumbar region: Secondary | ICD-10-CM | POA: Diagnosis not present

## 2019-12-15 DIAGNOSIS — G894 Chronic pain syndrome: Secondary | ICD-10-CM | POA: Diagnosis not present

## 2019-12-16 DIAGNOSIS — Z6821 Body mass index (BMI) 21.0-21.9, adult: Secondary | ICD-10-CM | POA: Diagnosis not present

## 2019-12-16 DIAGNOSIS — B370 Candidal stomatitis: Secondary | ICD-10-CM | POA: Diagnosis not present

## 2019-12-27 ENCOUNTER — Ambulatory Visit: Payer: Medicare Other | Admitting: Gastroenterology

## 2019-12-28 DIAGNOSIS — M47816 Spondylosis without myelopathy or radiculopathy, lumbar region: Secondary | ICD-10-CM | POA: Diagnosis not present

## 2020-02-19 DIAGNOSIS — R410 Disorientation, unspecified: Secondary | ICD-10-CM | POA: Diagnosis not present

## 2020-02-19 DIAGNOSIS — J449 Chronic obstructive pulmonary disease, unspecified: Secondary | ICD-10-CM | POA: Diagnosis not present

## 2020-02-19 DIAGNOSIS — M545 Low back pain: Secondary | ICD-10-CM | POA: Diagnosis not present

## 2020-02-19 DIAGNOSIS — Z87440 Personal history of urinary (tract) infections: Secondary | ICD-10-CM | POA: Diagnosis not present

## 2020-02-19 DIAGNOSIS — E785 Hyperlipidemia, unspecified: Secondary | ICD-10-CM | POA: Diagnosis not present

## 2020-02-19 DIAGNOSIS — M810 Age-related osteoporosis without current pathological fracture: Secondary | ICD-10-CM | POA: Diagnosis not present

## 2020-02-19 DIAGNOSIS — R404 Transient alteration of awareness: Secondary | ICD-10-CM | POA: Diagnosis not present

## 2020-02-19 DIAGNOSIS — K219 Gastro-esophageal reflux disease without esophagitis: Secondary | ICD-10-CM | POA: Diagnosis not present

## 2020-02-19 DIAGNOSIS — E161 Other hypoglycemia: Secondary | ICD-10-CM | POA: Diagnosis not present

## 2020-02-19 DIAGNOSIS — M1990 Unspecified osteoarthritis, unspecified site: Secondary | ICD-10-CM | POA: Diagnosis not present

## 2020-02-19 DIAGNOSIS — R220 Localized swelling, mass and lump, head: Secondary | ICD-10-CM | POA: Diagnosis not present

## 2020-02-19 DIAGNOSIS — N189 Chronic kidney disease, unspecified: Secondary | ICD-10-CM | POA: Diagnosis not present

## 2020-02-19 DIAGNOSIS — E039 Hypothyroidism, unspecified: Secondary | ICD-10-CM | POA: Diagnosis not present

## 2020-02-19 DIAGNOSIS — R55 Syncope and collapse: Secondary | ICD-10-CM | POA: Diagnosis not present

## 2020-02-19 DIAGNOSIS — E162 Hypoglycemia, unspecified: Secondary | ICD-10-CM | POA: Diagnosis not present

## 2020-02-19 DIAGNOSIS — D509 Iron deficiency anemia, unspecified: Secondary | ICD-10-CM | POA: Diagnosis not present

## 2020-02-19 DIAGNOSIS — Z743 Need for continuous supervision: Secondary | ICD-10-CM | POA: Diagnosis not present

## 2020-02-19 DIAGNOSIS — G8929 Other chronic pain: Secondary | ICD-10-CM | POA: Diagnosis not present

## 2020-02-19 DIAGNOSIS — I10 Essential (primary) hypertension: Secondary | ICD-10-CM | POA: Diagnosis not present

## 2020-02-19 DIAGNOSIS — Z79899 Other long term (current) drug therapy: Secondary | ICD-10-CM | POA: Diagnosis not present

## 2020-02-19 DIAGNOSIS — D631 Anemia in chronic kidney disease: Secondary | ICD-10-CM | POA: Diagnosis not present

## 2020-02-20 DIAGNOSIS — G894 Chronic pain syndrome: Secondary | ICD-10-CM | POA: Diagnosis not present

## 2020-02-20 DIAGNOSIS — I10 Essential (primary) hypertension: Secondary | ICD-10-CM

## 2020-02-20 DIAGNOSIS — K219 Gastro-esophageal reflux disease without esophagitis: Secondary | ICD-10-CM | POA: Diagnosis not present

## 2020-02-20 DIAGNOSIS — I340 Nonrheumatic mitral (valve) insufficiency: Secondary | ICD-10-CM | POA: Diagnosis not present

## 2020-02-20 DIAGNOSIS — R410 Disorientation, unspecified: Secondary | ICD-10-CM | POA: Diagnosis not present

## 2020-02-20 DIAGNOSIS — R296 Repeated falls: Secondary | ICD-10-CM | POA: Diagnosis not present

## 2020-02-21 DIAGNOSIS — R410 Disorientation, unspecified: Secondary | ICD-10-CM | POA: Diagnosis not present

## 2020-02-21 DIAGNOSIS — G894 Chronic pain syndrome: Secondary | ICD-10-CM | POA: Diagnosis not present

## 2020-02-21 DIAGNOSIS — I10 Essential (primary) hypertension: Secondary | ICD-10-CM | POA: Diagnosis not present

## 2020-02-21 DIAGNOSIS — R296 Repeated falls: Secondary | ICD-10-CM | POA: Diagnosis not present

## 2020-02-21 DIAGNOSIS — K219 Gastro-esophageal reflux disease without esophagitis: Secondary | ICD-10-CM | POA: Diagnosis not present

## 2020-02-23 DIAGNOSIS — I10 Essential (primary) hypertension: Secondary | ICD-10-CM | POA: Diagnosis not present

## 2020-02-23 DIAGNOSIS — K219 Gastro-esophageal reflux disease without esophagitis: Secondary | ICD-10-CM | POA: Diagnosis not present

## 2020-02-23 DIAGNOSIS — E785 Hyperlipidemia, unspecified: Secondary | ICD-10-CM | POA: Diagnosis not present

## 2020-03-02 DIAGNOSIS — Z6821 Body mass index (BMI) 21.0-21.9, adult: Secondary | ICD-10-CM | POA: Diagnosis not present

## 2020-03-02 DIAGNOSIS — E039 Hypothyroidism, unspecified: Secondary | ICD-10-CM | POA: Diagnosis not present

## 2020-03-02 DIAGNOSIS — R413 Other amnesia: Secondary | ICD-10-CM | POA: Diagnosis not present

## 2020-03-02 DIAGNOSIS — M109 Gout, unspecified: Secondary | ICD-10-CM | POA: Diagnosis not present

## 2020-03-02 DIAGNOSIS — E785 Hyperlipidemia, unspecified: Secondary | ICD-10-CM | POA: Diagnosis not present

## 2020-03-02 DIAGNOSIS — R6889 Other general symptoms and signs: Secondary | ICD-10-CM | POA: Diagnosis not present

## 2020-03-02 DIAGNOSIS — Z0000 Encounter for general adult medical examination without abnormal findings: Secondary | ICD-10-CM | POA: Diagnosis not present

## 2020-03-02 DIAGNOSIS — Z79899 Other long term (current) drug therapy: Secondary | ICD-10-CM | POA: Diagnosis not present

## 2020-03-24 DIAGNOSIS — E785 Hyperlipidemia, unspecified: Secondary | ICD-10-CM | POA: Diagnosis not present

## 2020-03-24 DIAGNOSIS — M8580 Other specified disorders of bone density and structure, unspecified site: Secondary | ICD-10-CM | POA: Diagnosis not present

## 2020-04-04 DIAGNOSIS — R112 Nausea with vomiting, unspecified: Secondary | ICD-10-CM | POA: Diagnosis not present

## 2020-04-07 ENCOUNTER — Telehealth: Payer: Self-pay

## 2020-04-07 ENCOUNTER — Other Ambulatory Visit: Payer: Self-pay

## 2020-04-07 ENCOUNTER — Encounter: Payer: Self-pay | Admitting: Gastroenterology

## 2020-04-07 ENCOUNTER — Ambulatory Visit (INDEPENDENT_AMBULATORY_CARE_PROVIDER_SITE_OTHER): Payer: Medicare Other | Admitting: Gastroenterology

## 2020-04-07 VITALS — BP 118/68 | HR 66 | Temp 97.1°F | Ht 62.0 in | Wt 107.0 lb

## 2020-04-07 DIAGNOSIS — Z01818 Encounter for other preprocedural examination: Secondary | ICD-10-CM

## 2020-04-07 DIAGNOSIS — R9389 Abnormal findings on diagnostic imaging of other specified body structures: Secondary | ICD-10-CM | POA: Diagnosis not present

## 2020-04-07 DIAGNOSIS — R634 Abnormal weight loss: Secondary | ICD-10-CM

## 2020-04-07 DIAGNOSIS — K5909 Other constipation: Secondary | ICD-10-CM | POA: Diagnosis not present

## 2020-04-07 DIAGNOSIS — R112 Nausea with vomiting, unspecified: Secondary | ICD-10-CM

## 2020-04-07 MED ORDER — PROMETHAZINE HCL 25 MG PO TABS: 25.0000 mg | ORAL_TABLET | Freq: Four times a day (QID) | ORAL | 0 refills | Status: DC | PRN

## 2020-04-07 MED ORDER — PANTOPRAZOLE SODIUM 40 MG PO TBEC: 40.0000 mg | DELAYED_RELEASE_TABLET | Freq: Two times a day (BID) | ORAL | 3 refills | Status: DC

## 2020-04-07 NOTE — Telephone Encounter (Signed)
Called and left patient a voicemail.  CT scan at [facility] on  Wednesday 5-19 at 2:30pm arrival time. Will have labs drawn the same day. Actual appointment for CT will be at 3:30pm. No solid foods after 1:30pm and patient needs to pick up kit by Tuesday at 4:15pm at the outpatient center

## 2020-04-07 NOTE — Telephone Encounter (Signed)
Patient returned your call, please call patient one more time.   

## 2020-04-07 NOTE — Telephone Encounter (Signed)
Called patient and gave her the information.

## 2020-04-07 NOTE — Patient Instructions (Signed)
If you are age 72 or older, your body mass index should be between 23-30. Your Body mass index is 19.57 kg/m. If this is out of the aforementioned range listed, please consider follow up with your Primary Care Provider.  If you are age 39 or younger, your body mass index should be between 19-25. Your Body mass index is 19.57 kg/m. If this is out of the aformentioned range listed, please consider follow up with your Primary Care Provider.   Your provider has ordered a CT Scan and labs at [facility], you will be contacted with an appointment for this.   You have been scheduled for an endoscopy. Please follow written instructions given to you at your visit today. If you use inhalers (even only as needed), please bring them with you on the day of your procedure. Your physician has requested that you go to www.startemmi.com and enter the access code given to you at your visit today. This web site gives a general overview about your procedure. However, you should still follow specific instructions given to you by our office regarding your preparation for the procedure.  Follow up in 6-8 weeks.   Thank you,  Dr. Jackquline Denmark
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
## 2020-04-10 NOTE — Telephone Encounter (Signed)
Has more questions regarding CT Scan on 04-12-20. Left a message with answering service Saturday asking for a return call today Monday at [phone].

## 2020-04-10 NOTE — Telephone Encounter (Signed)
Spent 26 minutes on the phone. Told patient what to do multiple times. Told patient to go over paper work with someone as well and then if they both have questions then to call us back and have both present to speak to me

## 2020-04-12 ENCOUNTER — Encounter: Payer: Self-pay | Admitting: Gastroenterology

## 2020-04-12 DIAGNOSIS — R112 Nausea with vomiting, unspecified: Secondary | ICD-10-CM | POA: Diagnosis not present

## 2020-04-12 DIAGNOSIS — I700 Atherosclerosis of aorta: Secondary | ICD-10-CM | POA: Diagnosis not present

## 2020-04-12 DIAGNOSIS — R1110 Vomiting, unspecified: Secondary | ICD-10-CM | POA: Diagnosis not present

## 2020-04-12 DIAGNOSIS — J479 Bronchiectasis, uncomplicated: Secondary | ICD-10-CM | POA: Diagnosis not present

## 2020-04-12 DIAGNOSIS — K5909 Other constipation: Secondary | ICD-10-CM | POA: Diagnosis not present

## 2020-04-24 DIAGNOSIS — I10 Essential (primary) hypertension: Secondary | ICD-10-CM | POA: Diagnosis not present

## 2020-04-24 DIAGNOSIS — E785 Hyperlipidemia, unspecified: Secondary | ICD-10-CM | POA: Diagnosis not present

## 2020-04-24 DIAGNOSIS — E039 Hypothyroidism, unspecified: Secondary | ICD-10-CM | POA: Diagnosis not present

## 2020-04-24 DIAGNOSIS — M810 Age-related osteoporosis without current pathological fracture: Secondary | ICD-10-CM | POA: Diagnosis not present

## 2020-04-25 ENCOUNTER — Telehealth: Payer: Self-pay

## 2020-04-25 NOTE — Addendum Note (Signed)
Addended by: Curlene Labrum E on: 04/25/2020 01:55 PM   Modules accepted: Orders

## 2020-04-25 NOTE — Telephone Encounter (Signed)
Referral has been placed for patient to see a pulmonologist due to lasted CT report on 5-19 for bronchiectasis. Faxed lasted ct scan to [phone]. Pulmonary will call patient per office

## 2020-05-12 ENCOUNTER — Ambulatory Visit (INDEPENDENT_AMBULATORY_CARE_PROVIDER_SITE_OTHER): Payer: Medicare Other

## 2020-05-12 ENCOUNTER — Other Ambulatory Visit: Payer: Self-pay | Admitting: Gastroenterology

## 2020-05-12 DIAGNOSIS — Z1159 Encounter for screening for other viral diseases: Secondary | ICD-10-CM

## 2020-05-12 NOTE — Telephone Encounter (Signed)
Called Pulmonary and they confirmed they have received referral and will call patient to schedule

## 2020-05-13 LAB — SARS CORONAVIRUS 2 (TAT 6-24 HRS): SARS Coronavirus 2: NEGATIVE

## 2020-05-16 ENCOUNTER — Other Ambulatory Visit: Payer: Self-pay

## 2020-05-16 ENCOUNTER — Ambulatory Visit (AMBULATORY_SURGERY_CENTER): Payer: Medicare Other | Admitting: Gastroenterology

## 2020-05-16 ENCOUNTER — Encounter: Payer: Self-pay | Admitting: Gastroenterology

## 2020-05-16 VITALS — BP 129/50 | HR 64 | Temp 96.6°F | Resp 12 | Ht 62.0 in | Wt 107.0 lb

## 2020-05-16 DIAGNOSIS — R9389 Abnormal findings on diagnostic imaging of other specified body structures: Secondary | ICD-10-CM

## 2020-05-16 DIAGNOSIS — K449 Diaphragmatic hernia without obstruction or gangrene: Secondary | ICD-10-CM

## 2020-05-16 DIAGNOSIS — R112 Nausea with vomiting, unspecified: Secondary | ICD-10-CM

## 2020-05-16 DIAGNOSIS — K319 Disease of stomach and duodenum, unspecified: Secondary | ICD-10-CM | POA: Diagnosis not present

## 2020-05-16 DIAGNOSIS — K3189 Other diseases of stomach and duodenum: Secondary | ICD-10-CM | POA: Diagnosis not present

## 2020-05-16 DIAGNOSIS — R634 Abnormal weight loss: Secondary | ICD-10-CM

## 2020-05-16 DIAGNOSIS — K2970 Gastritis, unspecified, without bleeding: Secondary | ICD-10-CM

## 2020-05-16 HISTORY — PX: UPPER GASTROINTESTINAL ENDOSCOPY: SHX188

## 2020-05-16 MED ORDER — SODIUM CHLORIDE 0.9 % IV SOLN: 500.0000 mL | Freq: Once | INTRAVENOUS | Status: DC

## 2020-05-16 NOTE — Progress Notes (Signed)
PT taken to PACU. Monitors in place. VSS. Report given to RN. 

## 2020-05-16 NOTE — Patient Instructions (Signed)
Please read handouts provided. Await pathology results. Continue present medications. Return to GI clinic in 12 weeks. Avoid ibuprofen, naproxen, or other non-steriodal anti-inflammatory drugs.     YOU HAD AN ENDOSCOPIC PROCEDURE TODAY AT [facility]:   Refer to the procedure report that was given to you for any specific questions about what was found during the examination.  If the procedure report does not answer your questions, please call your gastroenterologist to clarify.  If you requested that your care partner not be given the details of your procedure findings, then the procedure report has been included in a sealed envelope for you to review at your convenience later.  YOU SHOULD EXPECT: Some feelings of bloating in the abdomen. Passage of more gas than usual.  Walking can help get rid of the air that was put into your GI tract during the procedure and reduce the bloating. If you had a lower endoscopy (such as a colonoscopy or flexible sigmoidoscopy) you may notice spotting of blood in your stool or on the toilet paper. If you underwent a bowel prep for your procedure, you may not have a normal bowel movement for a few days.  Please Note:  You might notice some irritation and congestion in your nose or some drainage.  This is from the oxygen used during your procedure.  There is no need for concern and it should clear up in a day or so.  SYMPTOMS TO REPORT IMMEDIATELY:     Following upper endoscopy (EGD)  Vomiting of blood or coffee ground material  New chest pain or pain under the shoulder blades  Painful or persistently difficult swallowing  New shortness of breath  Fever of 100F or higher  Black, tarry-looking stools  For urgent or emergent issues, a gastroenterologist can be reached at any hour by calling [phone]. Do not use MyChart messaging for urgent concerns.    DIET:  We do recommend a small meal at first, but then you may proceed to your  regular diet.  Drink plenty of fluids but you should avoid alcoholic beverages for 24 hours.  ACTIVITY:  You should plan to take it easy for the rest of today and you should NOT DRIVE or use heavy machinery until tomorrow (because of the sedation medicines used during the test).    FOLLOW UP: Our staff will call the number listed on your records 48-72 hours following your procedure to check on you and address any questions or concerns that you may have regarding the information given to you following your procedure. If we do not reach you, we will leave a message.  We will attempt to reach you two times.  During this call, we will ask if you have developed any symptoms of COVID 19. If you develop any symptoms (ie: fever, flu-like symptoms, shortness of breath, cough etc.) before then, please call [phone].  If you test positive for Covid 19 in the 2 weeks post procedure, please call and report this information to [place].    If any biopsies were taken you will be contacted by phone or by letter within the next 1-3 weeks.  Please call us at [phone] if you have not heard about the biopsies in 3 weeks.    SIGNATURES/CONFIDENTIALITY: You and/or your care partner have signed paperwork which will be entered into your electronic medical record.  These signatures attest to the fact that that the information above on your After Visit Summary has been reviewed and is  understood.  Full responsibility of the confidentiality of this discharge information lies with you and/or your care-partner. 

## 2020-05-16 NOTE — Progress Notes (Signed)
Pt's states no medical or surgical changes since previsit or office visit.  Vitals SP 

## 2020-05-16 NOTE — Progress Notes (Signed)
Called to room to assist during endoscopic procedure.  Patient ID and intended procedure confirmed with present staff. Received instructions for my participation in the procedure from the performing physician.  

## 2020-05-16 NOTE — Op Note (Signed)
[facility] Patient Name: Samantha Fletcher Procedure Date: 05/16/2020 10:12 AM MRN: [id number] Endoscopist: Jackquline Denmark , MD Age: 74 Referring MD:  Date of Birth: 04-16-1946 Gender: Female Account #: [id number] Procedure:                Upper GI endoscopy Indications:              Epigastric abdominal pain with intermittent                            nausea/vomiting. Medicines:                Monitored Anesthesia Care Procedure:                Pre-Anesthesia Assessment:                           - Prior to the procedure, a History and Physical                            was performed, and patient medications and                            allergies were reviewed. The patient's tolerance of                            previous anesthesia was also reviewed. The risks                            and benefits of the procedure and the sedation                            options and risks were discussed with the patient.                            All questions were answered, and informed consent                            was obtained. Prior Anticoagulants: The patient has                            taken no previous anticoagulant or antiplatelet                            agents. ASA Grade Assessment: III - A patient with                            severe systemic disease. After reviewing the risks                            and benefits, the patient was deemed in                            satisfactory condition to undergo the procedure.  After obtaining informed consent, the endoscope was                            passed under direct vision. Throughout the                            procedure, the patient's blood pressure, pulse, and                            oxygen saturations were monitored continuously. The                            Endoscope was introduced through the mouth, and                            advanced to the second part of duodenum. The  upper                            GI endoscopy was accomplished without difficulty.                            The patient tolerated the procedure well. Scope In: Scope Out: Findings:                 The examined esophagus was tortuous but normal. A                            transient wide open Schatzki's ring was noted at GE                            junction 34 cm from the incisors. Examined by NBI.                           A small hiatal hernia was present.                           Some retained food and medications limiting                            examination. Localized mild inflammation                            characterized by erythema was found in the gastric                            antrum. Biopsies were taken with a cold forceps for                            histology. No outlet obstruction.                           The examined duodenum was normal. Biopsies for  histology were taken with a cold forceps for                            evaluation of celiac disease. Complications:            No immediate complications. Estimated Blood Loss:     Estimated blood loss: none. Impression:               - Small hiatal hernia.                           - Gastritis. Biopsied.                           - Some retained food in the stomach without                            mechanical outlet obstruction. Recommendation:           - Patient has a contact number available for                            emergencies. The signs and symptoms of potential                            delayed complications were discussed with the                            patient. Return to normal activities tomorrow.                            Written discharge instructions were provided to the                            patient.                           - Resume previous diet.                           - Continue present medications.                           - Await  pathology results.                           - Avoid ibuprofen, naproxen, or other non-steroidal                            anti-inflammatory drugs.                           - Return to GI clinic in 12 weeks. If still with                            problems, will perform further work-up.                           -  I have been able to review previous CT scan                            report Abdo/pelvis performed in [facility].                            It did not show any acute abnormalities. Jackquline Denmark, MD 05/16/2020 11:13:35 AM This report has been signed electronically.

## 2020-05-17 DIAGNOSIS — M961 Postlaminectomy syndrome, not elsewhere classified: Secondary | ICD-10-CM | POA: Diagnosis not present

## 2020-05-17 DIAGNOSIS — Z79899 Other long term (current) drug therapy: Secondary | ICD-10-CM | POA: Diagnosis not present

## 2020-05-17 DIAGNOSIS — G894 Chronic pain syndrome: Secondary | ICD-10-CM | POA: Diagnosis not present

## 2020-05-17 DIAGNOSIS — Z029 Encounter for administrative examinations, unspecified: Secondary | ICD-10-CM | POA: Diagnosis not present

## 2020-05-17 DIAGNOSIS — M5416 Radiculopathy, lumbar region: Secondary | ICD-10-CM | POA: Diagnosis not present

## 2020-05-18 ENCOUNTER — Telehealth: Payer: Self-pay

## 2020-05-18 NOTE — Telephone Encounter (Signed)
  Follow up Call-  Call back number 05/16/2020  Post procedure Call Back phone  # [phone]  Permission to leave phone message Yes  Some recent data might be hidden     Patient questions:  Do you have a fever, pain , or abdominal swelling? No. Pain Score  0 *  Have you tolerated food without any problems? Yes.    Have you been able to return to your normal activities? Yes.    Do you have any questions about your discharge instructions: Diet   No. Medications  No. Follow up visit  No.  Do you have questions or concerns about your Care? No.  Actions: * If pain score is 4 or above: No action needed, pain <4.  Have you developed a fever since your procedure? No 2.   Have you had an respiratory symptoms (SOB or cough) since your procedure? No 3.   Have you tested positive for COVID 19 since your procedure No 4.   Have you had any family members/close contacts diagnosed with the COVID 19 since your procedure?  No  If yes to any of these questions please route to Joylene John, RN and Erenest Rasher, RN

## 2020-05-21 ENCOUNTER — Encounter: Payer: Self-pay | Admitting: Gastroenterology

## 2020-06-01 ENCOUNTER — Telehealth: Payer: Self-pay

## 2020-06-01 NOTE — Telephone Encounter (Signed)
Also spoke with Lavella Lemons at [facility] at [facility] and told them to please try to get this patient in due to her dx and she told me they have been extremely busy but they will do their best. Number is [phone]

## 2020-06-01 NOTE — Telephone Encounter (Signed)
LVM for patient to call  Please schedule her a follow up appointment in September

## 2020-06-01 NOTE — Telephone Encounter (Signed)
Patient has refused to be seen by Pulmonary Care per [facility]. Patient said that Dr Lyndel Safe knows what is going on with her.

## 2020-06-02 NOTE — Telephone Encounter (Signed)
Patient scheduled for 9-14 at 2:30pm

## 2020-06-06 NOTE — Telephone Encounter (Signed)
Dr Lyndel Safe was made aware that patient refused her appointment with pulmonary yesterday during clinic

## 2020-06-22 ENCOUNTER — Institutional Professional Consult (permissible substitution): Payer: Medicare Other | Admitting: Internal Medicine

## 2020-08-08 ENCOUNTER — Ambulatory Visit: Payer: Medicare Other | Admitting: Gastroenterology

## 2020-08-17 DIAGNOSIS — M961 Postlaminectomy syndrome, not elsewhere classified: Secondary | ICD-10-CM | POA: Diagnosis not present

## 2020-08-17 DIAGNOSIS — G894 Chronic pain syndrome: Secondary | ICD-10-CM | POA: Diagnosis not present

## 2020-08-17 DIAGNOSIS — M5416 Radiculopathy, lumbar region: Secondary | ICD-10-CM | POA: Diagnosis not present

## 2020-09-13 DIAGNOSIS — M961 Postlaminectomy syndrome, not elsewhere classified: Secondary | ICD-10-CM | POA: Diagnosis not present

## 2020-09-13 DIAGNOSIS — G894 Chronic pain syndrome: Secondary | ICD-10-CM | POA: Diagnosis not present

## 2020-09-23 DIAGNOSIS — E7800 Pure hypercholesterolemia, unspecified: Secondary | ICD-10-CM | POA: Diagnosis not present

## 2020-09-23 DIAGNOSIS — I10 Essential (primary) hypertension: Secondary | ICD-10-CM | POA: Diagnosis not present

## 2020-10-24 DIAGNOSIS — E7800 Pure hypercholesterolemia, unspecified: Secondary | ICD-10-CM | POA: Diagnosis not present

## 2020-10-24 DIAGNOSIS — I10 Essential (primary) hypertension: Secondary | ICD-10-CM | POA: Diagnosis not present

## 2020-11-22 DIAGNOSIS — J40 Bronchitis, not specified as acute or chronic: Secondary | ICD-10-CM | POA: Diagnosis not present

## 2020-11-22 DIAGNOSIS — E039 Hypothyroidism, unspecified: Secondary | ICD-10-CM | POA: Diagnosis not present

## 2020-11-22 DIAGNOSIS — M109 Gout, unspecified: Secondary | ICD-10-CM | POA: Diagnosis not present

## 2020-11-22 DIAGNOSIS — E785 Hyperlipidemia, unspecified: Secondary | ICD-10-CM | POA: Diagnosis not present

## 2020-11-22 DIAGNOSIS — Z6824 Body mass index (BMI) 24.0-24.9, adult: Secondary | ICD-10-CM | POA: Diagnosis not present

## 2020-11-22 DIAGNOSIS — Z79899 Other long term (current) drug therapy: Secondary | ICD-10-CM | POA: Diagnosis not present

## 2020-11-23 DIAGNOSIS — E785 Hyperlipidemia, unspecified: Secondary | ICD-10-CM | POA: Diagnosis not present

## 2020-11-23 DIAGNOSIS — G4700 Insomnia, unspecified: Secondary | ICD-10-CM | POA: Diagnosis not present

## 2020-12-07 DIAGNOSIS — Z1211 Encounter for screening for malignant neoplasm of colon: Secondary | ICD-10-CM | POA: Diagnosis not present

## 2021-01-11 DIAGNOSIS — M549 Dorsalgia, unspecified: Secondary | ICD-10-CM | POA: Diagnosis not present

## 2021-01-11 DIAGNOSIS — M797 Fibromyalgia: Secondary | ICD-10-CM | POA: Diagnosis not present

## 2021-01-11 DIAGNOSIS — R6889 Other general symptoms and signs: Secondary | ICD-10-CM | POA: Diagnosis not present

## 2021-01-11 DIAGNOSIS — Z6824 Body mass index (BMI) 24.0-24.9, adult: Secondary | ICD-10-CM | POA: Diagnosis not present

## 2021-01-11 DIAGNOSIS — Z0000 Encounter for general adult medical examination without abnormal findings: Secondary | ICD-10-CM | POA: Diagnosis not present

## 2021-02-21 DIAGNOSIS — E785 Hyperlipidemia, unspecified: Secondary | ICD-10-CM | POA: Diagnosis not present

## 2021-02-21 DIAGNOSIS — E039 Hypothyroidism, unspecified: Secondary | ICD-10-CM | POA: Diagnosis not present

## 2021-02-21 DIAGNOSIS — M8580 Other specified disorders of bone density and structure, unspecified site: Secondary | ICD-10-CM | POA: Diagnosis not present

## 2021-02-28 DIAGNOSIS — M4316 Spondylolisthesis, lumbar region: Secondary | ICD-10-CM | POA: Diagnosis not present

## 2021-02-28 DIAGNOSIS — M4185 Other forms of scoliosis, thoracolumbar region: Secondary | ICD-10-CM | POA: Diagnosis not present

## 2021-02-28 DIAGNOSIS — M47814 Spondylosis without myelopathy or radiculopathy, thoracic region: Secondary | ICD-10-CM | POA: Diagnosis not present

## 2021-02-28 DIAGNOSIS — M545 Low back pain, unspecified: Secondary | ICD-10-CM | POA: Diagnosis not present

## 2021-02-28 DIAGNOSIS — M961 Postlaminectomy syndrome, not elsewhere classified: Secondary | ICD-10-CM | POA: Diagnosis not present

## 2021-02-28 DIAGNOSIS — I700 Atherosclerosis of aorta: Secondary | ICD-10-CM | POA: Diagnosis not present

## 2021-02-28 DIAGNOSIS — M5136 Other intervertebral disc degeneration, lumbar region: Secondary | ICD-10-CM | POA: Diagnosis not present

## 2021-02-28 DIAGNOSIS — Z462 Encounter for fitting and adjustment of other devices related to nervous system and special senses: Secondary | ICD-10-CM | POA: Diagnosis not present

## 2021-02-28 DIAGNOSIS — Z981 Arthrodesis status: Secondary | ICD-10-CM | POA: Diagnosis not present

## 2021-02-28 DIAGNOSIS — M4726 Other spondylosis with radiculopathy, lumbar region: Secondary | ICD-10-CM | POA: Diagnosis not present

## 2021-03-01 DIAGNOSIS — E785 Hyperlipidemia, unspecified: Secondary | ICD-10-CM | POA: Diagnosis not present

## 2021-03-01 DIAGNOSIS — D510 Vitamin B12 deficiency anemia due to intrinsic factor deficiency: Secondary | ICD-10-CM | POA: Diagnosis not present

## 2021-03-01 DIAGNOSIS — R5383 Other fatigue: Secondary | ICD-10-CM | POA: Diagnosis not present

## 2021-03-01 DIAGNOSIS — Z6824 Body mass index (BMI) 24.0-24.9, adult: Secondary | ICD-10-CM | POA: Diagnosis not present

## 2021-03-01 DIAGNOSIS — Z79899 Other long term (current) drug therapy: Secondary | ICD-10-CM | POA: Diagnosis not present

## 2021-03-01 DIAGNOSIS — E039 Hypothyroidism, unspecified: Secondary | ICD-10-CM | POA: Diagnosis not present

## 2021-03-01 DIAGNOSIS — J40 Bronchitis, not specified as acute or chronic: Secondary | ICD-10-CM | POA: Diagnosis not present

## 2021-03-09 DIAGNOSIS — M5416 Radiculopathy, lumbar region: Secondary | ICD-10-CM | POA: Diagnosis not present

## 2021-03-24 DIAGNOSIS — G4700 Insomnia, unspecified: Secondary | ICD-10-CM | POA: Diagnosis not present

## 2021-03-24 DIAGNOSIS — E785 Hyperlipidemia, unspecified: Secondary | ICD-10-CM | POA: Diagnosis not present

## 2021-04-05 DIAGNOSIS — M1712 Unilateral primary osteoarthritis, left knee: Secondary | ICD-10-CM | POA: Diagnosis not present

## 2021-04-05 DIAGNOSIS — M25512 Pain in left shoulder: Secondary | ICD-10-CM | POA: Diagnosis not present

## 2021-04-23 DIAGNOSIS — E785 Hyperlipidemia, unspecified: Secondary | ICD-10-CM | POA: Diagnosis not present

## 2021-04-23 DIAGNOSIS — G4700 Insomnia, unspecified: Secondary | ICD-10-CM | POA: Diagnosis not present

## 2021-05-24 DIAGNOSIS — E785 Hyperlipidemia, unspecified: Secondary | ICD-10-CM | POA: Diagnosis not present

## 2021-05-24 DIAGNOSIS — G4700 Insomnia, unspecified: Secondary | ICD-10-CM | POA: Diagnosis not present

## 2021-07-06 ENCOUNTER — Other Ambulatory Visit: Payer: Self-pay | Admitting: Gastroenterology

## 2021-07-06 DIAGNOSIS — K5909 Other constipation: Secondary | ICD-10-CM

## 2021-07-06 DIAGNOSIS — R112 Nausea with vomiting, unspecified: Secondary | ICD-10-CM

## 2021-07-06 DIAGNOSIS — R634 Abnormal weight loss: Secondary | ICD-10-CM

## 2021-07-25 DIAGNOSIS — E039 Hypothyroidism, unspecified: Secondary | ICD-10-CM | POA: Diagnosis not present

## 2021-07-25 DIAGNOSIS — E785 Hyperlipidemia, unspecified: Secondary | ICD-10-CM | POA: Diagnosis not present

## 2021-07-25 DIAGNOSIS — M8580 Other specified disorders of bone density and structure, unspecified site: Secondary | ICD-10-CM | POA: Diagnosis not present

## 2021-09-24 DIAGNOSIS — R5383 Other fatigue: Secondary | ICD-10-CM | POA: Diagnosis not present

## 2021-09-24 DIAGNOSIS — M797 Fibromyalgia: Secondary | ICD-10-CM | POA: Diagnosis not present

## 2021-09-24 DIAGNOSIS — E785 Hyperlipidemia, unspecified: Secondary | ICD-10-CM | POA: Diagnosis not present

## 2021-10-03 DIAGNOSIS — Z9689 Presence of other specified functional implants: Secondary | ICD-10-CM | POA: Diagnosis not present

## 2021-10-03 DIAGNOSIS — M5416 Radiculopathy, lumbar region: Secondary | ICD-10-CM | POA: Diagnosis not present

## 2021-10-03 DIAGNOSIS — M961 Postlaminectomy syndrome, not elsewhere classified: Secondary | ICD-10-CM | POA: Diagnosis not present

## 2021-10-03 DIAGNOSIS — M47816 Spondylosis without myelopathy or radiculopathy, lumbar region: Secondary | ICD-10-CM | POA: Diagnosis not present

## 2021-10-24 DIAGNOSIS — E785 Hyperlipidemia, unspecified: Secondary | ICD-10-CM | POA: Diagnosis not present

## 2021-10-24 DIAGNOSIS — M797 Fibromyalgia: Secondary | ICD-10-CM | POA: Diagnosis not present

## 2021-10-24 DIAGNOSIS — R5383 Other fatigue: Secondary | ICD-10-CM | POA: Diagnosis not present

## 2021-10-26 DIAGNOSIS — M5416 Radiculopathy, lumbar region: Secondary | ICD-10-CM | POA: Diagnosis not present

## 2021-10-26 DIAGNOSIS — M961 Postlaminectomy syndrome, not elsewhere classified: Secondary | ICD-10-CM | POA: Diagnosis not present

## 2021-10-26 DIAGNOSIS — Z9689 Presence of other specified functional implants: Secondary | ICD-10-CM | POA: Diagnosis not present

## 2021-10-26 DIAGNOSIS — M545 Low back pain, unspecified: Secondary | ICD-10-CM | POA: Diagnosis not present

## 2021-11-15 DIAGNOSIS — J329 Chronic sinusitis, unspecified: Secondary | ICD-10-CM | POA: Diagnosis not present

## 2021-11-15 DIAGNOSIS — J40 Bronchitis, not specified as acute or chronic: Secondary | ICD-10-CM | POA: Diagnosis not present

## 2021-11-15 DIAGNOSIS — R300 Dysuria: Secondary | ICD-10-CM | POA: Diagnosis not present

## 2021-11-15 DIAGNOSIS — Z681 Body mass index (BMI) 19 or less, adult: Secondary | ICD-10-CM | POA: Diagnosis not present

## 2021-11-15 DIAGNOSIS — Z0000 Encounter for general adult medical examination without abnormal findings: Secondary | ICD-10-CM | POA: Diagnosis not present

## 2021-11-20 DIAGNOSIS — R300 Dysuria: Secondary | ICD-10-CM | POA: Diagnosis not present

## 2022-01-07 DIAGNOSIS — R300 Dysuria: Secondary | ICD-10-CM | POA: Diagnosis not present

## 2022-01-08 DIAGNOSIS — N3001 Acute cystitis with hematuria: Secondary | ICD-10-CM | POA: Diagnosis not present

## 2022-01-08 DIAGNOSIS — Z6823 Body mass index (BMI) 23.0-23.9, adult: Secondary | ICD-10-CM | POA: Diagnosis not present

## 2022-02-24 DIAGNOSIS — Z23 Encounter for immunization: Secondary | ICD-10-CM | POA: Diagnosis not present

## 2022-02-24 DIAGNOSIS — N390 Urinary tract infection, site not specified: Secondary | ICD-10-CM | POA: Diagnosis not present

## 2022-02-24 DIAGNOSIS — Z743 Need for continuous supervision: Secondary | ICD-10-CM | POA: Diagnosis not present

## 2022-03-04 DIAGNOSIS — J40 Bronchitis, not specified as acute or chronic: Secondary | ICD-10-CM | POA: Diagnosis not present

## 2022-03-04 DIAGNOSIS — Z6822 Body mass index (BMI) 22.0-22.9, adult: Secondary | ICD-10-CM | POA: Diagnosis not present

## 2022-04-16 DIAGNOSIS — R350 Frequency of micturition: Secondary | ICD-10-CM | POA: Diagnosis not present

## 2022-05-09 DIAGNOSIS — I110 Hypertensive heart disease with heart failure: Secondary | ICD-10-CM | POA: Diagnosis not present

## 2022-05-09 DIAGNOSIS — Z20822 Contact with and (suspected) exposure to covid-19: Secondary | ICD-10-CM | POA: Diagnosis not present

## 2022-05-09 DIAGNOSIS — I509 Heart failure, unspecified: Secondary | ICD-10-CM | POA: Diagnosis not present

## 2022-05-09 DIAGNOSIS — I499 Cardiac arrhythmia, unspecified: Secondary | ICD-10-CM | POA: Diagnosis not present

## 2022-05-09 DIAGNOSIS — R918 Other nonspecific abnormal finding of lung field: Secondary | ICD-10-CM | POA: Diagnosis not present

## 2022-05-09 DIAGNOSIS — Z79899 Other long term (current) drug therapy: Secondary | ICD-10-CM | POA: Diagnosis not present

## 2022-05-09 DIAGNOSIS — E039 Hypothyroidism, unspecified: Secondary | ICD-10-CM | POA: Diagnosis not present

## 2022-05-09 DIAGNOSIS — Z885 Allergy status to narcotic agent status: Secondary | ICD-10-CM | POA: Diagnosis not present

## 2022-05-09 DIAGNOSIS — I4891 Unspecified atrial fibrillation: Secondary | ICD-10-CM | POA: Diagnosis not present

## 2022-05-09 DIAGNOSIS — I214 Non-ST elevation (NSTEMI) myocardial infarction: Secondary | ICD-10-CM | POA: Diagnosis not present

## 2022-05-09 DIAGNOSIS — Z825 Family history of asthma and other chronic lower respiratory diseases: Secondary | ICD-10-CM | POA: Diagnosis not present

## 2022-05-09 DIAGNOSIS — R6889 Other general symptoms and signs: Secondary | ICD-10-CM | POA: Diagnosis not present

## 2022-05-09 DIAGNOSIS — F17210 Nicotine dependence, cigarettes, uncomplicated: Secondary | ICD-10-CM | POA: Diagnosis not present

## 2022-05-09 DIAGNOSIS — Z743 Need for continuous supervision: Secondary | ICD-10-CM | POA: Diagnosis not present

## 2022-05-09 DIAGNOSIS — R531 Weakness: Secondary | ICD-10-CM | POA: Diagnosis not present

## 2022-05-10 DIAGNOSIS — Z881 Allergy status to other antibiotic agents status: Secondary | ICD-10-CM | POA: Diagnosis not present

## 2022-05-10 DIAGNOSIS — D500 Iron deficiency anemia secondary to blood loss (chronic): Secondary | ICD-10-CM | POA: Diagnosis not present

## 2022-05-10 DIAGNOSIS — Z6822 Body mass index (BMI) 22.0-22.9, adult: Secondary | ICD-10-CM | POA: Diagnosis not present

## 2022-05-10 DIAGNOSIS — D6489 Other specified anemias: Secondary | ICD-10-CM | POA: Diagnosis not present

## 2022-05-10 DIAGNOSIS — Z8673 Personal history of transient ischemic attack (TIA), and cerebral infarction without residual deficits: Secondary | ICD-10-CM | POA: Diagnosis not present

## 2022-05-10 DIAGNOSIS — R410 Disorientation, unspecified: Secondary | ICD-10-CM | POA: Diagnosis not present

## 2022-05-10 DIAGNOSIS — I10 Essential (primary) hypertension: Secondary | ICD-10-CM | POA: Diagnosis not present

## 2022-05-10 DIAGNOSIS — Z9842 Cataract extraction status, left eye: Secondary | ICD-10-CM | POA: Diagnosis not present

## 2022-05-10 DIAGNOSIS — K209 Esophagitis, unspecified without bleeding: Secondary | ICD-10-CM | POA: Diagnosis not present

## 2022-05-10 DIAGNOSIS — Z6823 Body mass index (BMI) 23.0-23.9, adult: Secondary | ICD-10-CM | POA: Diagnosis not present

## 2022-05-10 DIAGNOSIS — Z885 Allergy status to narcotic agent status: Secondary | ICD-10-CM | POA: Diagnosis not present

## 2022-05-10 DIAGNOSIS — D6869 Other thrombophilia: Secondary | ICD-10-CM | POA: Diagnosis not present

## 2022-05-10 DIAGNOSIS — Z9841 Cataract extraction status, right eye: Secondary | ICD-10-CM | POA: Diagnosis not present

## 2022-05-10 DIAGNOSIS — I214 Non-ST elevation (NSTEMI) myocardial infarction: Secondary | ICD-10-CM | POA: Diagnosis not present

## 2022-05-10 DIAGNOSIS — I493 Ventricular premature depolarization: Secondary | ICD-10-CM | POA: Diagnosis not present

## 2022-05-10 DIAGNOSIS — D509 Iron deficiency anemia, unspecified: Secondary | ICD-10-CM | POA: Diagnosis not present

## 2022-05-10 DIAGNOSIS — I2510 Atherosclerotic heart disease of native coronary artery without angina pectoris: Secondary | ICD-10-CM | POA: Diagnosis not present

## 2022-05-10 DIAGNOSIS — E162 Hypoglycemia, unspecified: Secondary | ICD-10-CM | POA: Diagnosis not present

## 2022-05-10 DIAGNOSIS — D649 Anemia, unspecified: Secondary | ICD-10-CM | POA: Diagnosis not present

## 2022-05-10 DIAGNOSIS — R001 Bradycardia, unspecified: Secondary | ICD-10-CM | POA: Diagnosis not present

## 2022-05-10 DIAGNOSIS — G4730 Sleep apnea, unspecified: Secondary | ICD-10-CM | POA: Diagnosis not present

## 2022-05-10 DIAGNOSIS — E039 Hypothyroidism, unspecified: Secondary | ICD-10-CM | POA: Diagnosis not present

## 2022-05-10 DIAGNOSIS — Z79899 Other long term (current) drug therapy: Secondary | ICD-10-CM | POA: Diagnosis not present

## 2022-05-10 DIAGNOSIS — R778 Other specified abnormalities of plasma proteins: Secondary | ICD-10-CM | POA: Diagnosis not present

## 2022-05-10 DIAGNOSIS — F17210 Nicotine dependence, cigarettes, uncomplicated: Secondary | ICD-10-CM | POA: Diagnosis not present

## 2022-05-10 DIAGNOSIS — Z888 Allergy status to other drugs, medicaments and biological substances status: Secondary | ICD-10-CM | POA: Diagnosis not present

## 2022-05-10 DIAGNOSIS — I248 Other forms of acute ischemic heart disease: Secondary | ICD-10-CM | POA: Diagnosis not present

## 2022-05-10 DIAGNOSIS — I21A1 Myocardial infarction type 2: Secondary | ICD-10-CM | POA: Diagnosis not present

## 2022-05-10 DIAGNOSIS — I4891 Unspecified atrial fibrillation: Secondary | ICD-10-CM | POA: Diagnosis not present

## 2022-05-11 DIAGNOSIS — F419 Anxiety disorder, unspecified: Secondary | ICD-10-CM

## 2022-05-11 DIAGNOSIS — F05 Delirium due to known physiological condition: Secondary | ICD-10-CM

## 2022-05-11 DIAGNOSIS — F1320 Sedative, hypnotic or anxiolytic dependence, uncomplicated: Secondary | ICD-10-CM

## 2022-05-11 HISTORY — DX: Anxiety disorder, unspecified: F41.9

## 2022-05-11 HISTORY — DX: Delirium due to known physiological condition: F05

## 2022-05-11 HISTORY — DX: Sedative, hypnotic or anxiolytic dependence, uncomplicated: F13.20

## 2022-06-14 DIAGNOSIS — R002 Palpitations: Secondary | ICD-10-CM | POA: Diagnosis not present

## 2022-06-14 DIAGNOSIS — Z6821 Body mass index (BMI) 21.0-21.9, adult: Secondary | ICD-10-CM | POA: Diagnosis not present

## 2022-06-14 DIAGNOSIS — R110 Nausea: Secondary | ICD-10-CM | POA: Diagnosis not present

## 2022-07-11 DIAGNOSIS — R002 Palpitations: Secondary | ICD-10-CM | POA: Diagnosis not present

## 2022-07-31 ENCOUNTER — Other Ambulatory Visit: Payer: Self-pay

## 2022-07-31 DIAGNOSIS — Z8709 Personal history of other diseases of the respiratory system: Secondary | ICD-10-CM | POA: Insufficient documentation

## 2022-07-31 DIAGNOSIS — M722 Plantar fascial fibromatosis: Secondary | ICD-10-CM | POA: Insufficient documentation

## 2022-07-31 DIAGNOSIS — G4700 Insomnia, unspecified: Secondary | ICD-10-CM | POA: Insufficient documentation

## 2022-07-31 DIAGNOSIS — L719 Rosacea, unspecified: Secondary | ICD-10-CM | POA: Insufficient documentation

## 2022-07-31 DIAGNOSIS — J449 Chronic obstructive pulmonary disease, unspecified: Secondary | ICD-10-CM | POA: Insufficient documentation

## 2022-07-31 DIAGNOSIS — J441 Chronic obstructive pulmonary disease with (acute) exacerbation: Secondary | ICD-10-CM | POA: Insufficient documentation

## 2022-07-31 DIAGNOSIS — I10 Essential (primary) hypertension: Secondary | ICD-10-CM | POA: Insufficient documentation

## 2022-07-31 DIAGNOSIS — K635 Polyp of colon: Secondary | ICD-10-CM | POA: Insufficient documentation

## 2022-07-31 DIAGNOSIS — M109 Gout, unspecified: Secondary | ICD-10-CM | POA: Insufficient documentation

## 2022-07-31 DIAGNOSIS — G459 Transient cerebral ischemic attack, unspecified: Secondary | ICD-10-CM | POA: Insufficient documentation

## 2022-07-31 DIAGNOSIS — R002 Palpitations: Secondary | ICD-10-CM | POA: Insufficient documentation

## 2022-07-31 DIAGNOSIS — M810 Age-related osteoporosis without current pathological fracture: Secondary | ICD-10-CM | POA: Insufficient documentation

## 2022-07-31 DIAGNOSIS — G8929 Other chronic pain: Secondary | ICD-10-CM | POA: Insufficient documentation

## 2022-07-31 DIAGNOSIS — M7552 Bursitis of left shoulder: Secondary | ICD-10-CM | POA: Insufficient documentation

## 2022-07-31 DIAGNOSIS — Z6821 Body mass index (BMI) 21.0-21.9, adult: Secondary | ICD-10-CM | POA: Insufficient documentation

## 2022-07-31 DIAGNOSIS — I471 Supraventricular tachycardia: Secondary | ICD-10-CM | POA: Insufficient documentation

## 2022-07-31 DIAGNOSIS — K219 Gastro-esophageal reflux disease without esophagitis: Secondary | ICD-10-CM | POA: Insufficient documentation

## 2022-07-31 DIAGNOSIS — D369 Benign neoplasm, unspecified site: Secondary | ICD-10-CM | POA: Insufficient documentation

## 2022-07-31 DIAGNOSIS — R42 Dizziness and giddiness: Secondary | ICD-10-CM | POA: Insufficient documentation

## 2022-07-31 DIAGNOSIS — M1990 Unspecified osteoarthritis, unspecified site: Secondary | ICD-10-CM | POA: Insufficient documentation

## 2022-07-31 DIAGNOSIS — N809 Endometriosis, unspecified: Secondary | ICD-10-CM | POA: Insufficient documentation

## 2022-07-31 DIAGNOSIS — F988 Other specified behavioral and emotional disorders with onset usually occurring in childhood and adolescence: Secondary | ICD-10-CM | POA: Insufficient documentation

## 2022-07-31 DIAGNOSIS — I509 Heart failure, unspecified: Secondary | ICD-10-CM | POA: Insufficient documentation

## 2022-08-08 ENCOUNTER — Encounter: Payer: Self-pay | Admitting: Cardiology

## 2022-08-08 ENCOUNTER — Ambulatory Visit: Payer: Medicare Other | Attending: Cardiology | Admitting: Cardiology

## 2022-08-08 VITALS — BP 180/76 | HR 55 | Ht 62.0 in | Wt 123.2 lb

## 2022-08-08 DIAGNOSIS — E782 Mixed hyperlipidemia: Secondary | ICD-10-CM

## 2022-08-08 DIAGNOSIS — I10 Essential (primary) hypertension: Secondary | ICD-10-CM

## 2022-08-08 DIAGNOSIS — I480 Paroxysmal atrial fibrillation: Secondary | ICD-10-CM | POA: Diagnosis not present
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
## 2022-08-08 NOTE — Patient Instructions (Signed)

## 2022-09-19 DIAGNOSIS — R300 Dysuria: Secondary | ICD-10-CM | POA: Diagnosis not present

## 2022-09-19 DIAGNOSIS — Z6822 Body mass index (BMI) 22.0-22.9, adult: Secondary | ICD-10-CM | POA: Diagnosis not present

## 2022-09-20 DIAGNOSIS — R404 Transient alteration of awareness: Secondary | ICD-10-CM | POA: Diagnosis not present

## 2022-09-20 DIAGNOSIS — S0990XA Unspecified injury of head, initial encounter: Secondary | ICD-10-CM | POA: Diagnosis not present

## 2022-09-20 DIAGNOSIS — Z743 Need for continuous supervision: Secondary | ICD-10-CM | POA: Diagnosis not present

## 2022-09-20 DIAGNOSIS — S42291A Other displaced fracture of upper end of right humerus, initial encounter for closed fracture: Secondary | ICD-10-CM | POA: Diagnosis not present

## 2022-09-20 DIAGNOSIS — G319 Degenerative disease of nervous system, unspecified: Secondary | ICD-10-CM | POA: Diagnosis not present

## 2022-09-20 DIAGNOSIS — J441 Chronic obstructive pulmonary disease with (acute) exacerbation: Secondary | ICD-10-CM | POA: Diagnosis not present

## 2022-09-20 DIAGNOSIS — R9431 Abnormal electrocardiogram [ECG] [EKG]: Secondary | ICD-10-CM | POA: Diagnosis not present

## 2022-09-20 DIAGNOSIS — M79603 Pain in arm, unspecified: Secondary | ICD-10-CM | POA: Diagnosis not present

## 2022-09-20 DIAGNOSIS — S42211A Unspecified displaced fracture of surgical neck of right humerus, initial encounter for closed fracture: Secondary | ICD-10-CM | POA: Diagnosis not present

## 2022-09-20 DIAGNOSIS — R6889 Other general symptoms and signs: Secondary | ICD-10-CM | POA: Diagnosis not present

## 2022-09-20 DIAGNOSIS — R296 Repeated falls: Secondary | ICD-10-CM | POA: Diagnosis not present

## 2022-09-20 DIAGNOSIS — M79601 Pain in right arm: Secondary | ICD-10-CM | POA: Diagnosis not present

## 2022-09-20 DIAGNOSIS — Z043 Encounter for examination and observation following other accident: Secondary | ICD-10-CM | POA: Diagnosis not present

## 2022-09-20 DIAGNOSIS — K219 Gastro-esophageal reflux disease without esophagitis: Secondary | ICD-10-CM | POA: Diagnosis not present

## 2022-09-20 DIAGNOSIS — W19XXXA Unspecified fall, initial encounter: Secondary | ICD-10-CM | POA: Diagnosis not present

## 2022-09-20 DIAGNOSIS — M25511 Pain in right shoulder: Secondary | ICD-10-CM | POA: Diagnosis not present

## 2022-09-21 DIAGNOSIS — E785 Hyperlipidemia, unspecified: Secondary | ICD-10-CM | POA: Diagnosis not present

## 2022-09-21 DIAGNOSIS — R296 Repeated falls: Secondary | ICD-10-CM | POA: Diagnosis not present

## 2022-09-21 DIAGNOSIS — M79601 Pain in right arm: Secondary | ICD-10-CM | POA: Diagnosis not present

## 2022-09-21 DIAGNOSIS — E538 Deficiency of other specified B group vitamins: Secondary | ICD-10-CM | POA: Diagnosis not present

## 2022-09-21 DIAGNOSIS — F17210 Nicotine dependence, cigarettes, uncomplicated: Secondary | ICD-10-CM | POA: Diagnosis not present

## 2022-09-21 DIAGNOSIS — K219 Gastro-esophageal reflux disease without esophagitis: Secondary | ICD-10-CM | POA: Diagnosis not present

## 2022-09-21 DIAGNOSIS — Z8673 Personal history of transient ischemic attack (TIA), and cerebral infarction without residual deficits: Secondary | ICD-10-CM | POA: Diagnosis not present

## 2022-09-21 DIAGNOSIS — M1990 Unspecified osteoarthritis, unspecified site: Secondary | ICD-10-CM | POA: Diagnosis not present

## 2022-09-21 DIAGNOSIS — I951 Orthostatic hypotension: Secondary | ICD-10-CM | POA: Diagnosis not present

## 2022-09-21 DIAGNOSIS — S0990XA Unspecified injury of head, initial encounter: Secondary | ICD-10-CM | POA: Diagnosis not present

## 2022-09-21 DIAGNOSIS — J441 Chronic obstructive pulmonary disease with (acute) exacerbation: Secondary | ICD-10-CM | POA: Diagnosis not present

## 2022-09-21 DIAGNOSIS — N189 Chronic kidney disease, unspecified: Secondary | ICD-10-CM | POA: Diagnosis not present

## 2022-09-21 DIAGNOSIS — G319 Degenerative disease of nervous system, unspecified: Secondary | ICD-10-CM | POA: Diagnosis not present

## 2022-09-21 DIAGNOSIS — R5383 Other fatigue: Secondary | ICD-10-CM | POA: Diagnosis not present

## 2022-09-21 DIAGNOSIS — S42291A Other displaced fracture of upper end of right humerus, initial encounter for closed fracture: Secondary | ICD-10-CM | POA: Diagnosis not present

## 2022-09-21 DIAGNOSIS — M797 Fibromyalgia: Secondary | ICD-10-CM | POA: Diagnosis not present

## 2022-09-21 DIAGNOSIS — S42201A Unspecified fracture of upper end of right humerus, initial encounter for closed fracture: Secondary | ICD-10-CM | POA: Diagnosis not present

## 2022-09-21 DIAGNOSIS — R9431 Abnormal electrocardiogram [ECG] [EKG]: Secondary | ICD-10-CM | POA: Diagnosis not present

## 2022-09-21 DIAGNOSIS — S42211A Unspecified displaced fracture of surgical neck of right humerus, initial encounter for closed fracture: Secondary | ICD-10-CM | POA: Diagnosis not present

## 2022-09-21 DIAGNOSIS — Z043 Encounter for examination and observation following other accident: Secondary | ICD-10-CM | POA: Diagnosis not present

## 2022-09-21 DIAGNOSIS — E039 Hypothyroidism, unspecified: Secondary | ICD-10-CM | POA: Diagnosis not present

## 2022-09-21 DIAGNOSIS — M109 Gout, unspecified: Secondary | ICD-10-CM | POA: Diagnosis not present

## 2022-09-21 DIAGNOSIS — D509 Iron deficiency anemia, unspecified: Secondary | ICD-10-CM | POA: Diagnosis not present

## 2022-09-21 DIAGNOSIS — M25511 Pain in right shoulder: Secondary | ICD-10-CM | POA: Diagnosis not present

## 2022-09-21 DIAGNOSIS — S42301A Unspecified fracture of shaft of humerus, right arm, initial encounter for closed fracture: Secondary | ICD-10-CM | POA: Diagnosis not present

## 2022-09-21 DIAGNOSIS — Z87440 Personal history of urinary (tract) infections: Secondary | ICD-10-CM | POA: Diagnosis not present

## 2022-09-21 DIAGNOSIS — I129 Hypertensive chronic kidney disease with stage 1 through stage 4 chronic kidney disease, or unspecified chronic kidney disease: Secondary | ICD-10-CM | POA: Diagnosis not present

## 2022-09-21 DIAGNOSIS — Z888 Allergy status to other drugs, medicaments and biological substances status: Secondary | ICD-10-CM | POA: Diagnosis not present

## 2022-09-21 DIAGNOSIS — W19XXXA Unspecified fall, initial encounter: Secondary | ICD-10-CM | POA: Diagnosis not present

## 2022-09-21 DIAGNOSIS — Z79899 Other long term (current) drug therapy: Secondary | ICD-10-CM | POA: Diagnosis not present

## 2022-09-21 DIAGNOSIS — F32A Depression, unspecified: Secondary | ICD-10-CM | POA: Diagnosis not present

## 2022-09-24 DIAGNOSIS — M797 Fibromyalgia: Secondary | ICD-10-CM | POA: Diagnosis not present

## 2022-09-24 DIAGNOSIS — R5383 Other fatigue: Secondary | ICD-10-CM | POA: Diagnosis not present

## 2022-09-24 DIAGNOSIS — E785 Hyperlipidemia, unspecified: Secondary | ICD-10-CM | POA: Diagnosis not present

## 2022-10-26 DIAGNOSIS — S42301A Unspecified fracture of shaft of humerus, right arm, initial encounter for closed fracture: Secondary | ICD-10-CM | POA: Diagnosis not present

## 2022-10-26 DIAGNOSIS — S42201S Unspecified fracture of upper end of right humerus, sequela: Secondary | ICD-10-CM | POA: Diagnosis not present

## 2022-10-26 DIAGNOSIS — M79601 Pain in right arm: Secondary | ICD-10-CM | POA: Diagnosis not present

## 2022-10-26 DIAGNOSIS — R42 Dizziness and giddiness: Secondary | ICD-10-CM | POA: Diagnosis not present

## 2022-10-26 DIAGNOSIS — G8911 Acute pain due to trauma: Secondary | ICD-10-CM | POA: Diagnosis not present

## 2022-10-26 DIAGNOSIS — S42201A Unspecified fracture of upper end of right humerus, initial encounter for closed fracture: Secondary | ICD-10-CM | POA: Diagnosis not present

## 2022-10-26 DIAGNOSIS — R55 Syncope and collapse: Secondary | ICD-10-CM | POA: Diagnosis not present

## 2022-11-11 DIAGNOSIS — R410 Disorientation, unspecified: Secondary | ICD-10-CM | POA: Diagnosis not present

## 2022-11-11 DIAGNOSIS — K658 Other peritonitis: Secondary | ICD-10-CM | POA: Diagnosis not present

## 2022-11-11 DIAGNOSIS — I10 Essential (primary) hypertension: Secondary | ICD-10-CM | POA: Diagnosis not present

## 2022-11-11 DIAGNOSIS — K219 Gastro-esophageal reflux disease without esophagitis: Secondary | ICD-10-CM | POA: Diagnosis not present

## 2022-11-19 DIAGNOSIS — Z79899 Other long term (current) drug therapy: Secondary | ICD-10-CM | POA: Diagnosis not present

## 2022-11-19 DIAGNOSIS — I700 Atherosclerosis of aorta: Secondary | ICD-10-CM | POA: Diagnosis not present

## 2022-11-19 DIAGNOSIS — I639 Cerebral infarction, unspecified: Secondary | ICD-10-CM | POA: Diagnosis not present

## 2022-11-19 DIAGNOSIS — R918 Other nonspecific abnormal finding of lung field: Secondary | ICD-10-CM | POA: Diagnosis not present

## 2022-11-19 DIAGNOSIS — R531 Weakness: Secondary | ICD-10-CM | POA: Diagnosis not present

## 2022-11-19 DIAGNOSIS — R296 Repeated falls: Secondary | ICD-10-CM | POA: Diagnosis not present

## 2022-11-19 DIAGNOSIS — Z139 Encounter for screening, unspecified: Secondary | ICD-10-CM | POA: Diagnosis not present

## 2022-11-21 DIAGNOSIS — R531 Weakness: Secondary | ICD-10-CM | POA: Diagnosis not present

## 2022-11-21 DIAGNOSIS — Z7401 Bed confinement status: Secondary | ICD-10-CM | POA: Diagnosis not present

## 2022-11-21 DIAGNOSIS — Z743 Need for continuous supervision: Secondary | ICD-10-CM | POA: Diagnosis not present

## 2023-01-11 DIAGNOSIS — K2900 Acute gastritis without bleeding: Secondary | ICD-10-CM | POA: Diagnosis not present

## 2023-01-11 DIAGNOSIS — I10 Essential (primary) hypertension: Secondary | ICD-10-CM | POA: Diagnosis not present

## 2023-01-11 DIAGNOSIS — I701 Atherosclerosis of renal artery: Secondary | ICD-10-CM | POA: Diagnosis not present

## 2023-01-11 DIAGNOSIS — R112 Nausea with vomiting, unspecified: Secondary | ICD-10-CM | POA: Diagnosis not present

## 2023-01-11 DIAGNOSIS — I160 Hypertensive urgency: Secondary | ICD-10-CM | POA: Diagnosis not present

## 2023-01-11 DIAGNOSIS — K449 Diaphragmatic hernia without obstruction or gangrene: Secondary | ICD-10-CM | POA: Diagnosis not present

## 2023-01-11 DIAGNOSIS — R0902 Hypoxemia: Secondary | ICD-10-CM | POA: Diagnosis not present

## 2023-01-11 DIAGNOSIS — R1111 Vomiting without nausea: Secondary | ICD-10-CM | POA: Diagnosis not present

## 2023-01-11 DIAGNOSIS — R531 Weakness: Secondary | ICD-10-CM | POA: Diagnosis not present

## 2023-01-11 DIAGNOSIS — R9431 Abnormal electrocardiogram [ECG] [EKG]: Secondary | ICD-10-CM | POA: Diagnosis not present

## 2023-01-11 DIAGNOSIS — N200 Calculus of kidney: Secondary | ICD-10-CM | POA: Diagnosis not present

## 2023-01-11 DIAGNOSIS — R58 Hemorrhage, not elsewhere classified: Secondary | ICD-10-CM | POA: Diagnosis not present

## 2023-01-11 DIAGNOSIS — R109 Unspecified abdominal pain: Secondary | ICD-10-CM | POA: Diagnosis not present

## 2023-01-11 DIAGNOSIS — Z1152 Encounter for screening for COVID-19: Secondary | ICD-10-CM | POA: Diagnosis not present

## 2023-01-11 DIAGNOSIS — R1110 Vomiting, unspecified: Secondary | ICD-10-CM | POA: Diagnosis not present

## 2023-01-23 DIAGNOSIS — Z79899 Other long term (current) drug therapy: Secondary | ICD-10-CM | POA: Diagnosis not present

## 2023-01-23 DIAGNOSIS — R1030 Lower abdominal pain, unspecified: Secondary | ICD-10-CM | POA: Diagnosis not present

## 2023-01-23 DIAGNOSIS — Z743 Need for continuous supervision: Secondary | ICD-10-CM | POA: Diagnosis not present

## 2023-01-23 DIAGNOSIS — R6889 Other general symptoms and signs: Secondary | ICD-10-CM | POA: Diagnosis not present

## 2023-01-23 DIAGNOSIS — K219 Gastro-esophageal reflux disease without esophagitis: Secondary | ICD-10-CM | POA: Diagnosis not present

## 2023-01-23 DIAGNOSIS — N390 Urinary tract infection, site not specified: Secondary | ICD-10-CM | POA: Diagnosis not present

## 2023-01-23 DIAGNOSIS — J449 Chronic obstructive pulmonary disease, unspecified: Secondary | ICD-10-CM | POA: Diagnosis not present

## 2023-02-05 DIAGNOSIS — R42 Dizziness and giddiness: Secondary | ICD-10-CM | POA: Diagnosis not present

## 2023-02-05 DIAGNOSIS — R413 Other amnesia: Secondary | ICD-10-CM | POA: Diagnosis not present

## 2023-02-05 DIAGNOSIS — E039 Hypothyroidism, unspecified: Secondary | ICD-10-CM | POA: Diagnosis not present

## 2023-02-05 DIAGNOSIS — S0990XA Unspecified injury of head, initial encounter: Secondary | ICD-10-CM | POA: Diagnosis not present

## 2023-02-05 DIAGNOSIS — R531 Weakness: Secondary | ICD-10-CM | POA: Diagnosis not present

## 2023-02-05 DIAGNOSIS — R296 Repeated falls: Secondary | ICD-10-CM | POA: Diagnosis not present

## 2023-02-05 DIAGNOSIS — Z681 Body mass index (BMI) 19 or less, adult: Secondary | ICD-10-CM | POA: Diagnosis not present

## 2023-02-11 ENCOUNTER — Encounter: Payer: Self-pay | Admitting: Physician Assistant

## 2023-02-25 DIAGNOSIS — S42201K Unspecified fracture of upper end of right humerus, subsequent encounter for fracture with nonunion: Secondary | ICD-10-CM | POA: Diagnosis not present

## 2023-03-03 DIAGNOSIS — S42201K Unspecified fracture of upper end of right humerus, subsequent encounter for fracture with nonunion: Secondary | ICD-10-CM | POA: Diagnosis not present

## 2023-03-03 DIAGNOSIS — M79604 Pain in right leg: Secondary | ICD-10-CM | POA: Diagnosis not present

## 2023-03-05 DIAGNOSIS — M79604 Pain in right leg: Secondary | ICD-10-CM | POA: Diagnosis not present

## 2023-03-05 DIAGNOSIS — R2689 Other abnormalities of gait and mobility: Secondary | ICD-10-CM | POA: Diagnosis not present

## 2023-03-12 ENCOUNTER — Ambulatory Visit: Payer: Medicare Other | Admitting: Physician Assistant

## 2023-03-12 ENCOUNTER — Encounter: Payer: Self-pay | Admitting: Physician Assistant

## 2023-03-12 VITALS — BP 172/62 | HR 62 | Resp 18 | Ht 62.0 in | Wt 102.0 lb

## 2023-03-12 DIAGNOSIS — F988 Other specified behavioral and emotional disorders with onset usually occurring in childhood and adolescence: Secondary | ICD-10-CM | POA: Diagnosis not present

## 2023-03-12 DIAGNOSIS — F329 Major depressive disorder, single episode, unspecified: Secondary | ICD-10-CM

## 2023-03-12 DIAGNOSIS — M79604 Pain in right leg: Secondary | ICD-10-CM | POA: Diagnosis not present

## 2023-03-12 DIAGNOSIS — R2689 Other abnormalities of gait and mobility: Secondary | ICD-10-CM | POA: Diagnosis not present

## 2023-03-12 DIAGNOSIS — F322 Major depressive disorder, single episode, severe without psychotic features: Secondary | ICD-10-CM | POA: Insufficient documentation

## 2023-03-12 DIAGNOSIS — R413 Other amnesia: Secondary | ICD-10-CM | POA: Diagnosis not present
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
## 2023-03-12 NOTE — Patient Instructions (Addendum)

## 2023-03-17 DIAGNOSIS — M25551 Pain in right hip: Secondary | ICD-10-CM | POA: Diagnosis not present

## 2023-03-17 DIAGNOSIS — R300 Dysuria: Secondary | ICD-10-CM | POA: Diagnosis not present

## 2023-03-17 DIAGNOSIS — R7989 Other specified abnormal findings of blood chemistry: Secondary | ICD-10-CM | POA: Diagnosis not present

## 2023-03-17 DIAGNOSIS — M545 Low back pain, unspecified: Secondary | ICD-10-CM | POA: Diagnosis not present

## 2023-03-17 DIAGNOSIS — Z681 Body mass index (BMI) 19 or less, adult: Secondary | ICD-10-CM | POA: Diagnosis not present

## 2023-03-17 DIAGNOSIS — M25561 Pain in right knee: Secondary | ICD-10-CM | POA: Diagnosis not present

## 2023-03-20 DIAGNOSIS — Z681 Body mass index (BMI) 19 or less, adult: Secondary | ICD-10-CM | POA: Diagnosis not present

## 2023-03-20 DIAGNOSIS — L989 Disorder of the skin and subcutaneous tissue, unspecified: Secondary | ICD-10-CM | POA: Diagnosis not present

## 2023-04-09 ENCOUNTER — Other Ambulatory Visit: Payer: Self-pay

## 2023-04-09 ENCOUNTER — Emergency Department (HOSPITAL_COMMUNITY): Payer: Medicare Other

## 2023-04-09 ENCOUNTER — Encounter (HOSPITAL_COMMUNITY): Payer: Self-pay

## 2023-04-09 ENCOUNTER — Inpatient Hospital Stay (HOSPITAL_COMMUNITY)
Admission: EM | Admit: 2023-04-09 | Discharge: 2023-04-15 | DRG: 871 | Disposition: A | Discharge status: Skilled Nursing Facility | Payer: Medicare Other | Attending: Internal Medicine | Admitting: Internal Medicine

## 2023-04-09 DIAGNOSIS — E785 Hyperlipidemia, unspecified: Secondary | ICD-10-CM | POA: Diagnosis not present

## 2023-04-09 DIAGNOSIS — N179 Acute kidney failure, unspecified: Secondary | ICD-10-CM

## 2023-04-09 DIAGNOSIS — A419 Sepsis, unspecified organism: Secondary | ICD-10-CM | POA: Diagnosis not present

## 2023-04-09 DIAGNOSIS — Z1152 Encounter for screening for COVID-19: Secondary | ICD-10-CM | POA: Diagnosis not present

## 2023-04-09 DIAGNOSIS — E871 Hypo-osmolality and hyponatremia: Secondary | ICD-10-CM | POA: Diagnosis present

## 2023-04-09 DIAGNOSIS — N1831 Chronic kidney disease, stage 3a: Secondary | ICD-10-CM | POA: Diagnosis present

## 2023-04-09 DIAGNOSIS — Z9682 Presence of neurostimulator: Secondary | ICD-10-CM

## 2023-04-09 DIAGNOSIS — Z743 Need for continuous supervision: Secondary | ICD-10-CM | POA: Diagnosis not present

## 2023-04-09 DIAGNOSIS — R079 Chest pain, unspecified: Secondary | ICD-10-CM | POA: Diagnosis not present

## 2023-04-09 DIAGNOSIS — J9601 Acute respiratory failure with hypoxia: Secondary | ICD-10-CM | POA: Diagnosis present

## 2023-04-09 DIAGNOSIS — D649 Anemia, unspecified: Secondary | ICD-10-CM | POA: Diagnosis not present

## 2023-04-09 DIAGNOSIS — Z87891 Personal history of nicotine dependence: Secondary | ICD-10-CM

## 2023-04-09 DIAGNOSIS — Z79899 Other long term (current) drug therapy: Secondary | ICD-10-CM

## 2023-04-09 DIAGNOSIS — E43 Unspecified severe protein-calorie malnutrition: Secondary | ICD-10-CM | POA: Diagnosis not present

## 2023-04-09 DIAGNOSIS — F988 Other specified behavioral and emotional disorders with onset usually occurring in childhood and adolescence: Secondary | ICD-10-CM | POA: Diagnosis present

## 2023-04-09 DIAGNOSIS — J189 Pneumonia, unspecified organism: Secondary | ICD-10-CM | POA: Diagnosis not present

## 2023-04-09 DIAGNOSIS — J449 Chronic obstructive pulmonary disease, unspecified: Secondary | ICD-10-CM | POA: Diagnosis present

## 2023-04-09 DIAGNOSIS — M545 Low back pain, unspecified: Secondary | ICD-10-CM | POA: Diagnosis not present

## 2023-04-09 DIAGNOSIS — E538 Deficiency of other specified B group vitamins: Secondary | ICD-10-CM | POA: Diagnosis present

## 2023-04-09 DIAGNOSIS — G894 Chronic pain syndrome: Secondary | ICD-10-CM | POA: Diagnosis not present

## 2023-04-09 DIAGNOSIS — R4181 Age-related cognitive decline: Secondary | ICD-10-CM | POA: Diagnosis present

## 2023-04-09 DIAGNOSIS — R2681 Unsteadiness on feet: Secondary | ICD-10-CM | POA: Diagnosis not present

## 2023-04-09 DIAGNOSIS — N3281 Overactive bladder: Secondary | ICD-10-CM | POA: Diagnosis present

## 2023-04-09 DIAGNOSIS — M797 Fibromyalgia: Secondary | ICD-10-CM | POA: Diagnosis not present

## 2023-04-09 DIAGNOSIS — I10 Essential (primary) hypertension: Secondary | ICD-10-CM | POA: Diagnosis present

## 2023-04-09 DIAGNOSIS — E861 Hypovolemia: Secondary | ICD-10-CM | POA: Diagnosis present

## 2023-04-09 DIAGNOSIS — Z888 Allergy status to other drugs, medicaments and biological substances status: Secondary | ICD-10-CM

## 2023-04-09 DIAGNOSIS — J441 Chronic obstructive pulmonary disease with (acute) exacerbation: Secondary | ICD-10-CM | POA: Diagnosis not present

## 2023-04-09 DIAGNOSIS — Z7401 Bed confinement status: Secondary | ICD-10-CM | POA: Diagnosis not present

## 2023-04-09 DIAGNOSIS — Z886 Allergy status to analgesic agent status: Secondary | ICD-10-CM

## 2023-04-09 DIAGNOSIS — Z90710 Acquired absence of both cervix and uterus: Secondary | ICD-10-CM

## 2023-04-09 DIAGNOSIS — Z79890 Hormone replacement therapy (postmenopausal): Secondary | ICD-10-CM

## 2023-04-09 DIAGNOSIS — E860 Dehydration: Secondary | ICD-10-CM | POA: Diagnosis present

## 2023-04-09 DIAGNOSIS — E039 Hypothyroidism, unspecified: Secondary | ICD-10-CM | POA: Diagnosis present

## 2023-04-09 DIAGNOSIS — Z8673 Personal history of transient ischemic attack (TIA), and cerebral infarction without residual deficits: Secondary | ICD-10-CM

## 2023-04-09 DIAGNOSIS — E8889 Other specified metabolic disorders: Secondary | ICD-10-CM | POA: Diagnosis not present

## 2023-04-09 DIAGNOSIS — R0602 Shortness of breath: Secondary | ICD-10-CM | POA: Diagnosis not present

## 2023-04-09 DIAGNOSIS — I129 Hypertensive chronic kidney disease with stage 1 through stage 4 chronic kidney disease, or unspecified chronic kidney disease: Secondary | ICD-10-CM | POA: Diagnosis not present

## 2023-04-09 DIAGNOSIS — K219 Gastro-esophageal reflux disease without esophagitis: Secondary | ICD-10-CM | POA: Diagnosis not present

## 2023-04-09 DIAGNOSIS — D72829 Elevated white blood cell count, unspecified: Secondary | ICD-10-CM | POA: Diagnosis not present

## 2023-04-09 DIAGNOSIS — J440 Chronic obstructive pulmonary disease with acute lower respiratory infection: Secondary | ICD-10-CM | POA: Diagnosis not present

## 2023-04-09 DIAGNOSIS — R413 Other amnesia: Secondary | ICD-10-CM | POA: Diagnosis present

## 2023-04-09 DIAGNOSIS — N390 Urinary tract infection, site not specified: Secondary | ICD-10-CM | POA: Diagnosis present

## 2023-04-09 DIAGNOSIS — Z7982 Long term (current) use of aspirin: Secondary | ICD-10-CM

## 2023-04-09 DIAGNOSIS — D803 Selective deficiency of immunoglobulin G [IgG] subclasses: Secondary | ICD-10-CM | POA: Diagnosis not present

## 2023-04-09 DIAGNOSIS — J90 Pleural effusion, not elsewhere classified: Secondary | ICD-10-CM | POA: Diagnosis not present

## 2023-04-09 DIAGNOSIS — K5900 Constipation, unspecified: Secondary | ICD-10-CM | POA: Diagnosis not present

## 2023-04-09 DIAGNOSIS — G4733 Obstructive sleep apnea (adult) (pediatric): Secondary | ICD-10-CM | POA: Diagnosis not present

## 2023-04-09 DIAGNOSIS — Z881 Allergy status to other antibiotic agents status: Secondary | ICD-10-CM

## 2023-04-09 DIAGNOSIS — R531 Weakness: Secondary | ICD-10-CM | POA: Diagnosis not present

## 2023-04-09 DIAGNOSIS — E876 Hypokalemia: Secondary | ICD-10-CM | POA: Diagnosis not present

## 2023-04-09 DIAGNOSIS — M109 Gout, unspecified: Secondary | ICD-10-CM | POA: Diagnosis present

## 2023-04-09 DIAGNOSIS — Z87440 Personal history of urinary (tract) infections: Secondary | ICD-10-CM

## 2023-04-09 DIAGNOSIS — I480 Paroxysmal atrial fibrillation: Secondary | ICD-10-CM | POA: Diagnosis present

## 2023-04-09 DIAGNOSIS — M810 Age-related osteoporosis without current pathological fracture: Secondary | ICD-10-CM | POA: Diagnosis present

## 2023-04-09 DIAGNOSIS — Z825 Family history of asthma and other chronic lower respiratory diseases: Secondary | ICD-10-CM

## 2023-04-09 DIAGNOSIS — F32A Depression, unspecified: Secondary | ICD-10-CM | POA: Diagnosis present

## 2023-04-09 DIAGNOSIS — E559 Vitamin D deficiency, unspecified: Secondary | ICD-10-CM | POA: Diagnosis present

## 2023-04-09 DIAGNOSIS — Z961 Presence of intraocular lens: Secondary | ICD-10-CM | POA: Diagnosis present

## 2023-04-09 DIAGNOSIS — F419 Anxiety disorder, unspecified: Secondary | ICD-10-CM | POA: Diagnosis present

## 2023-04-09 LAB — HEPATIC FUNCTION PANEL
ALT: 28 U/L (ref 0–44)
AST: 58 U/L — ABNORMAL HIGH (ref 15–41)
Albumin: 2.7 g/dL — ABNORMAL LOW (ref 3.5–5.0)
Alkaline Phosphatase: 117 U/L (ref 38–126)
Bilirubin, Direct: 0.5 mg/dL — ABNORMAL HIGH (ref 0.0–0.2)
Indirect Bilirubin: 0.8 mg/dL (ref 0.3–0.9)
Total Bilirubin: 1.3 mg/dL — ABNORMAL HIGH (ref 0.3–1.2)
Total Protein: 7 g/dL (ref 6.5–8.1)

## 2023-04-09 LAB — I-STAT CHEM 8, ED
BUN: 52 mg/dL — ABNORMAL HIGH (ref 8–23)
Calcium, Ion: 0.83 mmol/L — CL (ref 1.15–1.40)
Chloride: 100 mmol/L (ref 98–111)
Creatinine, Ser: 1.4 mg/dL — ABNORMAL HIGH (ref 0.44–1.00)
Glucose, Bld: 107 mg/dL — ABNORMAL HIGH (ref 70–99)
HCT: 29 % — ABNORMAL LOW (ref 36.0–46.0)
Hemoglobin: 9.9 g/dL — ABNORMAL LOW (ref 12.0–15.0)
Potassium: 3.4 mmol/L — ABNORMAL LOW (ref 3.5–5.1)
Sodium: 130 mmol/L — ABNORMAL LOW (ref 135–145)
TCO2: 20 mmol/L — ABNORMAL LOW (ref 22–32)

## 2023-04-09 LAB — BASIC METABOLIC PANEL
Anion gap: 19 — ABNORMAL HIGH (ref 5–15)
BUN: 51 mg/dL — ABNORMAL HIGH (ref 8–23)
CO2: 20 mmol/L — ABNORMAL LOW (ref 22–32)
Calcium: 9 mg/dL (ref 8.9–10.3)
Chloride: 93 mmol/L — ABNORMAL LOW (ref 98–111)
Creatinine, Ser: 1.32 mg/dL — ABNORMAL HIGH (ref 0.44–1.00)
GFR, Estimated: 42 mL/min — ABNORMAL LOW (ref >60)
Glucose, Bld: 106 mg/dL — ABNORMAL HIGH (ref 70–99)
Potassium: 3.9 mmol/L (ref 3.5–5.1)
Sodium: 132 mmol/L — ABNORMAL LOW (ref 135–145)

## 2023-04-09 LAB — CBC
HCT: 34.3 % — ABNORMAL LOW (ref 36.0–46.0)
Hemoglobin: 10.9 g/dL — ABNORMAL LOW (ref 12.0–15.0)
MCH: 27 pg (ref 26.0–34.0)
MCHC: 31.8 g/dL (ref 30.0–36.0)
MCV: 85.1 fL (ref 80.0–100.0)
Platelets: 429 10*3/uL — ABNORMAL HIGH (ref 150–400)
RBC: 4.03 MIL/uL (ref 3.87–5.11)
RDW: 17 % — ABNORMAL HIGH (ref 11.5–15.5)
WBC: 21.9 10*3/uL — ABNORMAL HIGH (ref 4.0–10.5)
nRBC: 0 % (ref 0.0–0.2)

## 2023-04-09 LAB — SARS CORONAVIRUS 2 BY RT PCR: SARS Coronavirus 2 by RT PCR: NEGATIVE

## 2023-04-09 LAB — CBG MONITORING, ED: Glucose-Capillary: 99 mg/dL (ref 70–99)

## 2023-04-09 LAB — LIPASE, BLOOD: Lipase: 34 U/L (ref 11–51)

## 2023-04-09 LAB — PROCALCITONIN: Procalcitonin: 37.37 ng/mL

## 2023-04-09 MED ORDER — AZITHROMYCIN 500 MG PO TABS
500.0000 mg | ORAL_TABLET | Freq: Every day | ORAL | Status: AC
  Administered 2023-04-10 – 2023-04-12 (×3): 500 mg via ORAL
  Filled 2023-04-09 (×4): qty 1

## 2023-04-09 MED ORDER — ACETAMINOPHEN 325 MG PO TABS
650.0000 mg | ORAL_TABLET | Freq: Four times a day (QID) | ORAL | Status: DC | PRN
  Administered 2023-04-13: 650 mg via ORAL
  Filled 2023-04-09: qty 2

## 2023-04-09 MED ORDER — ASPIRIN 81 MG PO TBEC
81.0000 mg | DELAYED_RELEASE_TABLET | ORAL | Status: DC
  Administered 2023-04-09 – 2023-04-14 (×3): 81 mg via ORAL
  Filled 2023-04-09 (×3): qty 1

## 2023-04-09 MED ORDER — ALBUTEROL SULFATE (2.5 MG/3ML) 0.083% IN NEBU: 2.5000 mg | INHALATION_SOLUTION | RESPIRATORY_TRACT | Status: DC | PRN

## 2023-04-09 MED ORDER — DONEPEZIL HCL 5 MG PO TABS
5.0000 mg | ORAL_TABLET | Freq: Every day | ORAL | Status: DC
  Administered 2023-04-10 – 2023-04-15 (×6): 5 mg via ORAL
  Filled 2023-04-09 (×6): qty 1

## 2023-04-09 MED ORDER — SODIUM CHLORIDE 0.9 % IV SOLN
500.0000 mg | Freq: Once | INTRAVENOUS | Status: AC
  Administered 2023-04-09: 500 mg via INTRAVENOUS
  Filled 2023-04-09: qty 5

## 2023-04-09 MED ORDER — SODIUM CHLORIDE 0.9 % IV SOLN
1.0000 g | Freq: Once | INTRAVENOUS | Status: AC
  Administered 2023-04-09: 1 g via INTRAVENOUS
  Filled 2023-04-09: qty 10

## 2023-04-09 MED ORDER — SODIUM CHLORIDE 0.9 % IV SOLN
500.0000 mg | Freq: Once | INTRAVENOUS | Status: DC
  Filled 2023-04-09: qty 5

## 2023-04-09 MED ORDER — HYDRALAZINE HCL 25 MG PO TABS: 25.0000 mg | ORAL_TABLET | Freq: Four times a day (QID) | ORAL | Status: DC | PRN

## 2023-04-09 MED ORDER — OXYCODONE HCL 5 MG PO TABS
2.5000 mg | ORAL_TABLET | ORAL | Status: DC | PRN
  Administered 2023-04-09 – 2023-04-15 (×6): 5 mg via ORAL
  Filled 2023-04-09 (×6): qty 1

## 2023-04-09 MED ORDER — ONDANSETRON HCL 4 MG/2ML IJ SOLN: 4.0000 mg | Freq: Four times a day (QID) | INTRAMUSCULAR | Status: DC | PRN

## 2023-04-09 MED ORDER — HEPARIN SODIUM (PORCINE) 5000 UNIT/ML IJ SOLN
5000.0000 [IU] | Freq: Three times a day (TID) | INTRAMUSCULAR | Status: DC
  Administered 2023-04-09 – 2023-04-15 (×18): 5000 [IU] via SUBCUTANEOUS
  Filled 2023-04-09 (×17): qty 1

## 2023-04-09 MED ORDER — ALBUTEROL SULFATE HFA 108 (90 BASE) MCG/ACT IN AERS: 1.0000 | INHALATION_SPRAY | RESPIRATORY_TRACT | Status: DC | PRN

## 2023-04-09 MED ORDER — ROSUVASTATIN CALCIUM 5 MG PO TABS
10.0000 mg | ORAL_TABLET | Freq: Every day | ORAL | Status: DC
  Administered 2023-04-10 – 2023-04-15 (×6): 10 mg via ORAL
  Filled 2023-04-09 (×7): qty 2

## 2023-04-09 MED ORDER — SODIUM CHLORIDE 0.9 % IV BOLUS
1000.0000 mL | Freq: Once | INTRAVENOUS | Status: AC
  Administered 2023-04-09: 1000 mL via INTRAVENOUS

## 2023-04-09 MED ORDER — ONDANSETRON HCL 4 MG PO TABS: 4.0000 mg | ORAL_TABLET | Freq: Four times a day (QID) | ORAL | Status: DC | PRN

## 2023-04-09 MED ORDER — ACETAMINOPHEN 650 MG RE SUPP: 650.0000 mg | Freq: Four times a day (QID) | RECTAL | Status: DC | PRN

## 2023-04-09 MED ORDER — LEVOTHYROXINE SODIUM 75 MCG PO TABS
75.0000 ug | ORAL_TABLET | Freq: Every day | ORAL | Status: DC
  Administered 2023-04-10 – 2023-04-15 (×6): 75 ug via ORAL
  Filled 2023-04-09 (×6): qty 1

## 2023-04-09 MED ORDER — SODIUM CHLORIDE 0.9 % IV SOLN
2.0000 g | INTRAVENOUS | Status: AC
  Administered 2023-04-10 – 2023-04-13 (×4): 2 g via INTRAVENOUS
  Filled 2023-04-09 (×4): qty 20
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
## 2023-04-09 NOTE — ED Provider Notes (Signed)
[facility]GENC[facility] DEPARTMENT AT [facility] Provider Note   CSN: [id number] Arrival date & time: 04/09/23  1402     History  Chief Complaint  Patient presents with   Weakness    Samantha Fletcher is a 78 y.o. female.  With an extensive past medical history including but not limited to anxiety, depression, vertigo, fibromyalgia, hypothyroidism, hypertension, overactive bladder who presents to the ED for evaluation of weakness, body aches, nausea.  Her symptoms began approximately 4 weeks ago and have progressively gotten worse.  Her cough is productive of green sputum.  She has been unable to get out of bed for the past 4 days.  Has also been unable to eat for the past 5 days stating that she just does not have an appetite.  She has also had subjective fevers over the past 5 weeks.  She denies chest pain or shortness of breath.  She has had generalized mild abdominal pain and had a few episodes of emesis approximately 1 week ago which has resolved.  She has had dizziness since August of last year as well.  States she is following with a neurologist in [place] and has an MRI scheduled for July.  Dizziness is no worse than it normally is at this time.  She states she has been diagnosed with a UTI and was started on Myrbetriq.  Believe this is likely overactive bladder as this has been going on for many months.  She is not on antibiotics.  Sister is at bedside and states that she has also been progressively more confused over the past 4 weeks.  She states that she is unable to care for her at home anymore.   Weakness Associated symptoms: cough        Home Medications Prior to Admission medications   Medication Sig Start Date End Date Taking? Authorizing Provider  albuterol (PROAIR HFA) 108 (90 BASE) MCG/ACT inhaler Inhale 1 puff into the lungs every 4 (four) hours as needed for wheezing.     [provider]  allopurinol (ZYLOPRIM) 100 MG tablet Take 1 tablet by mouth  daily.    [provider]  ALPRAZolam Prudy Feeler) 1 MG tablet Take 0.5 tablets (0.5 mg total) by mouth 2 (two) times daily as needed for anxiety. Patient not taking: Reported on 03/12/2023 10/10/16   Edsel Petrin, DO  AMBULATORY NON FORMULARY MEDICATION Take 1 tablet by mouth as needed (other). Nicotine Tablets    [provider]  aspirin EC 81 MG tablet Take 81 mg by mouth as directed. 3 times a week    [provider]  Buprenorphine HCl (BELBUCA) 300 MCG FILM Place 1 Film inside cheek 2 (two) times daily. Patient not taking: Reported on 03/12/2023    [provider]  buPROPion (WELLBUTRIN XL) 300 MG 24 hr tablet Take 300 mg by mouth daily. Patient not taking: Reported on 03/12/2023    [provider]  Cholecalciferol (VITAMIN D3) 125 MCG (5000 UT) CAPS Take 1 capsule by mouth daily. Patient not taking: Reported on 03/12/2023    [provider]  cyclobenzaprine (FLEXERIL) 5 MG tablet Take 5 mg by mouth 3 (three) times daily as needed for muscle spasms. 09/07/18   [provider]  diclofenac sodium (VOLTAREN) 1 % GEL Apply 2 g topically daily as needed. Patient taking differently: Apply 2 g topically daily as needed (joint pain). 10/10/16   Edsel Petrin, DO  docusate sodium (STOOL SOFTENER) 100 MG capsule Take 100 mg by  mouth as needed for mild constipation.    [provider]  donepezil (ARICEPT) 5 MG tablet Take 5 mg by mouth daily with breakfast.    [provider]  Ferrous Sulfate (FERATAB PO) Take 1 tablet by mouth as directed. Take 1 tablet every 3 days    [provider]  fexofenadine (ALLEGRA) 180 MG tablet Take 180 mg by mouth daily. 09/24/10   [provider]  Ginkgo 60 MG TABS Take by mouth.    [provider]  levothyroxine (SYNTHROID) 75 MCG tablet Take by mouth. 03/04/17   [provider]  losartan-hydrochlorothiazide (HYZAAR) 100-12.5 MG per tablet TAKE 1 TABLET BY  MOUTH EVERY DAY Patient taking differently: Take 1 tablet by mouth daily. 07/13/15   Storm Frisk, MD  Magnesium (V-R MAGNESIUM) 250 MG TABS Take 3 tablets by mouth daily as needed (low mag).    [provider]  Milnacipran (SAVELLA) 50 MG TABS tablet Take 50 mg by mouth 2 (two) times daily. Patient not taking: Reported on 03/12/2023    [provider]  naloxegol oxalate (MOVANTIK) 25 MG TABS tablet Take 1 tablet by mouth daily as needed (constipation).    [provider]  naloxone [facility]) nasal spray 4 mg/0.1 mL Place 1 spray into the nose as needed (OD).    [provider]  pantoprazole (PROTONIX) 40 MG tablet Take 1 tablet (40 mg total) by mouth 2 (two) times daily. 04/07/20   Lynann Bologna, MD  Probiotic Product (PROBIOTIC ADVANCED) CAPS Take 1 tablet by mouth daily.    [provider]  promethazine (PHENERGAN) 25 MG tablet Take 1 tablet (25 mg total) by mouth every 6 (six) hours as needed for nausea or vomiting. Patient not taking: Reported on 03/12/2023 04/07/20   Lynann Bologna, MD  rosuvastatin (CRESTOR) 10 MG tablet Take 10 mg by mouth daily.    [provider]  telmisartan-hydrochlorothiazide (MICARDIS HCT) 40-12.5 MG tablet Take 1 tablet by mouth daily. 11/25/19   [provider]  zolpidem (AMBIEN) 5 MG tablet Take 5 mg by mouth at bedtime as needed for sleep. Patient not taking: Reported on 03/12/2023 05/04/20   [provider]      Allergies    Celecoxib, Gabapentin, Nabumetone, Naproxen, Tramadol, Lyrica [pregabalin], and Buspar [buspirone]    Review of Systems   Review of Systems  Respiratory:  Positive for cough.   Neurological:  Positive for weakness.    Physical Exam Updated Vital Signs BP (!) 144/66   Pulse 73   Temp 99.7 F (37.6 C) (Oral)   Resp 19   Ht 5\' 2"  (1.575 m)   Wt 45.4 kg   LMP  (LMP Unknown)   SpO2 96%   BMI 18.29 kg/m  Physical Exam Vitals and nursing note reviewed.   Constitutional:      General: She is not in acute distress.    Appearance: She is well-developed. She is not diaphoretic.     Comments: Chronically ill-appearing  HENT:     Head: Normocephalic and atraumatic.     Mouth/Throat:     Mouth: Mucous membranes are dry.  Eyes:     Conjunctiva/sclera: Conjunctivae normal.  Cardiovascular:     Rate and Rhythm: Normal rate and regular rhythm.     Heart sounds: No murmur heard. Pulmonary:     Effort: Pulmonary effort is normal. No respiratory distress.     Breath sounds: Rales (Right lower lobe) present.  Abdominal:     Palpations:  Abdomen is soft.     Tenderness: There is no abdominal tenderness. There is no guarding.  Musculoskeletal:        General: No swelling.     Cervical back: Neck supple.     Right lower leg: No edema.     Left lower leg: No edema.  Skin:    General: Skin is warm and dry.     Capillary Refill: Capillary refill takes less than 2 seconds.  Neurological:     Mental Status: She is alert and oriented to person, place, and time.  Psychiatric:        Mood and Affect: Mood normal.     ED Results / Procedures / Treatments   Labs (all labs ordered are listed, but only abnormal results are displayed) Labs Reviewed  BASIC METABOLIC PANEL - Abnormal; Notable for the following components:      Result Value   Sodium 132 (*)    Chloride 93 (*)    CO2 20 (*)    Glucose, Bld 106 (*)    BUN 51 (*)    Creatinine, Ser 1.32 (*)    GFR, Estimated 42 (*)    Anion gap 19 (*)    All other components within normal limits  CBC - Abnormal; Notable for the following components:   WBC 21.9 (*)    Hemoglobin 10.9 (*)    HCT 34.3 (*)    RDW 17.0 (*)    Platelets 429 (*)    All other components within normal limits  HEPATIC FUNCTION PANEL - Abnormal; Notable for the following components:   Albumin 2.7 (*)    AST 58 (*)    Total Bilirubin 1.3 (*)    Bilirubin, Direct 0.5 (*)    All other components within normal limits   I-STAT CHEM 8, ED - Abnormal; Notable for the following components:   Sodium 130 (*)    Potassium 3.4 (*)    BUN 52 (*)    Creatinine, Ser 1.40 (*)    Glucose, Bld 107 (*)    Calcium, Ion 0.83 (*)    TCO2 20 (*)    Hemoglobin 9.9 (*)    HCT 29.0 (*)    All other components within normal limits  SARS CORONAVIRUS 2 BY RT PCR  LIPASE, BLOOD  URINALYSIS, ROUTINE W REFLEX MICROSCOPIC  CBG MONITORING, ED  I-STAT VENOUS BLOOD GAS, ED    EKG None  Radiology CT Head Wo Contrast  Result Date: 04/09/2023 CLINICAL DATA:  Mental status change EXAM: CT HEAD WITHOUT CONTRAST TECHNIQUE: Contiguous axial images were obtained from the base of the skull through the vertex without intravenous contrast. RADIATION DOSE REDUCTION: This exam was performed according to the departmental dose-optimization program which includes automated exposure control, adjustment of the mA and/or kV according to patient size and/or use of iterative reconstruction technique. COMPARISON:  Head CT 02/05/2023 FINDINGS: Brain: There is no evidence for acute infarction, hemorrhage, hydrocephalus, extra-axial fluid collection or mass effect. Again seen is mild diffuse atrophy and extensive periventricular and deep white matter hypodensity, likely chronic small vessel ischemic change. Vascular: Atherosclerotic calcifications are present within the cavernous internal carotid arteries. Skull: Normal. Negative for fracture or focal lesion. Sinuses/Orbits: No acute finding. Other: None. IMPRESSION: 1. No acute intracranial process. 2. Mild diffuse atrophy and extensive chronic small vessel ischemic change. Electronically Signed   By: Darliss Cheney M.D.   On: 04/09/2023 18:10   DG Chest 1 View  Result Date: 04/09/2023 CLINICAL DATA:  Low oxygen saturation and weakness today. Upper and lower back pain. EXAM: CHEST  1 VIEW COMPARISON:  01/11/2023 FINDINGS: Shallow inspiration. Heart size and pulmonary vascularity are normal. Developing  infiltration in the lung bases bilaterally, greater on the right. This likely represents multifocal pneumonia or possibly atelectasis. Small right pleural effusion with fluid in the fissure. No pneumothorax. Calcified and tortuous aorta. Leads stimulators projecting over the midthoracic region. Old fracture deformity of the proximal right humerus. IMPRESSION: Developing infiltration or atelectasis in the lung bases bilaterally, greater on the right, likely pneumonia. Small right pleural effusion with fluid in the fissure. Electronically Signed   By: Burman Nieves M.D.   On: 04/09/2023 16:09    Procedures Procedures    Medications Ordered in ED Medications  azithromycin (ZITHROMAX) 500 mg in sodium chloride 0.9 % 250 mL IVPB (has no administration in time range)  sodium chloride 0.9 % bolus 1,000 mL (1,000 mLs Intravenous New Bag/Given 04/09/23 1828)  cefTRIAXone (ROCEPHIN) 1 g in sodium chloride 0.9 % 100 mL IVPB (1 g Intravenous New Bag/Given 04/09/23 1829)    ED Course/ Medical Decision Making/ A&P Clinical Course as of 04/09/23 1912  Wed Apr 09, 2023  1907 Spoke with hospitalist Dr. Antionette Char who will admit [AS]    Clinical Course User Index [AS] Lula Olszewski Edsel Petrin, PA-C                             Medical Decision Making Amount and/or Complexity of Data Reviewed Radiology: ordered.  This patient presents to the ED for concern of weakness, cough, this involves an extensive number of treatment options, and is a complaint that carries with it a high risk of complications and morbidity. Differential diagnosis for emergent cause of cough includes but is not limited to upper respiratory infection, lower respiratory infection, allergies, asthma, irritants, foreign body, medications such as ACE inhibitors, reflux, asthma, CHF, lung cancer, interstitial lung disease, psychiatric causes, postnasal drip and postinfectious bronchospasm.   Co morbidities that complicate the patient  evaluation  extensive past medical history including but not limited to anxiety, depression, vertigo, fibromyalgia, hypothyroidism, hypertension, overactive bladder  My initial workup includes labs, imaging, EKG  Additional history obtained from: Nursing notes from this visit. Family sister is at bedside and provides a portion of the history  I ordered, reviewed and interpreted labs which include: CBC, CMP, lipase, urinalysis, COVID.  Significant leukocytosis of 21.9.  Stable anemia with a hemoglobin of 10.9.  Thrombocytosis of 429.  BMP significant for hyponatremia of 132, hypochloremia of 93, decreased bicarb of 20, BUN of 51, creatinine of 1.32, anion gap of 19.  This is likely starvation ketosis and dehydration.  I ordered imaging studies including chest x-ray, CT head I independently visualized and interpreted imaging which showed bilateral with right worse than left pneumonia, no acute findings in the head I agree with the radiologist interpretation  Cardiac Monitoring:  The patient was maintained on a cardiac monitor.  I personally viewed and interpreted the cardiac monitored which showed an underlying rhythm of: NSR  Consultations Obtained:  I requested consultation with the hospitalist Dr. Antionette Char,  and discussed lab and imaging findings as well as pertinent plan - they recommend: Admission  Afebrile, hypoxic on room air but otherwise hemodynamically stable.  77 year old female presenting to the ED for evaluation of weakness, cough, subjective fevers.  Has been unable to get out of her bed for the past 5 days.  Has not been eating for 5 days.  On exam, she is chronically ill-appearing.  She has rales in the right lower lobe.  She was hypoxic to 87% on arrival.  She also has evidence of starvation ketosis with a decreased sodium, potassium, bicarb and an elevated anion gap.  This is likely compounded by her dehydration induced acute kidney injury with a creatinine of 1.32 and BUN of 51.   Chest x-ray consistent with pneumonia.  CT head relatively reassuring.  Patient will benefit from admission for her pneumonia given multifocal nature and hypoxia.  I discussed this with patient who is in agreement with the plan.  Stable at the time of admission.  Note: Portions of this report may have been transcribed using voice recognition software. Every effort was made to ensure accuracy; however, inadvertent computerized transcription errors may still be present.        Final Clinical Impression(s) / ED Diagnoses Final diagnoses:  Multifocal pneumonia  AKI (acute kidney injury) [facility])    Rx / DC Orders ED Discharge Orders     None         Mora Bellman 04/09/23 1912    Benjiman Core, MD 04/09/23 2251
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
## 2023-04-09 NOTE — ED Triage Notes (Signed)
Pt arrives POV with her sister. Pt complains of generalized pain, weakness, and not eating in 5 days. Pt also has some nausea and vomiting.  Pt oxygen saturation in triage 87% on room air. Pt placed on 2 LNC

## 2023-04-09 NOTE — ED Notes (Signed)
Patient sister, Iverson Alamin said to please call with updates [phone].

## 2023-04-09 NOTE — ED Notes (Signed)
Going to c-t 

## 2023-04-10 DIAGNOSIS — J189 Pneumonia, unspecified organism: Secondary | ICD-10-CM | POA: Diagnosis not present

## 2023-04-10 DIAGNOSIS — J9601 Acute respiratory failure with hypoxia: Secondary | ICD-10-CM | POA: Diagnosis not present

## 2023-04-10 LAB — RESPIRATORY PANEL BY PCR

## 2023-04-10 LAB — CBC
HCT: 28.2 % — ABNORMAL LOW (ref 36.0–46.0)
Hemoglobin: 9.1 g/dL — ABNORMAL LOW (ref 12.0–15.0)
MCH: 27.7 pg (ref 26.0–34.0)
MCHC: 32.3 g/dL (ref 30.0–36.0)
MCV: 85.7 fL (ref 80.0–100.0)
Platelets: 374 10*3/uL (ref 150–400)
RBC: 3.29 MIL/uL — ABNORMAL LOW (ref 3.87–5.11)
RDW: 17 % — ABNORMAL HIGH (ref 11.5–15.5)
WBC: 20.5 10*3/uL — ABNORMAL HIGH (ref 4.0–10.5)
nRBC: 0 % (ref 0.0–0.2)

## 2023-04-10 LAB — BASIC METABOLIC PANEL
Anion gap: 13 (ref 5–15)
BUN: 42 mg/dL — ABNORMAL HIGH (ref 8–23)
CO2: 22 mmol/L (ref 22–32)
Calcium: 8.1 mg/dL — ABNORMAL LOW (ref 8.9–10.3)
Chloride: 96 mmol/L — ABNORMAL LOW (ref 98–111)
Creatinine, Ser: 1.2 mg/dL — ABNORMAL HIGH (ref 0.44–1.00)
GFR, Estimated: 48 mL/min — ABNORMAL LOW (ref >60)
Glucose, Bld: 92 mg/dL (ref 70–99)
Potassium: 3.1 mmol/L — ABNORMAL LOW (ref 3.5–5.1)
Sodium: 131 mmol/L — ABNORMAL LOW (ref 135–145)

## 2023-04-10 LAB — PHOSPHORUS: Phosphorus: 3 mg/dL (ref 2.5–4.6)

## 2023-04-10 LAB — STREP PNEUMONIAE URINARY ANTIGEN: Strep Pneumo Urinary Antigen: NEGATIVE

## 2023-04-10 LAB — PROCALCITONIN: Procalcitonin: 29.95 ng/mL

## 2023-04-10 LAB — MAGNESIUM: Magnesium: 2.1 mg/dL (ref 1.7–2.4)

## 2023-04-10 MED ORDER — DULOXETINE HCL 60 MG PO CPEP
60.0000 mg | ORAL_CAPSULE | Freq: Every day | ORAL | Status: DC
  Administered 2023-04-10 – 2023-04-15 (×6): 60 mg via ORAL
  Filled 2023-04-10 (×6): qty 1

## 2023-04-10 MED ORDER — HYDRALAZINE HCL 25 MG PO TABS: 25.0000 mg | ORAL_TABLET | Freq: Four times a day (QID) | ORAL | Status: DC | PRN

## 2023-04-10 MED ORDER — POTASSIUM CHLORIDE CRYS ER 20 MEQ PO TBCR
20.0000 meq | EXTENDED_RELEASE_TABLET | Freq: Once | ORAL | Status: AC
  Administered 2023-04-10: 20 meq via ORAL
  Filled 2023-04-10: qty 1

## 2023-04-10 MED ORDER — AMLODIPINE BESYLATE 5 MG PO TABS
5.0000 mg | ORAL_TABLET | Freq: Every day | ORAL | Status: DC
  Administered 2023-04-10: 5 mg via ORAL
  Filled 2023-04-10: qty 1

## 2023-04-10 MED ORDER — POTASSIUM CHLORIDE CRYS ER 20 MEQ PO TBCR
40.0000 meq | EXTENDED_RELEASE_TABLET | Freq: Four times a day (QID) | ORAL | Status: AC
  Administered 2023-04-10 (×2): 40 meq via ORAL
  Filled 2023-04-10 (×2): qty 2

## 2023-04-10 MED ORDER — TRAZODONE HCL 50 MG PO TABS
50.0000 mg | ORAL_TABLET | Freq: Every day | ORAL | Status: DC
  Administered 2023-04-10 – 2023-04-14 (×5): 50 mg via ORAL
  Filled 2023-04-10 (×5): qty 1

## 2023-04-10 MED ORDER — PANTOPRAZOLE SODIUM 40 MG PO TBEC
40.0000 mg | DELAYED_RELEASE_TABLET | Freq: Two times a day (BID) | ORAL | Status: DC
  Administered 2023-04-10 – 2023-04-15 (×11): 40 mg via ORAL
  Filled 2023-04-10 (×11): qty 1

## 2023-04-10 MED ORDER — DOCUSATE SODIUM 100 MG PO CAPS
100.0000 mg | ORAL_CAPSULE | Freq: Two times a day (BID) | ORAL | Status: DC | PRN
  Administered 2023-04-14 – 2023-04-15 (×2): 100 mg via ORAL
  Filled 2023-04-10 (×2): qty 1

## 2023-04-10 NOTE — Progress Notes (Addendum)
PROGRESS NOTE    Samantha Fletcher  ZOX:[id number] DOB: Jun 28, 1946 DOA: 04/09/2023 PCP: Marylen Ponto, MD   Chief Complaint  Patient presents with   Weakness    Brief Narrative:   Samantha Fletcher is a 77 y.o. female with medical history significant for hypertension, COPD, CKD 3A, OSA with CPAP intolerance, PAF not anticoagulated, depression, anxiety, and chronic pain who presents to the emergency department for evaluation of productive cough, shortness of breath, fatigue, and loss of appetite.   Patient reports at least 5 days of progressive fatigue with worsening productive cough, shortness of breath, and loss of appetite.  She also reports some episodes of nausea with nonbloody vomiting.  She denies abdominal pain, chest pain, or diarrhea.   Upon arrival to the ED, patient is found to be afebrile and saturating upper 80s on room air with mild tachypnea, normal heart rate, and stable blood pressure.  EKG demonstrates sinus rhythm.  Head CT is negative for acute intracranial abnormality.  Chest x-ray is concerning for developing pneumonia.  Labs are most notable for sodium 132, BUN 51, creatinine 1.32, and WBC 21,900.  COVID PCR was negative.   Patient was started on supplemental oxygen in the ED, given 1 L of normal saline, and treated with Rocephin and azithromycin.     Assessment & Plan:   Principal Problem:   Pneumonia Active Problems:   Anxiety and depression   Hypothyroidism   CKD stage 3a, GFR 45-59 ml/min (HCC)   COPD (chronic obstructive pulmonary disease) (HCC)   Hypertension   Chronic pain syndrome   PAF (paroxysmal atrial fibrillation) (HCC)   Memory impairment   Acute respiratory failure with hypoxia (HCC)   Hyponatremia  Acute hypoxic respiratory failure  Pneumonia - Continue Rocephin and azithromycin,  -strep pneumonia is negative and Legionella antigen is pending -Resp viral panel is negative -He remains on 2 L oxygen this morning, she was again encouraged use  incentive spirometry and flutter valve, she was encouraged to ambulate and to get out of bed to chair. -Procalcitonin significantly elevated on admission, trending down.    Hx of CVA  - Continue ASA and statin      Hypertension  - Hold HCTZ in light of hypovolemia and hyponatremia  -Started on Norvasc due to increased blood pressure, continue with as needed meds as well.      CKD IIIa  - SCr is 1.32 on admission, appears close to baseline based on labs in Care Everywhere  - Renally-dose medications, monitor    PAF  - Had an isolated episode in June 2023, declined anticoagulation when offered by cardiology   - Continue aspirin, monitor   Hypothyroidism  - Continue Synthroid    COPD  - Not in exacerbation on admission  - Continue inhalers     Memory impairment  - Continue Aricept, use delirium precautions   Weakness/deconditioning -Continue with PT/OT  Hyponatremia -due to volume depletion and dehydration, continue with IV fluidsDue to volume  Hypokalemia  - repleted  DVT prophylaxis: Heparin Code Status: Full Family Communication: none at bedside Disposition:   Status is: Inpatient    Consultants:  none   Subjective:  For generalized weakness, fatigue, cough and shortness of breath  Objective: Vitals:   04/10/23 0402 04/10/23 0404 04/10/23 0803 04/10/23 1152  BP: (!) 125/57  (!) 129/58 137/61  Pulse: 65  69 60  Resp: 20  20 18   Temp: 99.8 F (37.7 C)  98.6 F (37 C) 98 F (  36.7 C)  TempSrc: Oral  Oral Oral  SpO2:    100%  Weight:  45.6 kg    Height:       No intake or output data in the 24 hours ending 04/10/23 1428 Filed Weights   04/09/23 1422 04/10/23 0404  Weight: 45.4 kg 45.6 kg    Examination:  Awake Alert, extremely frail, deconditioned Symmetrical Chest wall movement, Good air movement bilaterally, Rales at the bases RRR,No Gallops,Rubs or new Murmurs, No Parasternal Heave +ve B.Sounds, Abd Soft, No tenderness, No rebound -  guarding or rigidity. No Cyanosis, Clubbing or edema, No new Rash or bruise       Data Reviewed: I have personally reviewed following labs and imaging studies  CBC: Recent Labs  Lab 04/09/23 1512 04/09/23 1731 04/10/23 0421  WBC 21.9*  --  20.5*  HGB 10.9* 9.9* 9.1*  HCT 34.3* 29.0* 28.2*  MCV 85.1  --  85.7  PLT 429*  --  374    Basic Metabolic Panel: Recent Labs  Lab 04/09/23 1512 04/09/23 1731 04/10/23 0421  NA 132* 130* 131*  K 3.9 3.4* 3.1*  CL 93* 100 96*  CO2 20*  --  22  GLUCOSE 106* 107* 92  BUN 51* 52* 42*  CREATININE 1.32* 1.40* 1.20*  CALCIUM 9.0  --  8.1*  MG  --   --  2.1  PHOS  --   --  3.0    GFR: Estimated Creatinine Clearance: 29.6 mL/min (A) (by C-G formula based on SCr of 1.2 mg/dL (H)).  Liver Function Tests: Recent Labs  Lab 04/09/23 1725  AST 58*  ALT 28  ALKPHOS 117  BILITOT 1.3*  PROT 7.0  ALBUMIN 2.7*    CBG: Recent Labs  Lab 04/09/23 1436  GLUCAP 99     Recent Results (from the past 240 hour(s))  SARS Coronavirus 2 by RT PCR (hospital order, performed in [facility] hospital lab) *cepheid single result test* Anterior Nasal Swab     Status: None   Collection Time: 04/09/23  2:35 PM   Specimen: Anterior Nasal Swab  Result Value Ref Range Status   SARS Coronavirus 2 by RT PCR NEGATIVE NEGATIVE Final    Comment: Performed at [facility], [address], [place], [place] [address]         Radiology Studies: CT Head Wo Contrast  Result Date: 04/09/2023 CLINICAL DATA:  Mental status change EXAM: CT HEAD WITHOUT CONTRAST TECHNIQUE: Contiguous axial images were obtained from the base of the skull through the vertex without intravenous contrast. RADIATION DOSE REDUCTION: This exam was performed according to the departmental dose-optimization program which includes automated exposure control, adjustment of the mA and/or kV according to patient size and/or use of iterative reconstruction technique. COMPARISON:  Head  CT 02/05/2023 FINDINGS: Brain: There is no evidence for acute infarction, hemorrhage, hydrocephalus, extra-axial fluid collection or mass effect. Again seen is mild diffuse atrophy and extensive periventricular and deep white matter hypodensity, likely chronic small vessel ischemic change. Vascular: Atherosclerotic calcifications are present within the cavernous internal carotid arteries. Skull: Normal. Negative for fracture or focal lesion. Sinuses/Orbits: No acute finding. Other: None. IMPRESSION: 1. No acute intracranial process. 2. Mild diffuse atrophy and extensive chronic small vessel ischemic change. Electronically Signed   By: Darliss Cheney M.D.   On: 04/09/2023 18:10   DG Chest 1 View  Result Date: 04/09/2023 CLINICAL DATA:  Low oxygen saturation and weakness today. Upper and lower back pain. EXAM: CHEST  1 VIEW COMPARISON:  01/11/2023 FINDINGS: Shallow inspiration. Heart size and pulmonary vascularity are normal. Developing infiltration in the lung bases bilaterally, greater on the right. This likely represents multifocal pneumonia or possibly atelectasis. Small right pleural effusion with fluid in the fissure. No pneumothorax. Calcified and tortuous aorta. Leads stimulators projecting over the midthoracic region. Old fracture deformity of the proximal right humerus. IMPRESSION: Developing infiltration or atelectasis in the lung bases bilaterally, greater on the right, likely pneumonia. Small right pleural effusion with fluid in the fissure. Electronically Signed   By: Burman Nieves M.D.   On: 04/09/2023 16:09        Scheduled Meds:  aspirin EC  81 mg Oral Q M,W,F-1800   azithromycin  500 mg Oral Daily   donepezil  5 mg Oral Q breakfast   heparin  5,000 Units Subcutaneous Q8H   levothyroxine  75 mcg Oral Q0600   rosuvastatin  10 mg Oral Daily   Continuous Infusions:  cefTRIAXone (ROCEPHIN)  IV       LOS: 1 day        Huey Bienenstock, MD [facility]   To contact  the attending provider between 7A-7P or the covering provider during after hours 7P-7A, please log into the web site [email or web address] and access using universal [place] password for that web site. If you do not have the password, please call the hospital operator.  04/10/2023, 2:28 PM

## 2023-04-11 DIAGNOSIS — I10 Essential (primary) hypertension: Secondary | ICD-10-CM | POA: Diagnosis not present

## 2023-04-11 DIAGNOSIS — N1831 Chronic kidney disease, stage 3a: Secondary | ICD-10-CM | POA: Diagnosis not present

## 2023-04-11 DIAGNOSIS — J189 Pneumonia, unspecified organism: Secondary | ICD-10-CM | POA: Diagnosis not present

## 2023-04-11 LAB — BASIC METABOLIC PANEL
Anion gap: 13 (ref 5–15)
BUN: 38 mg/dL — ABNORMAL HIGH (ref 8–23)
CO2: 20 mmol/L — ABNORMAL LOW (ref 22–32)
Calcium: 8.9 mg/dL (ref 8.9–10.3)
Chloride: 100 mmol/L (ref 98–111)
Creatinine, Ser: 0.95 mg/dL (ref 0.44–1.00)
GFR, Estimated: >60 mL/min (ref >60)
Glucose, Bld: 81 mg/dL (ref 70–99)
Potassium: 5.1 mmol/L (ref 3.5–5.1)
Sodium: 133 mmol/L — ABNORMAL LOW (ref 135–145)

## 2023-04-11 LAB — CBC
HCT: 27.2 % — ABNORMAL LOW (ref 36.0–46.0)
Hemoglobin: 8.7 g/dL — ABNORMAL LOW (ref 12.0–15.0)
MCH: 28 pg (ref 26.0–34.0)
MCHC: 32 g/dL (ref 30.0–36.0)
MCV: 87.5 fL (ref 80.0–100.0)
Platelets: 407 10*3/uL — ABNORMAL HIGH (ref 150–400)
RBC: 3.11 MIL/uL — ABNORMAL LOW (ref 3.87–5.11)
RDW: 17.2 % — ABNORMAL HIGH (ref 11.5–15.5)
WBC: 15.9 10*3/uL — ABNORMAL HIGH (ref 4.0–10.5)
nRBC: 0 % (ref 0.0–0.2)

## 2023-04-11 LAB — LEGIONELLA PNEUMOPHILA SEROGP 1 UR AG: L. pneumophila Serogp 1 Ur Ag: NEGATIVE

## 2023-04-11 LAB — PROCALCITONIN: Procalcitonin: 17.54 ng/mL

## 2023-04-11 NOTE — Evaluation (Signed)
Occupational Therapy Evaluation Patient Details Name: Samantha Fletcher MRN: [id number] DOB: [birth year] Today's Date: 04/11/2023   History of Present Illness 77 yo female with onset of cough and fatigue, SOB and loss of appetite was admitted 5/15. Dx with acute respiratory failure, PNA, and O2 use. Replenished Na+ and K+. PMHx:  anxiety, depression, memory changes, COPD, CKD3a, hypothyroidism, HTN, chronic pain, PAF, hyponatremia, CVA, weakness   Clinical Impression   PTA, pt lived with son who assisted with home management. Per pt financial management has been difficult for her;m discussed seeking supervision from son. Per pt son able to provide 24/7 supervision at home. Pt limited by decreased activity tolerance, balance, cognition, and safety. Pt performing basic transfers with min guard A and ADL with up to min A. Will continue to follow and recommending follow up OT in natural setting.      Recommendations for follow up therapy are one component of a multi-disciplinary discharge planning process, led by the attending physician.  Recommendations may be updated based on patient status, additional functional criteria and insurance authorization.   Assistance Recommended at Discharge Intermittent Supervision/Assistance  Patient can return home with the following A little help with walking and/or transfers;A little help with bathing/dressing/bathroom;Assistance with cooking/housework;Direct supervision/assist for medications management;Direct supervision/assist for financial management;Assist for transportation;Help with stairs or ramp for entrance    Functional Status Assessment  Patient has had a recent decline in their functional status and demonstrates the ability to make significant improvements in function in a reasonable and predictable amount of time.  Equipment Recommendations  None recommended by OT    Recommendations for Other Services       Precautions / Restrictions  Precautions Precautions: Fall Restrictions Weight Bearing Restrictions: No      Mobility Bed Mobility Overal bed mobility: Needs Assistance Bed Mobility: Sit to Supine     Supine to sit: Min guard          Transfers Overall transfer level: Needs assistance Equipment used: Rolling walker (2 wheels), 1 person hand held assist Transfers: Sit to/from Stand, Bed to chair/wheelchair/BSC Sit to Stand: Min guard     Step pivot transfers: Min guard     General transfer comment: min guard for safety      Balance Overall balance assessment: Needs assistance Sitting-balance support: Feet supported Sitting balance-Leahy Scale: Fair     Standing balance support: Bilateral upper extremity supported, During functional activity Standing balance-Leahy Scale: Fair                             ADL either performed or assessed with clinical judgement   ADL Overall ADL's : Needs assistance/impaired Eating/Feeding: Modified independent;Bed level   Grooming: Set up;Sitting;Supervision/safety   Upper Body Bathing: Sitting;Set up   Lower Body Bathing: Minimal assistance;Sit to/from stand   Upper Body Dressing : Set up;Sitting   Lower Body Dressing: Minimal assistance;Sit to/from stand   Toilet Transfer: Min guard;Stand-pivot;Rolling walker (2 wheels)           Functional mobility during ADLs: Min guard;Rolling walker (2 wheels)       Vision Baseline Vision/History: 1 Wears glasses Ability to See in Adequate Light: 0 Adequate Patient Visual Report: No change from baseline Vision Assessment?: No apparent visual deficits Additional Comments: wearing glasses while reading menu     Perception     Praxis      Pertinent Vitals/Pain Pain Assessment Pain Assessment: Faces Faces Pain Scale: Hurts little  more Pain Location: generalized Pain Descriptors / Indicators: Sore, Aching Pain Intervention(s): Limited activity within patient's tolerance, Monitored  during session     Hand Dominance Right   Extremity/Trunk Assessment Upper Extremity Assessment Upper Extremity Assessment: Generalized weakness (Pt reports R shoulder injury after a fall in august)   Lower Extremity Assessment Lower Extremity Assessment: Generalized weakness   Cervical / Trunk Assessment Cervical / Trunk Assessment: Kyphotic (mild)   Communication Communication Communication: No difficulties   Cognition Arousal/Alertness: Awake/alert Behavior During Therapy: WFL for tasks assessed/performed Overall Cognitive Status: No family/caregiver present to determine baseline cognitive functioning                                 General Comments: Unsure how far from baseline, but pt reports feeling foggy and that financial management has been more difficult for her recently. recalled a name and address during delayed memory recall, but extreme incresaed time for counting backward and performing simple money managemet task. Some decreased problem solving during daily activity and reasoning. Also could not find room phone despite it having been next to her (lack of scanning)     General Comments  pt reports uninary incontinence at baseline    Exercises     Shoulder Instructions      Home Living Family/patient expects to be discharged to:: Private residence Living Arrangements: Children Available Help at Discharge: Family;Available 24 hours/day Type of Home: House Home Access: Ramped entrance;Stairs to enter Entrance Stairs-Number of Steps: 1 Entrance Stairs-Rails: Can reach both Home Layout: One level     Bathroom Shower/Tub: Producer, television/film/video: Standard     Home Equipment: Agricultural consultant (2 wheels);Cane - single point;BSC/3in1;Grab bars - tub/shower          Prior Functioning/Environment Prior Level of Function : Independent/Modified Independent             Mobility Comments: self mobile on RW vs cane, does her own  dressing and bathing ADLs Comments: Pt reports intermittent assist with dressing and bathing but she normally does herself. Manages her own medications. Reports finances are getting more difficult to manage independently        OT Problem List: Decreased strength;Decreased activity tolerance;Impaired balance (sitting and/or standing);Decreased safety awareness;Decreased knowledge of use of DME or AE      OT Treatment/Interventions: Self-care/ADL training;Therapeutic exercise;DME and/or AE instruction;Therapeutic activities;Cognitive remediation/compensation;Patient/family education;Balance training    OT Goals(Current goals can be found in the care plan section) Acute Rehab OT Goals Patient Stated Goal: get better OT Goal Formulation: With patient Time For Goal Achievement: 04/25/23 Potential to Achieve Goals: Good  OT Frequency: Min 2X/week    Co-evaluation              AM-PAC OT "6 Clicks" Daily Activity     Outcome Measure Help from another person eating meals?: None Help from another person taking care of personal grooming?: A Little Help from another person toileting, which includes using toliet, bedpan, or urinal?: A Little Help from another person bathing (including washing, rinsing, drying)?: A Little Help from another person to put on and taking off regular upper body clothing?: A Little Help from another person to put on and taking off regular lower body clothing?: A Little 6 Click Score: 19   End of Session Equipment Utilized During Treatment: Gait belt;Rolling walker (2 wheels) Nurse Communication: Mobility status  Activity Tolerance: Patient tolerated treatment well Patient left:  in bed;with call bell/phone within reach;with bed alarm set  OT Visit Diagnosis: Unsteadiness on feet (R26.81);Muscle weakness (generalized) (M62.81);Other symptoms and signs involving cognitive function;History of falling (Z91.81)                Time: 1610-9604 OT Time Calculation  (min): 20 min Charges:  OT General Charges $OT Visit: 1 Visit OT Evaluation $OT Eval Low Complexity: 1 Low  Tyler Deis, OTR/L [facility] Acute Rehabilitation Office: [phone]   Samantha Fletcher 04/11/2023, 5:29 PM
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
## 2023-04-11 NOTE — Progress Notes (Signed)
Physical Therapy Evaluation Patient Details Name: Samantha Fletcher MRN: [id number] DOB: [birth year] Today's Date: 04/11/2023  History of Present Illness  77 yo female with onset of cough and fatigue, SOB and loss of appetite was admitted 5/15. Dx with acute respiratory failure, PNA, and O2 use. Replenished Na+ and K+. PMHx:  anxiety, depression, memory changes, COPD, CKD3a, hypothyroidism, HTN, chronic pain, PAF, hyponatremia, CVA, weakness  Clinical Impression  Pt was seen for evaluation today of her mobility and noted her previous independent status at home with her son.  Pt is getting up to walk with RW to chair, and noted her incontinence which she had not asked nursing to assist her with yet.  Helped pt change from wet gown and bed, and was up in the chair with legs elevated and call light in place, after initially asking to just return to bed.  However she is aware of the need to regather her strength to go home.  Given her initial level of safety to walk will recommend she go home with HHPT and her son to assist, but may need to change her plans to SNF if the family cannot give support to her care.  Pt has been a bit of a caregiver to son but he is the person who helps with home and does errands.  Follow acutely for goals of PT.     Recommendations for follow up therapy are one component of a multi-disciplinary discharge planning process, led by the attending physician.  Recommendations may be updated based on patient status, additional functional criteria and insurance authorization.  Follow Up Recommendations Can patient physically be transported by private vehicle: Yes     Assistance Recommended at Discharge Frequent or constant Supervision/Assistance  Patient can return home with the following  A little help with walking and/or transfers;A little help with bathing/dressing/bathroom;Assistance with cooking/housework;Assist for transportation;Help with stairs or ramp for entrance     Equipment Recommendations None recommended by PT  Recommendations for Other Services       Functional Status Assessment Patient has had a recent decline in their functional status and demonstrates the ability to make significant improvements in function in a reasonable and predictable amount of time.     Precautions / Restrictions Precautions Precautions: Fall Restrictions Weight Bearing Restrictions: No      Mobility  Bed Mobility Overal bed mobility: Needs Assistance Bed Mobility: Supine to Sit     Supine to sit: Min guard     General bed mobility comments: cued and assisted to get changed from wet clothing, which she had not told to nursing staff    Transfers Overall transfer level: Needs assistance Equipment used: Rolling walker (2 wheels), 1 person hand held assist Transfers: Sit to/from Stand, Bed to chair/wheelchair/BSC Sit to Stand: Min guard   Step pivot transfers: Min guard       General transfer comment: min guard for safety    Ambulation/Gait Ambulation/Gait assistance: Min guard Gait Distance (Feet): 5 Feet Assistive device: Rolling walker (2 wheels), 1 person hand held assist Gait Pattern/deviations: Step-to pattern, Step-through pattern, Decreased stride length Gait velocity: reduced Gait velocity interpretation: <1.31 ft/sec, indicative of household ambulator Pre-gait activities: standing balance ck General Gait Details: short walk as pt was tired and did not want to go far  Stairs            Wheelchair Mobility    Modified Rankin (Stroke Patients Only)       Balance Overall balance assessment: Needs assistance Sitting-balance  support: Feet supported Sitting balance-Leahy Scale: Fair     Standing balance support: Bilateral upper extremity supported, During functional activity Standing balance-Leahy Scale: Fair Standing balance comment: less than fair dynamically                             Pertinent Vitals/Pain  Pain Assessment Pain Assessment: Faces Faces Pain Scale: No hurt    Home Living Family/patient expects to be discharged to:: Private residence Living Arrangements: Children Available Help at Discharge: Family;Available 24 hours/day Type of Home: House Home Access: Ramped entrance;Stairs to enter Entrance Stairs-Rails: Can reach both Entrance Stairs-Number of Steps: 1   Home Layout: One level Home Equipment: Agricultural consultant (2 wheels);Cane - single point;BSC/3in1;Grab bars - tub/shower      Prior Function Prior Level of Function : Independent/Modified Independent             Mobility Comments: self mobile on RW vs cane, does her own dressing and bathing       Hand Dominance   Dominant Hand: Right    Extremity/Trunk Assessment   Upper Extremity Assessment Upper Extremity Assessment: Overall WFL for tasks assessed    Lower Extremity Assessment Lower Extremity Assessment: Overall WFL for tasks assessed    Cervical / Trunk Assessment Cervical / Trunk Assessment: Kyphotic (mild)  Communication   Communication: No difficulties  Cognition Arousal/Alertness: Awake/alert Behavior During Therapy: WFL for tasks assessed/performed Overall Cognitive Status: No family/caregiver present to determine baseline cognitive functioning                                 General Comments: unsure if she is baseline or having issues from normal cognition        General Comments General comments (skin integrity, edema, etc.): pt was seen for mobility and did not comment to staff about being in a wet bed with catheter not effectively managing moisture    Exercises     Assessment/Plan    PT Assessment Patient needs continued PT services  PT Problem List Decreased strength;Decreased activity tolerance;Decreased balance;Decreased coordination       PT Treatment Interventions DME instruction;Gait training;Stair training;Functional mobility training;Therapeutic  activities;Therapeutic exercise;Balance training;Neuromuscular re-education;Patient/family education    PT Goals (Current goals can be found in the Care Plan section)  Acute Rehab PT Goals Patient Stated Goal: to be able to walk and manage independently at home PT Goal Formulation: With patient Time For Goal Achievement: 04/18/23 Potential to Achieve Goals: Good    Frequency Min 3X/week     Co-evaluation               AM-PAC PT "6 Clicks" Mobility  Outcome Measure Help needed turning from your back to your side while in a flat bed without using bedrails?: A Little Help needed moving from lying on your back to sitting on the side of a flat bed without using bedrails?: A Little Help needed moving to and from a bed to a chair (including a wheelchair)?: A Little Help needed standing up from a chair using your arms (e.g., wheelchair or bedside chair)?: A Little Help needed to walk in hospital room?: A Little Help needed climbing 3-5 steps with a railing? : Total 6 Click Score: 16    End of Session Equipment Utilized During Treatment: Gait belt Activity Tolerance: Patient limited by fatigue Patient left: with chair alarm set;in chair;with call bell/phone within  reach Nurse Communication: Mobility status PT Visit Diagnosis: Unsteadiness on feet (R26.81);Muscle weakness (generalized) (M62.81);Difficulty in walking, not elsewhere classified (R26.2)    Time: 1535-1601 PT Time Calculation (min) (ACUTE ONLY): 26 min   Charges:   PT Evaluation $PT Eval Moderate Complexity: 1 Mod PT Treatments $Therapeutic Activity: 8-22 mins       Ivar Drape 04/11/2023, 4:49 PM  Samul Dada, PT PhD Acute Rehab Dept. Number: [facility] [id number] and [facility] [phone]

## 2023-04-11 NOTE — Progress Notes (Signed)
PROGRESS NOTE    Samantha Fletcher  MVH:[id number] DOB: 29-Jul-1946 DOA: 04/09/2023 PCP: Marylen Ponto, MD   Chief Complaint  Patient presents with   Weakness    Brief Narrative:   RENIYA Fletcher is a 77 y.o. female with medical history significant for hypertension, COPD, CKD 3A, OSA with CPAP intolerance, PAF not anticoagulated, depression, anxiety, and chronic pain who presents to the emergency department for evaluation of productive cough, shortness of breath, fatigue, and loss of appetite.   Patient reports at least 5 days of progressive fatigue with worsening productive cough, shortness of breath, and loss of appetite.  She also reports some episodes of nausea with nonbloody vomiting.  She denies abdominal pain, chest pain, or diarrhea.   Upon arrival to the ED, patient is found to be afebrile and saturating upper 80s on room air with mild tachypnea, normal heart rate, and stable blood pressure.  EKG demonstrates sinus rhythm.  Head CT is negative for acute intracranial abnormality.  Chest x-ray is concerning for developing pneumonia.  Labs are most notable for sodium 132, BUN 51, creatinine 1.32, and WBC 21,900.  COVID PCR was negative.   Patient was started on supplemental oxygen in the ED, given 1 L of normal saline, and treated with Rocephin and azithromycin.     Assessment & Plan:   Principal Problem:   Pneumonia Active Problems:   Anxiety and depression   Hypothyroidism   CKD stage 3a, GFR 45-59 ml/min (HCC)   COPD (chronic obstructive pulmonary disease) (HCC)   Hypertension   Chronic pain syndrome   PAF (paroxysmal atrial fibrillation) (HCC)   Memory impairment   Acute respiratory failure with hypoxia (HCC)   Hyponatremia  Acute hypoxic respiratory failure  Pneumonia - Continue Rocephin and azithromycin,  -strep pneumonia is negative and Legionella antigen is pending -Resp viral panel is negative -He remains on 2 L oxygen this morning, she was again encouraged use  incentive spirometry and flutter valve, she was encouraged to ambulate and to get out of bed to chair. -Procalcitonin significantly elevated on admission, trending down.    Hx of CVA  - Continue ASA and statin      Hypertension  - Hold HCTZ in light of hypovolemia and hyponatremia  -Started on Norvasc yesterday, discontinued this morning given soft blood pressure -Continue with as needed pain meds for now   CKD IIIa  - SCr is 1.32 on admission, appears close to baseline based on labs in Care Everywhere  - Renally-dose medications, monitor    PAF  - Had an isolated episode in June 2023, declined anticoagulation when offered by cardiology   - Continue aspirin, monitor   Hypothyroidism  - Continue Synthroid    COPD  - Not in exacerbation on admission  - Continue inhalers     Memory impairment  - Continue Aricept, use delirium precautions   Weakness/deconditioning -Continue with PT/OT  Hyponatremia -due to volume depletion and dehydration, continue with IV fluidsDue to volume  Hypokalemia  - repleted  DVT prophylaxis: Heparin Code Status: Full Family Communication: none at bedside Disposition:   Status is: Inpatient    Consultants:  none   Subjective:  For generalized weakness, fatigue, cough and shortness of breath  Objective: Vitals:   04/10/23 2116 04/10/23 2338 04/11/23 0440 04/11/23 0756  BP: (!) 110/58 120/61 (!) 118/49 129/62  Pulse: 66 62 60 60  Resp: 18 18 18 18   Temp: 98.3 F (36.8 C) 97.6 F (36.4 C) 98.2 F (  36.8 C) 98 F (36.7 C)  TempSrc: Oral Oral Oral Oral  SpO2:    95%  Weight:      Height:        Intake/Output Summary (Last 24 hours) at 04/11/2023 1111 Last data filed at 04/10/2023 1700 Gross per 24 hour  Intake --  Output 250 ml  Net -250 ml   Filed Weights   04/09/23 1422 04/10/23 0404  Weight: 45.4 kg 45.6 kg    Examination:  Awake Alert, extremely frail, deconditioned Symmetrical Chest wall movement, Good air  movement bilaterally, Rales at the bases RRR,No Gallops,Rubs or new Murmurs, No Parasternal Heave +ve B.Sounds, Abd Soft, No tenderness, No rebound - guarding or rigidity. No Cyanosis, Clubbing or edema, No new Rash or bruise       Data Reviewed: I have personally reviewed following labs and imaging studies  CBC: Recent Labs  Lab 04/09/23 1512 04/09/23 1731 04/10/23 0421 04/11/23 0633  WBC 21.9*  --  20.5* 15.9*  HGB 10.9* 9.9* 9.1* 8.7*  HCT 34.3* 29.0* 28.2* 27.2*  MCV 85.1  --  85.7 87.5  PLT 429*  --  374 407*    Basic Metabolic Panel: Recent Labs  Lab 04/09/23 1512 04/09/23 1731 04/10/23 0421 04/11/23 0633  NA 132* 130* 131* 133*  K 3.9 3.4* 3.1* 5.1  CL 93* 100 96* 100  CO2 20*  --  22 20*  GLUCOSE 106* 107* 92 81  BUN 51* 52* 42* 38*  CREATININE 1.32* 1.40* 1.20* 0.95  CALCIUM 9.0  --  8.1* 8.9  MG  --   --  2.1  --   PHOS  --   --  3.0  --     GFR: Estimated Creatinine Clearance: 37.4 mL/min (by C-G formula based on SCr of 0.95 mg/dL).  Liver Function Tests: Recent Labs  Lab 04/09/23 1725  AST 58*  ALT 28  ALKPHOS 117  BILITOT 1.3*  PROT 7.0  ALBUMIN 2.7*    CBG: Recent Labs  Lab 04/09/23 1436  GLUCAP 99     Recent Results (from the past 240 hour(s))  SARS Coronavirus 2 by RT PCR (hospital order, performed in [facility] hospital lab) *cepheid single result test* Anterior Nasal Swab     Status: None   Collection Time: 04/09/23  2:35 PM   Specimen: Anterior Nasal Swab  Result Value Ref Range Status   SARS Coronavirus 2 by RT PCR NEGATIVE NEGATIVE Final    Comment: Performed at [facility], [address], [place], [place] [address]  Respiratory (~20 pathogens) panel by PCR     Status: None   Collection Time: 04/10/23  2:29 PM   Specimen: Nasopharyngeal Swab; Respiratory  Result Value Ref Range Status   Adenovirus NOT DETECTED NOT DETECTED Final   Coronavirus 229E NOT DETECTED NOT DETECTED Final    Comment: (NOTE) The  Coronavirus on the Respiratory Panel, DOES NOT test for the novel  Coronavirus (2019 nCoV)    Coronavirus HKU1 NOT DETECTED NOT DETECTED Final   Coronavirus NL63 NOT DETECTED NOT DETECTED Final   Coronavirus OC43 NOT DETECTED NOT DETECTED Final   Metapneumovirus NOT DETECTED NOT DETECTED Final   Rhinovirus / Enterovirus NOT DETECTED NOT DETECTED Final   Influenza A NOT DETECTED NOT DETECTED Final   Influenza B NOT DETECTED NOT DETECTED Final   Parainfluenza Virus 1 NOT DETECTED NOT DETECTED Final   Parainfluenza Virus 2 NOT DETECTED NOT DETECTED Final   Parainfluenza Virus 3 NOT  DETECTED NOT DETECTED Final   Parainfluenza Virus 4 NOT DETECTED NOT DETECTED Final   Respiratory Syncytial Virus NOT DETECTED NOT DETECTED Final   Bordetella pertussis NOT DETECTED NOT DETECTED Final   Bordetella Parapertussis NOT DETECTED NOT DETECTED Final   Chlamydophila pneumoniae NOT DETECTED NOT DETECTED Final   Mycoplasma pneumoniae NOT DETECTED NOT DETECTED Final    Comment: Performed at [facility], [address] [place], [place] [address]         Radiology Studies: CT Head Wo Contrast  Result Date: 04/09/2023 CLINICAL DATA:  Mental status change EXAM: CT HEAD WITHOUT CONTRAST TECHNIQUE: Contiguous axial images were obtained from the base of the skull through the vertex without intravenous contrast. RADIATION DOSE REDUCTION: This exam was performed according to the departmental dose-optimization program which includes automated exposure control, adjustment of the mA and/or kV according to patient size and/or use of iterative reconstruction technique. COMPARISON:  Head CT 02/05/2023 FINDINGS: Brain: There is no evidence for acute infarction, hemorrhage, hydrocephalus, extra-axial fluid collection or mass effect. Again seen is mild diffuse atrophy and extensive periventricular and deep white matter hypodensity, likely chronic small vessel ischemic change. Vascular: Atherosclerotic calcifications  are present within the cavernous internal carotid arteries. Skull: Normal. Negative for fracture or focal lesion. Sinuses/Orbits: No acute finding. Other: None. IMPRESSION: 1. No acute intracranial process. 2. Mild diffuse atrophy and extensive chronic small vessel ischemic change. Electronically Signed   By: Darliss Cheney M.D.   On: 04/09/2023 18:10   DG Chest 1 View  Result Date: 04/09/2023 CLINICAL DATA:  Low oxygen saturation and weakness today. Upper and lower back pain. EXAM: CHEST  1 VIEW COMPARISON:  01/11/2023 FINDINGS: Shallow inspiration. Heart size and pulmonary vascularity are normal. Developing infiltration in the lung bases bilaterally, greater on the right. This likely represents multifocal pneumonia or possibly atelectasis. Small right pleural effusion with fluid in the fissure. No pneumothorax. Calcified and tortuous aorta. Leads stimulators projecting over the midthoracic region. Old fracture deformity of the proximal right humerus. IMPRESSION: Developing infiltration or atelectasis in the lung bases bilaterally, greater on the right, likely pneumonia. Small right pleural effusion with fluid in the fissure. Electronically Signed   By: Burman Nieves M.D.   On: 04/09/2023 16:09        Scheduled Meds:  aspirin EC  81 mg Oral Q M,W,F-1800   azithromycin  500 mg Oral Daily   donepezil  5 mg Oral Q breakfast   DULoxetine  60 mg Oral Daily   heparin  5,000 Units Subcutaneous Q8H   levothyroxine  75 mcg Oral Q0600   pantoprazole  40 mg Oral BID   rosuvastatin  10 mg Oral Daily   traZODone  50 mg Oral QHS   Continuous Infusions:  cefTRIAXone (ROCEPHIN)  IV 2 g (04/10/23 1717)     LOS: 2 days        Huey Bienenstock, MD [facility]   To contact the attending provider between 7A-7P or the covering provider during after hours 7P-7A, please log into the web site [email or web address] and access using universal [place] password for that web site. If you do not have the  password, please call the hospital operator.  04/11/2023, 11:11 AM

## 2023-04-11 NOTE — Plan of Care (Signed)

## 2023-04-12 DIAGNOSIS — J189 Pneumonia, unspecified organism: Secondary | ICD-10-CM | POA: Diagnosis not present

## 2023-04-12 DIAGNOSIS — E871 Hypo-osmolality and hyponatremia: Secondary | ICD-10-CM | POA: Diagnosis not present

## 2023-04-12 LAB — CBC
HCT: 27.2 % — ABNORMAL LOW (ref 36.0–46.0)
Hemoglobin: 8.7 g/dL — ABNORMAL LOW (ref 12.0–15.0)
MCH: 28.2 pg (ref 26.0–34.0)
MCHC: 32 g/dL (ref 30.0–36.0)
MCV: 88 fL (ref 80.0–100.0)
Platelets: 469 10*3/uL — ABNORMAL HIGH (ref 150–400)
RBC: 3.09 MIL/uL — ABNORMAL LOW (ref 3.87–5.11)
RDW: 17.2 % — ABNORMAL HIGH (ref 11.5–15.5)
WBC: 13.6 10*3/uL — ABNORMAL HIGH (ref 4.0–10.5)
nRBC: 0 % (ref 0.0–0.2)

## 2023-04-12 LAB — BASIC METABOLIC PANEL
Anion gap: 13 (ref 5–15)
BUN: 27 mg/dL — ABNORMAL HIGH (ref 8–23)
CO2: 20 mmol/L — ABNORMAL LOW (ref 22–32)
Calcium: 8.7 mg/dL — ABNORMAL LOW (ref 8.9–10.3)
Chloride: 96 mmol/L — ABNORMAL LOW (ref 98–111)
Creatinine, Ser: 0.83 mg/dL (ref 0.44–1.00)
GFR, Estimated: >60 mL/min (ref >60)
Glucose, Bld: 84 mg/dL (ref 70–99)
Potassium: 4.6 mmol/L (ref 3.5–5.1)
Sodium: 129 mmol/L — ABNORMAL LOW (ref 135–145)

## 2023-04-12 LAB — CORTISOL: Cortisol, Plasma: 15.7 ug/dL

## 2023-04-12 LAB — VITAMIN B12: Vitamin B-12: 1036 pg/mL — ABNORMAL HIGH (ref 180–914)

## 2023-04-12 LAB — TSH: TSH: 1.259 u[IU]/mL (ref 0.350–4.500)

## 2023-04-12 MED ORDER — ENSURE ENLIVE PO LIQD
237.0000 mL | Freq: Two times a day (BID) | ORAL | Status: DC
  Administered 2023-04-12 – 2023-04-15 (×8): 237 mL via ORAL

## 2023-04-12 MED ORDER — SODIUM CHLORIDE 0.9 % IV SOLN: INTRAVENOUS | Status: DC

## 2023-04-12 NOTE — Progress Notes (Addendum)
PROGRESS NOTE    Samantha Fletcher  ZOX:[id number] DOB: 1946/07/24 DOA: 04/09/2023 PCP: Marylen Ponto, MD   Chief Complaint  Patient presents with   Weakness    Brief Narrative:   Samantha Fletcher is a 77 y.o. female with medical history significant for hypertension, COPD, CKD 3A, OSA with CPAP intolerance, PAF not anticoagulated, depression, anxiety, and chronic pain who presents to the emergency department for evaluation of productive cough, shortness of breath, fatigue, and loss of appetite.   Patient reports at least 5 days of progressive fatigue with worsening productive cough, shortness of breath, and loss of appetite.  She also reports some episodes of nausea with nonbloody vomiting.  She denies abdominal pain, chest pain, or diarrhea.   Upon arrival to the ED, patient is found to be afebrile and saturating upper 80s on room air with mild tachypnea, normal heart rate, and stable blood pressure.  EKG demonstrates sinus rhythm.  Head CT is negative for acute intracranial abnormality.  Chest x-ray is concerning for developing pneumonia.  Labs are most notable for sodium 132, BUN 51, creatinine 1.32, and WBC 21,900.  COVID PCR was negative.   Patient was started on supplemental oxygen in the ED, given 1 L of normal saline, and treated with Rocephin and azithromycin.     Assessment & Plan:   Principal Problem:   Pneumonia Active Problems:   Anxiety and depression   Hypothyroidism   CKD stage 3a, GFR 45-59 ml/min (HCC)   COPD (chronic obstructive pulmonary disease) (HCC)   Hypertension   Chronic pain syndrome   PAF (paroxysmal atrial fibrillation) (HCC)   Memory impairment   Acute respiratory failure with hypoxia (HCC)   Hyponatremia  Acute hypoxic respiratory failure  Pneumonia - Continue Rocephin and azithromycin,  -strep pneumonia is negative and Legionella antigen is negative as well -Resp viral panel is negative -He remains on 2 L oxygen this morning, she was again  encouraged use incentive spirometry and flutter valve, she was encouraged to ambulate and to get out of bed to chair. -Procalcitonin significantly elevated on admission, trending down.   Hx of CVA  - Continue ASA and statin      Hypertension  - Hold HCTZ in light of hypovolemia and hyponatremia  -Started on Norvasc yesterday, discontinued this morning given soft blood pressure -Continue with as needed pain meds for now   CKD IIIa  - SCr is 1.32 on admission, appears close to baseline based on labs in Care Everywhere  - Renally-dose medications, monitor    PAF  - Had an isolated episode in June 2023, declined anticoagulation when offered by cardiology   - Continue aspirin, monitor   Hypothyroidism  - Continue Synthroid    COPD  - Not in exacerbation on admission  - Continue inhalers     Memory impairment  - Continue Aricept, use delirium precautions   Weakness/deconditioning -Continue with PT/OT  Hyponatremia -Is worsening today at 129, will start on NS, will encourage to drink Ensure, and put on fluid restrictions.  Hypokalemia  - repleted  Generalized weakness/ fatigue -Will check B12, folic acid, TSH and cortisol level  DVT prophylaxis: Heparin Code Status: Full Family Communication: none at bedside Disposition: she will need SNF placement.  Status is: Inpatient    Consultants:  none   Subjective:  Report generalized weakness, fatigue and cough and  Objective: Vitals:   04/12/23 0000 04/12/23 0337 04/12/23 0400 04/12/23 0753  BP: (!) 133/51  (!) 144/54 122/77  Pulse:    60  Resp:    18  Temp: 98.1 F (36.7 C)  98 F (36.7 C) 98.1 F (36.7 C)  TempSrc: Oral  Oral Oral  SpO2:    98%  Weight:  45.3 kg    Height:        Intake/Output Summary (Last 24 hours) at 04/12/2023 1202 Last data filed at 04/12/2023 0300 Gross per 24 hour  Intake 440 ml  Output --  Net 440 ml   Filed Weights   04/09/23 1422 04/10/23 0404 04/12/23 0337  Weight: 45.4  kg 45.6 kg 45.3 kg    Examination:  Awake Alert, extremely frail, deconditioned Symmetrical Chest wall movement, Good air movement bilaterally, Rales at the bases RRR,No Gallops,Rubs or new Murmurs, No Parasternal Heave +ve B.Sounds, Abd Soft, No tenderness, No rebound - guarding or rigidity. No Cyanosis, Clubbing or edema, No new Rash or bruise       Data Reviewed: I have personally reviewed following labs and imaging studies  CBC: Recent Labs  Lab 04/09/23 1512 04/09/23 1731 04/10/23 0421 04/11/23 0633 04/12/23 0344  WBC 21.9*  --  20.5* 15.9* 13.6*  HGB 10.9* 9.9* 9.1* 8.7* 8.7*  HCT 34.3* 29.0* 28.2* 27.2* 27.2*  MCV 85.1  --  85.7 87.5 88.0  PLT 429*  --  374 407* 469*    Basic Metabolic Panel: Recent Labs  Lab 04/09/23 1512 04/09/23 1731 04/10/23 0421 04/11/23 0633 04/12/23 0344  NA 132* 130* 131* 133* 129*  K 3.9 3.4* 3.1* 5.1 4.6  CL 93* 100 96* 100 96*  CO2 20*  --  22 20* 20*  GLUCOSE 106* 107* 92 81 84  BUN 51* 52* 42* 38* 27*  CREATININE 1.32* 1.40* 1.20* 0.95 0.83  CALCIUM 9.0  --  8.1* 8.9 8.7*  MG  --   --  2.1  --   --   PHOS  --   --  3.0  --   --     GFR: Estimated Creatinine Clearance: 42.5 mL/min (by C-G formula based on SCr of 0.83 mg/dL).  Liver Function Tests: Recent Labs  Lab 04/09/23 1725  AST 58*  ALT 28  ALKPHOS 117  BILITOT 1.3*  PROT 7.0  ALBUMIN 2.7*    CBG: Recent Labs  Lab 04/09/23 1436  GLUCAP 99     Recent Results (from the past 240 hour(s))  SARS Coronavirus 2 by RT PCR (hospital order, performed in [facility] hospital lab) *cepheid single result test* Anterior Nasal Swab     Status: None   Collection Time: 04/09/23  2:35 PM   Specimen: Anterior Nasal Swab  Result Value Ref Range Status   SARS Coronavirus 2 by RT PCR NEGATIVE NEGATIVE Final    Comment: Performed at [facility] Lab, [address] [place], [place] [address]  Respiratory (~20 pathogens) panel by PCR     Status: None   Collection  Time: 04/10/23  2:29 PM   Specimen: Nasopharyngeal Swab; Respiratory  Result Value Ref Range Status   Adenovirus NOT DETECTED NOT DETECTED Final   Coronavirus 229E NOT DETECTED NOT DETECTED Final    Comment: (NOTE) The Coronavirus on the Respiratory Panel, DOES NOT test for the novel  Coronavirus (2019 nCoV)    Coronavirus HKU1 NOT DETECTED NOT DETECTED Final   Coronavirus NL63 NOT DETECTED NOT DETECTED Final   Coronavirus OC43 NOT DETECTED NOT DETECTED Final   Metapneumovirus NOT DETECTED NOT DETECTED Final   Rhinovirus / Enterovirus NOT  DETECTED NOT DETECTED Final   Influenza A NOT DETECTED NOT DETECTED Final   Influenza B NOT DETECTED NOT DETECTED Final   Parainfluenza Virus 1 NOT DETECTED NOT DETECTED Final   Parainfluenza Virus 2 NOT DETECTED NOT DETECTED Final   Parainfluenza Virus 3 NOT DETECTED NOT DETECTED Final   Parainfluenza Virus 4 NOT DETECTED NOT DETECTED Final   Respiratory Syncytial Virus NOT DETECTED NOT DETECTED Final   Bordetella pertussis NOT DETECTED NOT DETECTED Final   Bordetella Parapertussis NOT DETECTED NOT DETECTED Final   Chlamydophila pneumoniae NOT DETECTED NOT DETECTED Final   Mycoplasma pneumoniae NOT DETECTED NOT DETECTED Final    Comment: Performed at [facility] Lab, [address] [place], [place] [address]         Radiology Studies: No results found.      Scheduled Meds:  aspirin EC  81 mg Oral Q M,W,F-1800   azithromycin  500 mg Oral Daily   donepezil  5 mg Oral Q breakfast   DULoxetine  60 mg Oral Daily   feeding supplement  237 mL Oral BID BM   heparin  5,000 Units Subcutaneous Q8H   levothyroxine  75 mcg Oral Q0600   pantoprazole  40 mg Oral BID   rosuvastatin  10 mg Oral Daily   traZODone  50 mg Oral QHS   Continuous Infusions:  sodium chloride 50 mL/hr at 04/12/23 0831   cefTRIAXone (ROCEPHIN)  IV 2 g (04/11/23 1803)     LOS: 3 days        Huey Bienenstock, MD [facility]   To contact the  attending provider between 7A-7P or the covering provider during after hours 7P-7A, please log into the web site [email or web address] and access using universal [place] password for that web site. If you do not have the password, please call the hospital operator.  04/12/2023, 12:02 PM

## 2023-04-12 NOTE — Plan of Care (Signed)
  Problem: Health Behavior/Discharge Planning: Goal: Ability to manage health-related needs will improve Outcome: Progressing   Problem: Nutrition: Goal: Adequate nutrition will be maintained Outcome: Progressing   Problem: Pain Managment: Goal: General experience of comfort will improve Outcome: Progressing   

## 2023-04-13 DIAGNOSIS — E871 Hypo-osmolality and hyponatremia: Secondary | ICD-10-CM | POA: Diagnosis not present

## 2023-04-13 DIAGNOSIS — J189 Pneumonia, unspecified organism: Secondary | ICD-10-CM | POA: Diagnosis not present

## 2023-04-13 LAB — CBC
HCT: 26.4 % — ABNORMAL LOW (ref 36.0–46.0)
Hemoglobin: 8.3 g/dL — ABNORMAL LOW (ref 12.0–15.0)
MCH: 27.3 pg (ref 26.0–34.0)
MCHC: 31.4 g/dL (ref 30.0–36.0)
MCV: 86.8 fL (ref 80.0–100.0)
Platelets: 503 10*3/uL — ABNORMAL HIGH (ref 150–400)
RBC: 3.04 MIL/uL — ABNORMAL LOW (ref 3.87–5.11)
RDW: 16.9 % — ABNORMAL HIGH (ref 11.5–15.5)
WBC: 13.5 10*3/uL — ABNORMAL HIGH (ref 4.0–10.5)
nRBC: 0 % (ref 0.0–0.2)

## 2023-04-13 LAB — BASIC METABOLIC PANEL
Anion gap: 12 (ref 5–15)
BUN: 19 mg/dL (ref 8–23)
CO2: 19 mmol/L — ABNORMAL LOW (ref 22–32)
Calcium: 8.5 mg/dL — ABNORMAL LOW (ref 8.9–10.3)
Chloride: 97 mmol/L — ABNORMAL LOW (ref 98–111)
Creatinine, Ser: 0.67 mg/dL (ref 0.44–1.00)
GFR, Estimated: >60 mL/min (ref >60)
Glucose, Bld: 93 mg/dL (ref 70–99)
Potassium: 4.1 mmol/L (ref 3.5–5.1)
Sodium: 128 mmol/L — ABNORMAL LOW (ref 135–145)

## 2023-04-13 LAB — PHOSPHORUS: Phosphorus: 3.9 mg/dL (ref 2.5–4.6)

## 2023-04-13 LAB — MAGNESIUM: Magnesium: 1.6 mg/dL — ABNORMAL LOW (ref 1.7–2.4)

## 2023-04-13 MED ORDER — SODIUM CHLORIDE 1 G PO TABS
1.0000 g | ORAL_TABLET | Freq: Two times a day (BID) | ORAL | Status: DC
  Administered 2023-04-13 – 2023-04-15 (×6): 1 g via ORAL
  Filled 2023-04-13 (×6): qty 1

## 2023-04-13 MED ORDER — MAGNESIUM SULFATE 4 GM/100ML IV SOLN
4.0000 g | Freq: Once | INTRAVENOUS | Status: AC
  Administered 2023-04-13: 4 g via INTRAVENOUS
  Filled 2023-04-13: qty 100

## 2023-04-13 MED ORDER — LOSARTAN POTASSIUM 50 MG PO TABS
100.0000 mg | ORAL_TABLET | Freq: Every day | ORAL | Status: DC
  Administered 2023-04-13 – 2023-04-15 (×3): 100 mg via ORAL
  Filled 2023-04-13 (×3): qty 2

## 2023-04-13 NOTE — Progress Notes (Signed)
PROGRESS NOTE    Samantha Fletcher  ZOX:[id number] DOB: 1946/08/11 DOA: 04/09/2023 PCP: Marylen Ponto, MD   Chief Complaint  Patient presents with   Weakness    Brief Narrative:   Samantha Fletcher is a 77 y.o. female with medical history significant for hypertension, COPD, CKD 3A, OSA with CPAP intolerance, PAF not anticoagulated, depression, anxiety, and chronic pain who presents to the emergency department for evaluation of productive cough, shortness of breath, fatigue, and loss of appetite.   Patient reports at least 5 days of progressive fatigue with worsening productive cough, shortness of breath, and loss of appetite.  She also reports some episodes of nausea with nonbloody vomiting.  She denies abdominal pain, chest pain, or diarrhea.   Upon arrival to the ED, patient is found to be afebrile and saturating upper 80s on room air with mild tachypnea, normal heart rate, and stable blood pressure.  EKG demonstrates sinus rhythm.  Head CT is negative for acute intracranial abnormality.  Chest x-ray is concerning for developing pneumonia.  Labs are most notable for sodium 132, BUN 51, creatinine 1.32, and WBC 21,900.  COVID PCR was negative.   Patient was started on supplemental oxygen in the ED, given 1 L of normal saline, and treated with Rocephin and azithromycin.     Assessment & Plan:   Principal Problem:   Pneumonia Active Problems:   Anxiety and depression   Hypothyroidism   CKD stage 3a, GFR 45-59 ml/min (HCC)   COPD (chronic obstructive pulmonary disease) (HCC)   Hypertension   Chronic pain syndrome   PAF (paroxysmal atrial fibrillation) (HCC)   Memory impairment   Acute respiratory failure with hypoxia (HCC)   Hyponatremia  Acute hypoxic respiratory failure  Sepsis(POA) due to pneumonia - Continue Rocephin and azithromycin,  -strep pneumonia is negative and Legionella antigen is negative as well -Resp viral panel is negative -He remains on 2 L oxygen this morning,  she was again encouraged use incentive spirometry and flutter valve, she was encouraged to ambulate and to get out of bed to chair. -Significant leukocytosis, and significantly elevated blood cultures on admission, this has been trending down and improving gradually.   Hx of CVA  - Continue ASA and statin    Hyponatremia -Continue to worsen, it is 128 today, continue with IV NS, she was encouraged to drink Ensure, and she remains a fluid restriction, will add salt tablets, monitor BMP closely.      Hypertension  - Hold HCTZ in light of hypovolemia and hyponatremia  -Started on Norvasc, has been later discontinued due to low blood pressure, -Continue with as needed pain meds for now   CKD IIIa  - SCr is 1.32 on admission, appears close to baseline based on labs in Care Everywhere  - Renally-dose medications, monitor    PAF  - Had an isolated episode in June 2023, declined anticoagulation when offered by cardiology   - Continue aspirin, monitor   Hypothyroidism  - Continue Synthroid    COPD  - Not in exacerbation on admission  - Continue inhalers     Memory impairment  - Continue Aricept, use delirium precautions   Weakness/deconditioning -Continue with PT/OT  Hypokalemia  - repleted  Generalized weakness/ fatigue -Will check B12, folic acid, TSH and cortisol level  DVT prophylaxis: Heparin Code Status: Full Family Communication: none at bedside Disposition: she will need SNF placement.  Status is: Inpatient    Consultants:  none   Subjective:  Reports cough  has improved, less productive today, still reports generalized weakness and fatigue Objective: Vitals:   04/12/23 2000 04/13/23 0000 04/13/23 0400 04/13/23 0830  BP: (!) 140/60 (!) 146/69 (!) 156/65   Pulse:  85 80   Resp:  16 16 19   Temp:  98 F (36.7 C) 98.4 F (36.9 C)   TempSrc:  Oral Oral   SpO2:  98% 96%   Weight:      Height:       No intake or output data in the 24 hours ending 04/13/23  1200  Filed Weights   04/09/23 1422 04/10/23 0404 04/12/23 0337  Weight: 45.4 kg 45.6 kg 45.3 kg    Examination:  Awake Alert, extremely frail, deconditioned Symmetrical Chest wall movement, Good air movement bilaterally, Rales at the bases RRR,No Gallops,Rubs or new Murmurs, No Parasternal Heave +ve B.Sounds, Abd Soft, No tenderness, No rebound - guarding or rigidity. No Cyanosis, Clubbing or edema, No new Rash or bruise       Data Reviewed: I have personally reviewed following labs and imaging studies  CBC: Recent Labs  Lab 04/09/23 1512 04/09/23 1731 04/10/23 0421 04/11/23 0633 04/12/23 0344 04/13/23 0353  WBC 21.9*  --  20.5* 15.9* 13.6* 13.5*  HGB 10.9* 9.9* 9.1* 8.7* 8.7* 8.3*  HCT 34.3* 29.0* 28.2* 27.2* 27.2* 26.4*  MCV 85.1  --  85.7 87.5 88.0 86.8  PLT 429*  --  374 407* 469* 503*    Basic Metabolic Panel: Recent Labs  Lab 04/09/23 1512 04/09/23 1731 04/10/23 0421 04/11/23 0633 04/12/23 0344 04/13/23 0353  NA 132* 130* 131* 133* 129* 128*  K 3.9 3.4* 3.1* 5.1 4.6 4.1  CL 93* 100 96* 100 96* 97*  CO2 20*  --  22 20* 20* 19*  GLUCOSE 106* 107* 92 81 84 93  BUN 51* 52* 42* 38* 27* 19  CREATININE 1.32* 1.40* 1.20* 0.95 0.83 0.67  CALCIUM 9.0  --  8.1* 8.9 8.7* 8.5*  MG  --   --  2.1  --   --  1.6*  PHOS  --   --  3.0  --   --  3.9    GFR: Estimated Creatinine Clearance: 44.1 mL/min (by C-G formula based on SCr of 0.67 mg/dL).  Liver Function Tests: Recent Labs  Lab 04/09/23 1725  AST 58*  ALT 28  ALKPHOS 117  BILITOT 1.3*  PROT 7.0  ALBUMIN 2.7*    CBG: Recent Labs  Lab 04/09/23 1436  GLUCAP 99     Recent Results (from the past 240 hour(s))  SARS Coronavirus 2 by RT PCR (hospital order, performed in [facility] hospital lab) *cepheid single result test* Anterior Nasal Swab     Status: None   Collection Time: 04/09/23  2:35 PM   Specimen: Anterior Nasal Swab  Result Value Ref Range Status   SARS Coronavirus 2 by RT PCR NEGATIVE  NEGATIVE Final    Comment: Performed at [facility] Lab, [address] [place], [place] [address]  Respiratory (~20 pathogens) panel by PCR     Status: None   Collection Time: 04/10/23  2:29 PM   Specimen: Nasopharyngeal Swab; Respiratory  Result Value Ref Range Status   Adenovirus NOT DETECTED NOT DETECTED Final   Coronavirus 229E NOT DETECTED NOT DETECTED Final    Comment: (NOTE) The Coronavirus on the Respiratory Panel, DOES NOT test for the novel  Coronavirus (2019 nCoV)    Coronavirus HKU1 NOT DETECTED NOT DETECTED Final   Coronavirus  NL63 NOT DETECTED NOT DETECTED Final   Coronavirus OC43 NOT DETECTED NOT DETECTED Final   Metapneumovirus NOT DETECTED NOT DETECTED Final   Rhinovirus / Enterovirus NOT DETECTED NOT DETECTED Final   Influenza A NOT DETECTED NOT DETECTED Final   Influenza B NOT DETECTED NOT DETECTED Final   Parainfluenza Virus 1 NOT DETECTED NOT DETECTED Final   Parainfluenza Virus 2 NOT DETECTED NOT DETECTED Final   Parainfluenza Virus 3 NOT DETECTED NOT DETECTED Final   Parainfluenza Virus 4 NOT DETECTED NOT DETECTED Final   Respiratory Syncytial Virus NOT DETECTED NOT DETECTED Final   Bordetella pertussis NOT DETECTED NOT DETECTED Final   Bordetella Parapertussis NOT DETECTED NOT DETECTED Final   Chlamydophila pneumoniae NOT DETECTED NOT DETECTED Final   Mycoplasma pneumoniae NOT DETECTED NOT DETECTED Final    Comment: Performed at [facility] Lab, [address], [place], [place] [address]         Radiology Studies: No results found.      Scheduled Meds:  aspirin EC  81 mg Oral Q M,W,F-1800   azithromycin  500 mg Oral Daily   donepezil  5 mg Oral Q breakfast   DULoxetine  60 mg Oral Daily   feeding supplement  237 mL Oral BID BM   heparin  5,000 Units Subcutaneous Q8H   levothyroxine  75 mcg Oral Q0600   losartan  100 mg Oral Daily   pantoprazole  40 mg Oral BID   rosuvastatin  10 mg Oral Daily   sodium chloride  1 g Oral BID WC    traZODone  50 mg Oral QHS   Continuous Infusions:  sodium chloride 50 mL/hr at 04/12/23 0831   cefTRIAXone (ROCEPHIN)  IV 2 g (04/12/23 1656)     LOS: 4 days        Huey Bienenstock, MD [facility]   To contact the attending provider between 7A-7P or the covering provider during after hours 7P-7A, please log into the web site [email or web address] and access using universal [place] password for that web site. If you do not have the password, please call the hospital operator.  04/13/2023, 12:00 PM

## 2023-04-13 NOTE — TOC Progression Note (Signed)
Transition of Care [facility]) - Progression Note    Patient Details  Name: CLOIE KITCHEN MRN: [id number] Date of Birth: 04-29-1946  [facility] CM/SW Contact  Donnalee Curry, LCSWA Phone Number: 04/13/2023, 12:59 PM  Clinical Narrative:     Per MD, pt agreeable to SNF.   SW met with pt at bedside. Pt agreeable to send out to area SNF's. Pt reports no hx of SNF but notes indicate hx at [facility] and [facility]. SW explained need for insurance auth. Pt verbalized understanding.  Expected Discharge Plan: Home w Home Health Services Barriers to Discharge: Continued Medical Work up  Expected Discharge Plan and Services In-house Referral: Clinical Social Work Discharge Planning Services: CM Consult Post Acute Care Choice: Home Health Living arrangements for the past 2 months: Single Family Home                                       Social Determinants of Health (SDOH) Interventions SDOH Screenings   Food Insecurity: No Food Insecurity (04/09/2023)  Housing: Low Risk  (04/09/2023)  Transportation Needs: No Transportation Needs (04/09/2023)  Utilities: Not At Risk (04/09/2023)  Tobacco Use: Medium Risk (04/09/2023)    Readmission Risk Interventions     No data to display
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
## 2023-04-14 DIAGNOSIS — J189 Pneumonia, unspecified organism: Secondary | ICD-10-CM | POA: Diagnosis not present

## 2023-04-14 LAB — BASIC METABOLIC PANEL
Anion gap: 11 (ref 5–15)
BUN: 13 mg/dL (ref 8–23)
CO2: 21 mmol/L — ABNORMAL LOW (ref 22–32)
Calcium: 8.4 mg/dL — ABNORMAL LOW (ref 8.9–10.3)
Chloride: 97 mmol/L — ABNORMAL LOW (ref 98–111)
Creatinine, Ser: 0.77 mg/dL (ref 0.44–1.00)
GFR, Estimated: >60 mL/min (ref >60)
Glucose, Bld: 90 mg/dL (ref 70–99)
Potassium: 3.9 mmol/L (ref 3.5–5.1)
Sodium: 129 mmol/L — ABNORMAL LOW (ref 135–145)

## 2023-04-14 MED ORDER — AMLODIPINE BESYLATE 5 MG PO TABS
5.0000 mg | ORAL_TABLET | Freq: Every day | ORAL | Status: DC
  Administered 2023-04-14 – 2023-04-15 (×2): 5 mg via ORAL
  Filled 2023-04-14 (×2): qty 1

## 2023-04-14 NOTE — Plan of Care (Signed)

## 2023-04-14 NOTE — TOC Progression Note (Addendum)
Transition of Care [facility]) - Progression Note    Patient Details  Name: Samantha Fletcher MRN: [id number] Date of Birth: 1946-11-13  Transition of Care [facility]) CM/SW Contact  Mearl Latin, LCSW Phone Number: 04/14/2023, 9:21 AM  Clinical Narrative:    9:21am-CSW requested [facility] and  rehab review patient. No beds at [facility].    12pm-CSW presented bed offers to the patient. She requested CSW contact her sister to discuss options and then let her know so she can approve plan.  1pm-CSW spoke with patient's sister and she has selected [facility] due to the ratings. CSW will begin insurance authorization once updated therapy notes are in.  3pm-CSW met with patient and she is in agreement with [facility] since her sister said so. CSW initiated insurance authorization process, Ref# [id number].   5:30pm-Insurance approval received for [facility], effective 04/15/2023-04/17/2023.   Expected Discharge Plan: Skilled Nursing Facility Barriers to Discharge: Continued Medical Work up, SNF Pending bed offer, English as a second language teacher  Expected Discharge Plan and Services In-house Referral: Clinical Social Work Discharge Planning Services: CM Consult Post Acute Care Choice: Skilled Nursing Facility Living arrangements for the past 2 months: Single Family Home                                       Social Determinants of Health (SDOH) Interventions SDOH Screenings   Food Insecurity: No Food Insecurity (04/09/2023)  Housing: Low Risk  (04/09/2023)  Transportation Needs: No Transportation Needs (04/09/2023)  Utilities: Not At Risk (04/09/2023)  Tobacco Use: Medium Risk (04/09/2023)    Readmission Risk Interventions     No data to display

## 2023-04-14 NOTE — Care Management Important Message (Signed)
Important Message  Patient Details  Name: Samantha Fletcher MRN: [id number] Date of Birth: 1945-12-09   Medicare Important Message Given:  Yes     Mardene Sayer 04/14/2023, 2:05 PM

## 2023-04-14 NOTE — Progress Notes (Signed)
PROGRESS NOTE    Samantha Fletcher  ZOX:[id number] DOB: [birth year] DOA: 04/09/2023 PCP: Marylen Ponto, MD   Chief Complaint  Patient presents with   Weakness    Brief Narrative:   Samantha Fletcher is a 77 y.o. female with medical history significant for hypertension, COPD, CKD 3A, OSA with CPAP intolerance, PAF not anticoagulated, depression, anxiety, and chronic pain who presents to the emergency department for evaluation of productive cough, shortness of breath, fatigue, and loss of appetite.   Patient reports at least 5 days of progressive fatigue with worsening productive cough, shortness of breath, and loss of appetite.  She also reports some episodes of nausea with nonbloody vomiting.  She denies abdominal pain, chest pain, or diarrhea.   Upon arrival to the ED, patient is found to be afebrile and saturating upper 80s on room air with mild tachypnea, normal heart rate, and stable blood pressure.  EKG demonstrates sinus rhythm.  Head CT is negative for acute intracranial abnormality.  Chest x-ray is concerning for developing pneumonia.  Labs are most notable for sodium 132, BUN 51, creatinine 1.32, and WBC 21,900.  COVID PCR was negative.   Patient was started on supplemental oxygen in the ED, given 1 L of normal saline, and treated with Rocephin and azithromycin.     Assessment & Plan:   Principal Problem:   Pneumonia Active Problems:   Anxiety and depression   Hypothyroidism   CKD stage 3a, GFR 45-59 ml/min (HCC)   COPD (chronic obstructive pulmonary disease) (HCC)   Hypertension   Chronic pain syndrome   PAF (paroxysmal atrial fibrillation) (HCC)   Memory impairment   Acute respiratory failure with hypoxia (HCC)   Hyponatremia  Acute hypoxic respiratory failure  Sepsis(POA) due to pneumonia - treated Rocephin and azithromycin,  -strep pneumonia is negative and Legionella antigen is negative as well -Resp viral panel is negative -hypoxia much improved, she is on room  air , she was again encouraged to use incentive spirometry and flutter valve, she was encouraged to ambulate and to get out of bed to chair. -Significant leukocytosis, and significantly elevated procalcitonin on admission, this has been trending down and improving gradually.   Hx of CVA  - Continue ASA and statin    Hyponatremia -Continue to worsen, it is 128 today, continue with IV NS, she was encouraged to drink Ensure, and she remains a fluid restriction, will add salt tablets, monitor BMP closely.      Hypertension  - Hold HCTZ in light of hypovolemia and hyponatremia  -Started on Norvasc, has been later discontinued due to low blood pressure, -Continue with as needed pain meds for now   CKD IIIa  - SCr is 1.32 on admission, appears close to baseline based on labs in Care Everywhere  - Renally-dose medications, monitor    PAF  - Had an isolated episode in June 2023, declined anticoagulation when offered by cardiology   - Continue aspirin, monitor   Hypothyroidism  - Continue Synthroid    COPD  - Not in exacerbation on admission  - Continue inhalers     Memory impairment  - Continue Aricept, use delirium precautions   Weakness/deconditioning -Continue with PT/OT  Hypokalemia  - repleted  Generalized weakness/ fatigue -B12, folic acid, cortisol and TSH with no significant abnormalities.    DVT prophylaxis: Heparin Code Status: Full Family Communication: none at bedside Disposition: she will need SNF placement.  Status is: Inpatient    Consultants:  none  Subjective:  Cough and dyspnea has improved  Objective: Vitals:   04/13/23 2331 04/14/23 0500 04/14/23 0527 04/14/23 0748  BP: (!) 160/67  (!) 158/65 (!) 178/66  Pulse: 61  63 61  Resp: 16  18 18   Temp: 98.4 F (36.9 C)  97.9 F (36.6 C) 98.6 F (37 C)  TempSrc: Oral  Oral   SpO2: 93%  93% 94%  Weight:  45.6 kg    Height:        Intake/Output Summary (Last 24 hours) at 04/14/2023 1056 Last  data filed at 04/14/2023 0529 Gross per 24 hour  Intake 1014 ml  Output 1200 ml  Net -186 ml    Filed Weights   04/10/23 0404 04/12/23 0337 04/14/23 0500  Weight: 45.6 kg 45.3 kg 45.6 kg    Examination:  Awake Alert, Oriented X 3, frail, deconditioned Symmetrical Chest wall movement, Good air movement bilaterally, CTAB RRR,No Gallops,Rubs or new Murmurs, No Parasternal Heave +ve B.Sounds, Abd Soft, No tenderness, No rebound - guarding or rigidity. No Cyanosis, Clubbing or edema, No new Rash or bruise       Data Reviewed: I have personally reviewed following labs and imaging studies  CBC: Recent Labs  Lab 04/09/23 1512 04/09/23 1731 04/10/23 0421 04/11/23 0633 04/12/23 0344 04/13/23 0353  WBC 21.9*  --  20.5* 15.9* 13.6* 13.5*  HGB 10.9* 9.9* 9.1* 8.7* 8.7* 8.3*  HCT 34.3* 29.0* 28.2* 27.2* 27.2* 26.4*  MCV 85.1  --  85.7 87.5 88.0 86.8  PLT 429*  --  374 407* 469* 503*    Basic Metabolic Panel: Recent Labs  Lab 04/10/23 0421 04/11/23 0633 04/12/23 0344 04/13/23 0353 04/14/23 0447  NA 131* 133* 129* 128* 129*  K 3.1* 5.1 4.6 4.1 3.9  CL 96* 100 96* 97* 97*  CO2 22 20* 20* 19* 21*  GLUCOSE 92 81 84 93 90  BUN 42* 38* 27* 19 13  CREATININE 1.20* 0.95 0.83 0.67 0.77  CALCIUM 8.1* 8.9 8.7* 8.5* 8.4*  MG 2.1  --   --  1.6*  --   PHOS 3.0  --   --  3.9  --     GFR: Estimated Creatinine Clearance: 44.4 mL/min (by C-G formula based on SCr of 0.77 mg/dL).  Liver Function Tests: Recent Labs  Lab 04/09/23 1725  AST 58*  ALT 28  ALKPHOS 117  BILITOT 1.3*  PROT 7.0  ALBUMIN 2.7*    CBG: Recent Labs  Lab 04/09/23 1436  GLUCAP 99     Recent Results (from the past 240 hour(s))  SARS Coronavirus 2 by RT PCR (hospital order, performed in [facility] hospital lab) *cepheid single result test* Anterior Nasal Swab     Status: None   Collection Time: 04/09/23  2:35 PM   Specimen: Anterior Nasal Swab  Result Value Ref Range Status   SARS Coronavirus 2  by RT PCR NEGATIVE NEGATIVE Final    Comment: Performed at [facility], [address], [place], [place] [address]  Respiratory (~20 pathogens) panel by PCR     Status: None   Collection Time: 04/10/23  2:29 PM   Specimen: Nasopharyngeal Swab; Respiratory  Result Value Ref Range Status   Adenovirus NOT DETECTED NOT DETECTED Final   Coronavirus 229E NOT DETECTED NOT DETECTED Final    Comment: (NOTE) The Coronavirus on the Respiratory Panel, DOES NOT test for the novel  Coronavirus (2019 nCoV)    Coronavirus HKU1 NOT DETECTED NOT DETECTED Final  Coronavirus NL63 NOT DETECTED NOT DETECTED Final   Coronavirus OC43 NOT DETECTED NOT DETECTED Final   Metapneumovirus NOT DETECTED NOT DETECTED Final   Rhinovirus / Enterovirus NOT DETECTED NOT DETECTED Final   Influenza A NOT DETECTED NOT DETECTED Final   Influenza B NOT DETECTED NOT DETECTED Final   Parainfluenza Virus 1 NOT DETECTED NOT DETECTED Final   Parainfluenza Virus 2 NOT DETECTED NOT DETECTED Final   Parainfluenza Virus 3 NOT DETECTED NOT DETECTED Final   Parainfluenza Virus 4 NOT DETECTED NOT DETECTED Final   Respiratory Syncytial Virus NOT DETECTED NOT DETECTED Final   Bordetella pertussis NOT DETECTED NOT DETECTED Final   Bordetella Parapertussis NOT DETECTED NOT DETECTED Final   Chlamydophila pneumoniae NOT DETECTED NOT DETECTED Final   Mycoplasma pneumoniae NOT DETECTED NOT DETECTED Final    Comment: Performed at [facility], [address] [place], [place] [address]         Radiology Studies: No results found.      Scheduled Meds:  amLODipine  5 mg Oral Daily   aspirin EC  81 mg Oral Q M,W,F-1800   donepezil  5 mg Oral Q breakfast   DULoxetine  60 mg Oral Daily   feeding supplement  237 mL Oral BID BM   heparin  5,000 Units Subcutaneous Q8H   levothyroxine  75 mcg Oral Q0600   losartan  100 mg Oral Daily   pantoprazole  40 mg Oral BID   rosuvastatin  10 mg Oral Daily   sodium chloride  1 g  Oral BID WC   traZODone  50 mg Oral QHS   Continuous Infusions:  sodium chloride 50 mL/hr at 04/13/23 1237     LOS: 5 days        Huey Bienenstock, MD [facility]   To contact the attending provider between 7A-7P or the covering provider during after hours 7P-7A, please log into the web site [email or web address] and access using universal [place] password for that web site. If you do not have the password, please call the hospital operator.  04/14/2023, 10:56 AM

## 2023-04-14 NOTE — Progress Notes (Signed)
Physical Therapy Treatment Patient Details Name: Samantha Fletcher MRN: [id number] DOB: 07-02-1946 Today's Date: 04/14/2023   History of Present Illness 77 yo female with onset of cough and fatigue, SOB and loss of appetite was admitted 5/15. Dx with acute respiratory failure, PNA, and O2 use. Replenished Na+ and K+. PMHx:  anxiety, depression, memory changes, COPD, CKD3a, hypothyroidism, HTN, chronic pain, PAF, hyponatremia, CVA, weakness    PT Comments    Pt with c/o headache and dizziness. Pt worked up for BPPV as pt stated room spinning however not nystagmus noted t/o diagnostic testing and pt reports that the spinning sensation never goes away. This limited OOB mobility this date as pt reports onset of nausea once up at EOB and returned herself to supine in bed. Pt demonstrating delayed processing, poor report of symptoms, impaired memory, impaired sequencing requiring max verbal and tactile directional cues to complete transfers in addition to min/modA. Pt also with high risk of falling with demonstration of impaired balance while upright. Pt to benefit from short term rehab program <3 hours a day to address above deficits and achieve safe level of function prior to transition home.     Recommendations for follow up therapy are one component of a multi-disciplinary discharge planning process, led by the attending physician.  Recommendations may be updated based on patient status, additional functional criteria and insurance authorization.  Follow Up Recommendations  Can patient physically be transported by private vehicle: Yes    Assistance Recommended at Discharge Frequent or constant Supervision/Assistance  Patient can return home with the following Assistance with cooking/housework;Assist for transportation;Help with stairs or ramp for entrance;A lot of help with walking and/or transfers;A lot of help with bathing/dressing/bathroom   Equipment Recommendations  None recommended by PT     Recommendations for Other Services       Precautions / Restrictions Precautions Precautions: Fall Restrictions Weight Bearing Restrictions: No     Mobility  Bed Mobility Overal bed mobility: Needs Assistance Bed Mobility: Sit to Supine, Rolling, Sidelying to Sit Rolling: Min assist Sidelying to sit: Mod assist   Sit to supine: Min assist   General bed mobility comments: max directional verbal and tactile cues, minA to complete log roll, modA for trunk elevation, minA for safety to return to supine    Transfers Overall transfer level: Needs assistance Equipment used: 1 person hand held assist Transfers: Sit to/from Stand, Bed to chair/wheelchair/BSC Sit to Stand: Min assist                Ambulation/Gait Ambulation/Gait assistance: Mod assist Gait Distance (Feet):  (side steps along edge of bed) Assistive device: Rolling walker (2 wheels), 1 person hand held assist   Gait velocity: reduced     General Gait Details: pt anxious and reporting onset of nausea, limited to side stepping along EOB to [facility] and pt quickly returning to supine in bed. Pt with report "my head, it just feels so funny."   Stairs             Wheelchair Mobility    Modified Rankin (Stroke Patients Only)       Balance Overall balance assessment: Needs assistance Sitting-balance support: Feet supported Sitting balance-Leahy Scale: Fair     Standing balance support: Bilateral upper extremity supported, During functional activity Standing balance-Leahy Scale: Poor Standing balance comment: pt unable to tolerate standign  Cognition Arousal/Alertness: Awake/alert Behavior During Therapy: WFL for tasks assessed/performed Overall Cognitive Status: No family/caregiver present to determine baseline cognitive functioning                                 General Comments: pt with inconsistent report of PLOF, symptoms, pain level and  location. Pt anxious regarding not feeling well and getting up        Exercises      General Comments General comments (skin integrity, edema, etc.): Pt reports of room spinning anytime she moves. PT assessed for horizontal and posterior canal BPPV however no nystagmus noted with dix hallpike or horizontal head roll test. Pt reports "dizziness" however states it's there all the time and never dissipates. Pt reports worse when upright vs laying down. Pt with noted sacadic movements beating to the R when tracking PTs finger with eyes only      Pertinent Vitals/Pain Pain Assessment Pain Assessment: Faces Faces Pain Scale: Hurts even more Pain Location: generalized Pain Descriptors / Indicators: Sore, Aching, Headache Pain Intervention(s): Limited activity within patient's tolerance    Home Living                          Prior Function            PT Goals (current goals can now be found in the care plan section) Acute Rehab PT Goals PT Goal Formulation: With patient Time For Goal Achievement: 04/18/23 Potential to Achieve Goals: Good Progress towards PT goals: Not progressing toward goals - comment (due to not feeling well)    Frequency    Min 2X/week      PT Plan Discharge plan needs to be updated;Frequency needs to be updated    Co-evaluation              AM-PAC PT "6 Clicks" Mobility   Outcome Measure  Help needed turning from your back to your side while in a flat bed without using bedrails?: A Little Help needed moving from lying on your back to sitting on the side of a flat bed without using bedrails?: A Little Help needed moving to and from a bed to a chair (including a wheelchair)?: A Lot Help needed standing up from a chair using your arms (e.g., wheelchair or bedside chair)?: A Lot Help needed to walk in hospital room?: A Lot Help needed climbing 3-5 steps with a railing? : Total 6 Click Score: 13    End of Session Equipment Utilized  During Treatment: Gait belt Activity Tolerance: Patient limited by fatigue Patient left: with call bell/phone within reach;in bed;with bed alarm set Nurse Communication: Mobility status PT Visit Diagnosis: Unsteadiness on feet (R26.81);Muscle weakness (generalized) (M62.81);Difficulty in walking, not elsewhere classified (R26.2)     Time: 1610-9604 PT Time Calculation (min) (ACUTE ONLY): 24 min  Charges:  $Therapeutic Activity: 8-22 mins $Canalith Rep Proc: 8-22 mins                     Lewis Shock, PT, DPT Acute Rehabilitation Services Secure chat preferred Office #: [phone]    Iona Hansen 04/14/2023, 2:03 PM

## 2023-04-14 NOTE — Progress Notes (Signed)
Patient refused to ambulate and sit up on the chair.Tells its too late and doesnot feel like sitting.Education provided as needed.

## 2023-04-15 DIAGNOSIS — G4733 Obstructive sleep apnea (adult) (pediatric): Secondary | ICD-10-CM | POA: Diagnosis not present

## 2023-04-15 DIAGNOSIS — E039 Hypothyroidism, unspecified: Secondary | ICD-10-CM | POA: Diagnosis not present

## 2023-04-15 DIAGNOSIS — Z7401 Bed confinement status: Secondary | ICD-10-CM | POA: Diagnosis not present

## 2023-04-15 DIAGNOSIS — R2681 Unsteadiness on feet: Secondary | ICD-10-CM | POA: Diagnosis not present

## 2023-04-15 DIAGNOSIS — N1831 Chronic kidney disease, stage 3a: Secondary | ICD-10-CM | POA: Diagnosis not present

## 2023-04-15 DIAGNOSIS — E871 Hypo-osmolality and hyponatremia: Secondary | ICD-10-CM | POA: Diagnosis not present

## 2023-04-15 DIAGNOSIS — K5900 Constipation, unspecified: Secondary | ICD-10-CM | POA: Diagnosis not present

## 2023-04-15 DIAGNOSIS — G894 Chronic pain syndrome: Secondary | ICD-10-CM | POA: Diagnosis not present

## 2023-04-15 DIAGNOSIS — J9601 Acute respiratory failure with hypoxia: Secondary | ICD-10-CM | POA: Diagnosis not present

## 2023-04-15 DIAGNOSIS — J189 Pneumonia, unspecified organism: Secondary | ICD-10-CM | POA: Diagnosis not present

## 2023-04-15 DIAGNOSIS — D72829 Elevated white blood cell count, unspecified: Secondary | ICD-10-CM | POA: Diagnosis not present

## 2023-04-15 DIAGNOSIS — F32A Depression, unspecified: Secondary | ICD-10-CM | POA: Diagnosis not present

## 2023-04-15 DIAGNOSIS — Z743 Need for continuous supervision: Secondary | ICD-10-CM | POA: Diagnosis not present

## 2023-04-15 DIAGNOSIS — R0602 Shortness of breath: Secondary | ICD-10-CM | POA: Diagnosis not present

## 2023-04-15 DIAGNOSIS — N179 Acute kidney failure, unspecified: Secondary | ICD-10-CM | POA: Diagnosis not present

## 2023-04-15 DIAGNOSIS — R413 Other amnesia: Secondary | ICD-10-CM | POA: Diagnosis not present

## 2023-04-15 DIAGNOSIS — I480 Paroxysmal atrial fibrillation: Secondary | ICD-10-CM | POA: Diagnosis not present

## 2023-04-15 DIAGNOSIS — E43 Unspecified severe protein-calorie malnutrition: Secondary | ICD-10-CM | POA: Diagnosis not present

## 2023-04-15 DIAGNOSIS — I10 Essential (primary) hypertension: Secondary | ICD-10-CM | POA: Diagnosis not present

## 2023-04-15 DIAGNOSIS — D649 Anemia, unspecified: Secondary | ICD-10-CM | POA: Diagnosis not present

## 2023-04-15 DIAGNOSIS — J441 Chronic obstructive pulmonary disease with (acute) exacerbation: Secondary | ICD-10-CM | POA: Diagnosis not present

## 2023-04-15 DIAGNOSIS — K219 Gastro-esophageal reflux disease without esophagitis: Secondary | ICD-10-CM | POA: Diagnosis not present

## 2023-04-15 DIAGNOSIS — R531 Weakness: Secondary | ICD-10-CM | POA: Diagnosis not present

## 2023-04-15 LAB — BASIC METABOLIC PANEL
Anion gap: 11 (ref 5–15)
BUN: 12 mg/dL (ref 8–23)
CO2: 22 mmol/L (ref 22–32)
Calcium: 8.8 mg/dL — ABNORMAL LOW (ref 8.9–10.3)
Chloride: 100 mmol/L (ref 98–111)
Creatinine, Ser: 0.76 mg/dL (ref 0.44–1.00)
GFR, Estimated: >60 mL/min (ref >60)
Glucose, Bld: 91 mg/dL (ref 70–99)
Potassium: 3.8 mmol/L (ref 3.5–5.1)
Sodium: 133 mmol/L — ABNORMAL LOW (ref 135–145)

## 2023-04-15 MED ORDER — ENSURE ENLIVE PO LIQD: 237.0000 mL | Freq: Two times a day (BID) | ORAL | 12 refills | Status: DC

## 2023-04-15 MED ORDER — LOSARTAN POTASSIUM 100 MG PO TABS: 100.0000 mg | ORAL_TABLET | Freq: Every day | ORAL | Status: DC

## 2023-04-15 MED ORDER — AMLODIPINE BESYLATE 5 MG PO TABS: 5.0000 mg | ORAL_TABLET | Freq: Every day | ORAL | Status: DC

## 2023-04-15 MED ORDER — ALBUTEROL SULFATE (2.5 MG/3ML) 0.083% IN NEBU: 2.5000 mg | INHALATION_SOLUTION | RESPIRATORY_TRACT | 12 refills | Status: DC | PRN

## 2023-04-15 NOTE — Discharge Summary (Signed)
Physician Discharge Summary  Samantha Fletcher:[id number] DOB: Jul 12, 1946 DOA: 04/09/2023  PCP: Marylen Ponto, MD  Admit date: 04/09/2023 Discharge date: 04/15/2023  Admitted From: (Home) Disposition:  (SNF)  Recommendations for Outpatient Follow-up:  Please obtain BMP/CBC in one week Please drink to use incentive spirometer and flutter valve at facility   Discharge Condition: (Stable) CODE STATUS: (FULL) Diet recommendation: Heart Healthy     Brief/Interim Summary:  Samantha Fletcher is a 77 y.o. female with medical history significant for hypertension, COPD, CKD 3A, OSA with CPAP intolerance, PAF not anticoagulated, depression, anxiety, and chronic pain who presents to the emergency department for evaluation of productive cough, shortness of breath, fatigue, and loss of appetite.  patient is found to be afebrile and saturating upper 80s on room air with mild tachypnea, normal heart rate, and stable blood pressure..  Chest x-ray is concerning for developing pneumonia.  Labs are most notable for sodium 132, BUN 51, creatinine 1.32, and WBC 21,900.  COVID PCR was negative. she was admitted for further workup, treated with IV antibiotics, further hypoxia, sepsis has resolved.   Acute hypoxic respiratory failure  Sepsis(POA) due to pneumonia - treated Rocephin and azithromycin, she finished 5 days treatment,no further need of antibiotic on discharge. -strep pneumonia is negative and Legionella antigen is negative as well -Resp viral panel is negative -hypoxia has resolved at time of discharge . -she was again encouraged to use incentive spirometry and flutter valve -Significant leukocytosis, and significantly elevated procalcitonin on admission, this has been trending down and improving gradually.    Hx of CVA  - Continue ASA and statin     Hyponatremia -Improving    Hypertension  - Hold HCTZ in light of hypovolemia and hyponatremia  -Started on Norvasc   CKD IIIa  - SCr is  1.32 on admission, appears close to baseline based on labs in Care Everywhere    PAF  - Had an isolated episode in June 2023, declined anticoagulation when offered by cardiology   - Continue aspirin, monitor   Hypothyroidism  - Continue Synthroid    COPD  - Not in exacerbation on admission  - Continue inhalers     Memory impairment  - Continue Aricept, use delirium precautions    Weakness/deconditioning -Continue with PT/OT   Hypokalemia  - repleted   Generalized weakness/ fatigue -B12, folic acid, cortisol and TSH with no significant abnormalities.      Discharge Diagnoses:  Principal Problem:   Pneumonia Active Problems:   Anxiety and depression   Hypothyroidism   CKD stage 3a, GFR 45-59 ml/min (HCC)   COPD (chronic obstructive pulmonary disease) (HCC)   Hypertension   Chronic pain syndrome   PAF (paroxysmal atrial fibrillation) (HCC)   Memory impairment   Acute respiratory failure with hypoxia (HCC)   Hyponatremia    Discharge Instructions  Discharge Instructions     Discharge instructions   Complete by: As directed    Follow with Primary MD Marylen Ponto, MD/SNF physician  Get CBC, CMP,  checked  by Primary MD next visit.    Activity: As tolerated with Full fall precautions use walker/cane & assistance as needed   Disposition SNF   Diet: Heart Healthy   On your next visit with your primary care physician please Get Medicines reviewed and adjusted.   Please request your Prim.MD to go over all Hospital Tests and Procedure/Radiological results at the follow up, please get all Hospital records sent to your Prim MD by signing  hospital release before you go home.   If you experience worsening of your admission symptoms, develop shortness of breath, life threatening emergency, suicidal or homicidal thoughts you must seek medical attention immediately by calling 911 or calling your MD immediately  if symptoms less severe.  You Must read complete  instructions/literature along with all the possible adverse reactions/side effects for all the Medicines you take and that have been prescribed to you. Take any new Medicines after you have completely understood and accpet all the possible adverse reactions/side effects.   Do not drive, operating heavy machinery, perform activities at heights, swimming or participation in water activities or provide baby sitting services if your were admitted for syncope or siezures until you have seen by Primary MD or a Neurologist and advised to do so again.  Do not drive when taking Pain medications.    Do not take more than prescribed Pain, Sleep and Anxiety Medications  Special Instructions: If you have smoked or chewed Tobacco  in the last 2 yrs please stop smoking, stop any regular Alcohol  and or any Recreational drug use.  Wear Seat belts while driving.   Please note  You were cared for by a hospitalist during your hospital stay. If you have any questions about your discharge medications or the care you received while you were in the hospital after you are discharged, you can call the unit and asked to speak with the hospitalist on call if the hospitalist that took care of you is not available. Once you are discharged, your primary care physician will handle any further medical issues. Please note that NO REFILLS for any discharge medications will be authorized once you are discharged, as it is imperative that you return to your primary care physician (or establish a relationship with a primary care physician if you do not have one) for your aftercare needs so that they can reassess your need for medications and monitor your lab values.   Increase activity slowly   Complete by: As directed       Allergies as of 04/15/2023       Reactions   Celecoxib Other (See Comments)   Mouth sores   Gabapentin Other (See Comments)   Confused, psychotic    Nabumetone Other (See Comments)   Mouth sores    Naproxen Other (See Comments)   Dropped WBC count   Tramadol Other (See Comments)   Mouth sores   Lyrica [pregabalin] Other (See Comments)   confusion   Buspar [buspirone] Anxiety        Medication List     STOP taking these medications    ALPRAZolam 1 MG tablet Commonly known as: XANAX   diclofenac sodium 1 % Gel Commonly known as: Voltaren   ezetimibe-simvastatin 10-20 MG tablet Commonly known as: VYTORIN   FERATAB PO   lisinopril 20 MG tablet Commonly known as: ZESTRIL   losartan-hydrochlorothiazide 100-12.5 MG tablet Commonly known as: HYZAAR   ProAir HFA 108 (90 Base) MCG/ACT inhaler Generic drug: albuterol Replaced by: albuterol (2.5 MG/3ML) 0.083% nebulizer solution   promethazine 25 MG tablet Commonly known as: PHENERGAN   telmisartan-hydrochlorothiazide 40-12.5 MG tablet Commonly known as: MICARDIS HCT   traZODone 50 MG tablet Commonly known as: DESYREL   zolpidem 5 MG tablet Commonly known as: AMBIEN       TAKE these medications    albuterol (2.5 MG/3ML) 0.083% nebulizer solution Commonly known as: PROVENTIL Take 3 mLs (2.5 mg total) by nebulization every 4 (four) hours  as needed for wheezing. Replaces: ProAir HFA 108 (90 Base) MCG/ACT inhaler   amLODipine 5 MG tablet Commonly known as: NORVASC Take 1 tablet (5 mg total) by mouth daily.   aspirin EC 81 MG tablet Take 81 mg by mouth as directed. 3 times a week   Cymbalta 60 MG capsule Generic drug: DULoxetine Take 60 mg by mouth daily.   donepezil 5 MG tablet Commonly known as: ARICEPT Take 5 mg by mouth daily with breakfast.   feeding supplement Liqd Take 237 mLs by mouth 2 (two) times daily between meals.   levothyroxine 75 MCG tablet Commonly known as: SYNTHROID Take by mouth.   losartan 100 MG tablet Commonly known as: COZAAR Take 1 tablet (100 mg total) by mouth daily.   pantoprazole 40 MG tablet Commonly known as: PROTONIX Take 1 tablet (40 mg total) by mouth 2  (two) times daily.   rosuvastatin 10 MG tablet Commonly known as: CRESTOR Take 10 mg by mouth daily.   Stool Softener 100 MG capsule Generic drug: docusate sodium Take 100 mg by mouth as needed for mild constipation.        Contact information for after-discharge care     Destination     [facility] [place] Preferred SNF .   Service: Skilled Nursing Contact information: [address] [place] [address] [phone]                    Allergies  Allergen Reactions   Celecoxib Other (See Comments)    Mouth sores   Gabapentin Other (See Comments)    Confused, psychotic    Nabumetone Other (See Comments)    Mouth sores   Naproxen Other (See Comments)    Dropped WBC count   Tramadol Other (See Comments)    Mouth sores   Lyrica [Pregabalin] Other (See Comments)    confusion   Buspar [Buspirone] Anxiety    Consultations: None   Procedures/Studies: CT Head Wo Contrast  Result Date: 04/09/2023 CLINICAL DATA:  Mental status change EXAM: CT HEAD WITHOUT CONTRAST TECHNIQUE: Contiguous axial images were obtained from the base of the skull through the vertex without intravenous contrast. RADIATION DOSE REDUCTION: This exam was performed according to the departmental dose-optimization program which includes automated exposure control, adjustment of the mA and/or kV according to patient size and/or use of iterative reconstruction technique. COMPARISON:  Head CT 02/05/2023 FINDINGS: Brain: There is no evidence for acute infarction, hemorrhage, hydrocephalus, extra-axial fluid collection or mass effect. Again seen is mild diffuse atrophy and extensive periventricular and deep white matter hypodensity, likely chronic small vessel ischemic change. Vascular: Atherosclerotic calcifications are present within the cavernous internal carotid arteries. Skull: Normal. Negative for fracture or focal lesion. Sinuses/Orbits: No acute finding. Other:  None. IMPRESSION: 1. No acute intracranial process. 2. Mild diffuse atrophy and extensive chronic small vessel ischemic change. Electronically Signed   By: Darliss Cheney M.D.   On: 04/09/2023 18:10   DG Chest 1 View  Result Date: 04/09/2023 CLINICAL DATA:  Low oxygen saturation and weakness today. Upper and lower back pain. EXAM: CHEST  1 VIEW COMPARISON:  01/11/2023 FINDINGS: Shallow inspiration. Heart size and pulmonary vascularity are normal. Developing infiltration in the lung bases bilaterally, greater on the right. This likely represents multifocal pneumonia or possibly atelectasis. Small right pleural effusion with fluid in the fissure. No pneumothorax. Calcified and tortuous aorta. Leads stimulators projecting over the midthoracic region. Old fracture deformity of the proximal right humerus. IMPRESSION:  Developing infiltration or atelectasis in the lung bases bilaterally, greater on the right, likely pneumonia. Small right pleural effusion with fluid in the fissure. Electronically Signed   By: Burman Nieves M.D.   On: 04/09/2023 16:09      Subjective:  No significant events overnight as discussed with staff, she denies any complaints today.  Discharge Exam: Vitals:   04/15/23 0510 04/15/23 0736  BP: (!) 152/60 (!) 152/67  Pulse: 60 62  Resp: 18 19  Temp: 98 F (36.7 C) 98.7 F (37.1 C)  SpO2: 92% 94%   Vitals:   04/15/23 0149 04/15/23 0432 04/15/23 0510 04/15/23 0736  BP: (!) 142/59 (!) 143/62 (!) 152/60 (!) 152/67  Pulse: 64  60 62  Resp:   18 19  Temp: 97.9 F (36.6 C) 97.6 F (36.4 C) 98 F (36.7 C) 98.7 F (37.1 C)  TempSrc: Oral Oral Oral Oral  SpO2: 93% 94% 92% 94%  Weight:      Height:        General: Pt is alert, awake, not in acute distress,frail Cardiovascular: RRR, S1/S2 +, no rubs, no gallops Respiratory: CTA bilaterally, no wheezing, no rhonchi Abdominal: Soft, NT, ND, bowel sounds + Extremities: no edema, no cyanosis    The results of  significant diagnostics from this hospitalization (including imaging, microbiology, ancillary and laboratory) are listed below for reference.     Microbiology: Recent Results (from the past 240 hour(s))  SARS Coronavirus 2 by RT PCR (hospital order, performed in [facility] hospital lab) *cepheid single result test* Anterior Nasal Swab     Status: None   Collection Time: 04/09/23  2:35 PM   Specimen: Anterior Nasal Swab  Result Value Ref Range Status   SARS Coronavirus 2 by RT PCR NEGATIVE NEGATIVE Final    Comment: Performed at [facility], [address], [place], [place] [address]  Respiratory (~20 pathogens) panel by PCR     Status: None   Collection Time: 04/10/23  2:29 PM   Specimen: Nasopharyngeal Swab; Respiratory  Result Value Ref Range Status   Adenovirus NOT DETECTED NOT DETECTED Final   Coronavirus 229E NOT DETECTED NOT DETECTED Final    Comment: (NOTE) The Coronavirus on the Respiratory Panel, DOES NOT test for the novel  Coronavirus (2019 nCoV)    Coronavirus HKU1 NOT DETECTED NOT DETECTED Final   Coronavirus NL63 NOT DETECTED NOT DETECTED Final   Coronavirus OC43 NOT DETECTED NOT DETECTED Final   Metapneumovirus NOT DETECTED NOT DETECTED Final   Rhinovirus / Enterovirus NOT DETECTED NOT DETECTED Final   Influenza A NOT DETECTED NOT DETECTED Final   Influenza B NOT DETECTED NOT DETECTED Final   Parainfluenza Virus 1 NOT DETECTED NOT DETECTED Final   Parainfluenza Virus 2 NOT DETECTED NOT DETECTED Final   Parainfluenza Virus 3 NOT DETECTED NOT DETECTED Final   Parainfluenza Virus 4 NOT DETECTED NOT DETECTED Final   Respiratory Syncytial Virus NOT DETECTED NOT DETECTED Final   Bordetella pertussis NOT DETECTED NOT DETECTED Final   Bordetella Parapertussis NOT DETECTED NOT DETECTED Final   Chlamydophila pneumoniae NOT DETECTED NOT DETECTED Final   Mycoplasma pneumoniae NOT DETECTED NOT DETECTED Final    Comment: Performed at [facility], [address], [place], [place] [address]     Labs: BNP (last 3 results) No results for input(s): "BNP" in the last 8760 hours. Basic Metabolic Panel: Recent Labs  Lab 04/10/23 0421 04/11/23 0981 04/12/23 0344 04/13/23 0353 04/14/23 0447 04/15/23 0446  NA 131* 133* 129* 128* 129* 133*  K 3.1* 5.1 4.6 4.1 3.9 3.8  CL 96* 100 96* 97* 97* 100  CO2 22 20* 20* 19* 21* 22  GLUCOSE 92 81 84 93 90 91  BUN 42* 38* 27* 19 13 12   CREATININE 1.20* 0.95 0.83 0.67 0.77 0.76  CALCIUM 8.1* 8.9 8.7* 8.5* 8.4* 8.8*  MG 2.1  --   --  1.6*  --   --   PHOS 3.0  --   --  3.9  --   --    Liver Function Tests: Recent Labs  Lab 04/09/23 1725  AST 58*  ALT 28  ALKPHOS 117  BILITOT 1.3*  PROT 7.0  ALBUMIN 2.7*   Recent Labs  Lab 04/09/23 1725  LIPASE 34   No results for input(s): "AMMONIA" in the last 168 hours. CBC: Recent Labs  Lab 04/09/23 1512 04/09/23 1731 04/10/23 0421 04/11/23 0633 04/12/23 0344 04/13/23 0353  WBC 21.9*  --  20.5* 15.9* 13.6* 13.5*  HGB 10.9* 9.9* 9.1* 8.7* 8.7* 8.3*  HCT 34.3* 29.0* 28.2* 27.2* 27.2* 26.4*  MCV 85.1  --  85.7 87.5 88.0 86.8  PLT 429*  --  374 407* 469* 503*   Cardiac Enzymes: No results for input(s): "CKTOTAL", "CKMB", "CKMBINDEX", "TROPONINI" in the last 168 hours. BNP: Invalid input(s): "POCBNP" CBG: Recent Labs  Lab 04/09/23 1436  GLUCAP 99   D-Dimer No results for input(s): "DDIMER" in the last 72 hours. Hgb A1c No results for input(s): "HGBA1C" in the last 72 hours. Lipid Profile No results for input(s): "CHOL", "HDL", "LDLCALC", "TRIG", "CHOLHDL", "LDLDIRECT" in the last 72 hours. Thyroid function studies Recent Labs    04/12/23 1414  TSH 1.259   Anemia work up Recent Labs    04/12/23 1414  VITAMINB12 1,036*   Urinalysis    Component Value Date/Time   COLORURINE YELLOW 10/03/2016 2039   APPEARANCEUR TURBID (A) 10/03/2016 2039   LABSPEC 1.041 (H) 10/03/2016 2039   PHURINE 6.0 10/03/2016 2039   GLUCOSEU NEGATIVE  10/03/2016 2039   HGBUR LARGE (A) 10/03/2016 2039   BILIRUBINUR NEGATIVE 10/03/2016 2039   KETONESUR 15 (A) 10/03/2016 2039   PROTEINUR 30 (A) 10/03/2016 2039   NITRITE NEGATIVE 10/03/2016 2039   LEUKOCYTESUR LARGE (A) 10/03/2016 2039   Sepsis Labs Recent Labs  Lab 04/10/23 0421 04/11/23 0633 04/12/23 0344 04/13/23 0353  WBC 20.5* 15.9* 13.6* 13.5*   Microbiology Recent Results (from the past 240 hour(s))  SARS Coronavirus 2 by RT PCR (hospital order, performed in [facility] hospital lab) *cepheid single result test* Anterior Nasal Swab     Status: None   Collection Time: 04/09/23  2:35 PM   Specimen: Anterior Nasal Swab  Result Value Ref Range Status   SARS Coronavirus 2 by RT PCR NEGATIVE NEGATIVE Final    Comment: Performed at [facility] Lab, [address] [place], [place] [address]  Respiratory (~20 pathogens) panel by PCR     Status: None   Collection Time: 04/10/23  2:29 PM   Specimen: Nasopharyngeal Swab; Respiratory  Result Value Ref Range Status   Adenovirus NOT DETECTED NOT DETECTED Final   Coronavirus 229E NOT DETECTED NOT DETECTED Final    Comment: (NOTE) The Coronavirus on the Respiratory Panel, DOES NOT test for the novel  Coronavirus (2019 nCoV)    Coronavirus HKU1 NOT DETECTED NOT DETECTED Final   Coronavirus NL63 NOT DETECTED NOT DETECTED Final   Coronavirus OC43 NOT DETECTED NOT DETECTED Final  Metapneumovirus NOT DETECTED NOT DETECTED Final   Rhinovirus / Enterovirus NOT DETECTED NOT DETECTED Final   Influenza A NOT DETECTED NOT DETECTED Final   Influenza B NOT DETECTED NOT DETECTED Final   Parainfluenza Virus 1 NOT DETECTED NOT DETECTED Final   Parainfluenza Virus 2 NOT DETECTED NOT DETECTED Final   Parainfluenza Virus 3 NOT DETECTED NOT DETECTED Final   Parainfluenza Virus 4 NOT DETECTED NOT DETECTED Final   Respiratory Syncytial Virus NOT DETECTED NOT DETECTED Final   Bordetella pertussis NOT DETECTED NOT DETECTED Final   Bordetella  Parapertussis NOT DETECTED NOT DETECTED Final   Chlamydophila pneumoniae NOT DETECTED NOT DETECTED Final   Mycoplasma pneumoniae NOT DETECTED NOT DETECTED Final    Comment: Performed at [facility], [address], [place], [place] [address]     Time coordinating discharge: Over 30 minutes  SIGNED:   Huey Bienenstock, MD  [facility] 04/15/2023, 10:28 AM Pager   If 7PM-7AM, please contact night-coverage [email or web address]

## 2023-04-15 NOTE — TOC Transition Note (Signed)
Transition of Care [facility]) - CM/SW Discharge Note   Patient Details  Name: Samantha Fletcher MRN: [id number] Date of Birth: 24-Jul-1946  Transition of Care [facility]) CM/SW Contact:  Mearl Latin, LCSW Phone Number: 04/15/2023, 2:47 PM   Clinical Narrative:    Patient will DC to: [facility] Anticipated DC date: 04/15/23 Family notified: Sister Transport by: Sharin Mons   Per MD patient ready for DC to [facility]. RN to call report prior to discharge [phone] room 205). RN, patient, patient's family, and facility notified of DC. Discharge Summary and FL2 sent to facility. DC packet on chart. Ambulance transport requested for patient.   CSW will sign off for now as social work intervention is no longer needed. Please consult [place] again if new needs arise.     Final next level of care: Skilled Nursing Facility Barriers to Discharge: Barriers Resolved   Patient Goals and CMS Choice CMS Medicare.gov Compare Post Acute Care list provided to:: Patient Choice offered to / list presented to : Patient  Discharge Placement     Existing PASRR number confirmed : 04/15/23          Patient chooses bed at: [facility] [place] Patient to be transferred to facility by: [facility] Name of family member notified: Sister Patient and family notified of of transfer: 04/15/23  Discharge Plan and Services Additional resources added to the After Visit Summary for   In-house Referral: Clinical Social Work Discharge Planning Services: CM Consult Post Acute Care Choice: Skilled Nursing Facility                               Social Determinants of Health (SDOH) Interventions SDOH Screenings   Food Insecurity: No Food Insecurity (04/09/2023)  Housing: Low Risk  (04/09/2023)  Transportation Needs: No Transportation Needs (04/09/2023)  Utilities: Not At Risk (04/09/2023)  Tobacco Use: Medium Risk (04/09/2023)     Readmission Risk Interventions     No data to display

## 2023-04-15 NOTE — Progress Notes (Signed)
Occupational Therapy Treatment Patient Details Name: Samantha Fletcher MRN: [id number] DOB: 12/23/1945 Today's Date: 04/15/2023   History of present illness 77 yo female with onset of cough and fatigue, SOB and loss of appetite was admitted 5/15. Dx with acute respiratory failure, PNA, and O2 use. Replenished Na+ and K+. PMHx:  anxiety, depression, memory changes, COPD, CKD3a, hypothyroidism, HTN, chronic pain, PAF, hyponatremia, CVA, weakness   OT comments  Pt supine in bed with HOB elevated upon OT arrival. Pt refused OOB activity this day secondary to having just returned to bed after sitting up in recliner. Pt agreeable to participation from bed level. OT performed pill box assessment with pt. Pt participated well, but demonstrated poor attention to tasks and received a failing score on the assessment with a total error score of 12 (see below for additional details regarding errors). OT educated pt in OT recommendation for pt to receive assistance from son for medication management with pt verbalizing understanding and agreement but demonstrating poor carryover of education <5 minutes later. Pt will benefit from continued acute skilled OT services. Discharge recommendation updated. Post acute discharge, pt will benefit from intensive inpatient skilled OT services <3 hours daily.    Recommendations for follow up therapy are one component of a multi-disciplinary discharge planning process, led by the attending physician.  Recommendations may be updated based on patient status, additional functional criteria and insurance authorization.    Assistance Recommended at Discharge Frequent or constant Supervision/Assistance  Patient can return home with the following  A little help with walking and/or transfers;A little help with bathing/dressing/bathroom;Assistance with cooking/housework;Direct supervision/assist for medications management;Direct supervision/assist for financial management;Assist for  transportation;Help with stairs or ramp for entrance   Equipment Recommendations  None recommended by OT    Recommendations for Other Services      Precautions / Restrictions Precautions Precautions: Fall Restrictions Weight Bearing Restrictions: No       Mobility Bed Mobility                    Transfers                   General transfer comment: Pt supine in bed with HOB elevated upon OT arrival. Pt refused OOB activity this session stating she had been sitting up in the recliner for the majority of the day and did not want to get back up. OT edcuated pt in importance of OOB activity with pt verbalizing understanding and continuing to refuse OOB activity this session.     Balance                                           ADL either performed or assessed with clinical judgement   ADL                                              Extremity/Trunk Assessment              Vision       Perception     Praxis      Cognition Arousal/Alertness: Awake/alert Behavior During Therapy: WFL for tasks assessed/performed Overall Cognitive Status: Impaired/Different from baseline (No family present to confirm if baseline) Area of Impairment: Attention, Memory, Following commands, Safety/judgement, Awareness,  Problem solving                   Current Attention Level: Sustained Memory: Decreased recall of precautions, Decreased short-term memory Following Commands: Follows one step commands consistently, Follows one step commands with increased time Safety/Judgement: Decreased awareness of safety, Decreased awareness of deficits Awareness: Emergent Problem Solving: Slow processing, Difficulty sequencing, Requires verbal cues General Comments: Pt participated in pill box assessment and received a failing score. Pt required 11 minutes to complete task and made 4 omission errors and 8 misplaced/number of pills errors,  for a total error score of 12. During assessment, pt demonstrated poor attention to task and was easily distracted by noise in the hallway with door closed and curtain pulled. During assessment, pt often took several "pills" from the bottle at a time and initialy placed them correctly. However, whenever she returned to a bottle for additional pills to complete the week, she placed a second pill in the last box she filled without any awareness of the error. For the "every other day" instruction, pt completed Sunday and Tuesday correctly then proeeded to place a pill every day for the remainder of the week. OT reviewed assessment with pt after completion and discussed OT recommendation of pt having assistance from son to manage medications with pt verbalizing understanding, but stating a few minutes later that she would "just be careful" when setting up her med box by herself. OT reinforced recommendation for assistance with medication management with pt again verbalizing understanding. OT to continue to address pt cognition deficits.        Exercises      Shoulder Instructions       General Comments      Pertinent Vitals/ Pain       Pain Assessment Pain Assessment: Faces Faces Pain Scale: Hurts little more Pain Location: generalized, worse in back Pain Descriptors / Indicators: Aching, Sore, Grimacing Pain Intervention(s): Limited activity within patient's tolerance, Monitored during session, Repositioned  Home Living                                          Prior Functioning/Environment              Frequency  Min 2X/week        Progress Toward Goals  OT Goals(current goals can now be found in the care plan section)  Progress towards OT goals: Progressing toward goals  Acute Rehab OT Goals Patient Stated Goal: To get stronger in short-term rehab then return home with son.  Plan Discharge plan needs to be updated    Co-evaluation                  AM-PAC OT "6 Clicks" Daily Activity     Outcome Measure   Help from another person eating meals?: None Help from another person taking care of personal grooming?: A Little Help from another person toileting, which includes using toliet, bedpan, or urinal?: A Little Help from another person bathing (including washing, rinsing, drying)?: A Little Help from another person to put on and taking off regular upper body clothing?: A Little Help from another person to put on and taking off regular lower body clothing?: A Little 6 Click Score: 19    End of Session    OT Visit Diagnosis: Unsteadiness on feet (R26.81);Muscle weakness (generalized) (M62.81);Other symptoms and signs involving cognitive function;History  of falling (Z91.81)   Activity Tolerance Patient tolerated treatment well   Patient Left in bed;with call bell/phone within reach;with bed alarm set   Nurse Communication Mobility status        Time: 6578-4696 OT Time Calculation (min): 39 min  Charges: OT General Charges $OT Visit: 1 Visit OT Treatments $Self Care/Home Management : 38-52 mins  Clair Alfieri "Orson Eva., OTR/L, MA Acute Rehab [phone]   Lendon Colonel 04/15/2023, 4:31 PM

## 2023-04-15 NOTE — Discharge Instructions (Signed)
Follow with Primary MD Marylen Ponto, MD/SNF physician  Get CBC, CMP,  checked  by Primary MD next visit.    Activity: As tolerated with Full fall precautions use walker/cane & assistance as needed   Disposition SNF   Diet: Heart Healthy   On your next visit with your primary care physician please Get Medicines reviewed and adjusted.   Please request your Prim.MD to go over all Hospital Tests and Procedure/Radiological results at the follow up, please get all Hospital records sent to your Prim MD by signing hospital release before you go home.   If you experience worsening of your admission symptoms, develop shortness of breath, life threatening emergency, suicidal or homicidal thoughts you must seek medical attention immediately by calling 911 or calling your MD immediately  if symptoms less severe.  You Must read complete instructions/literature along with all the possible adverse reactions/side effects for all the Medicines you take and that have been prescribed to you. Take any new Medicines after you have completely understood and accpet all the possible adverse reactions/side effects.   Do not drive, operating heavy machinery, perform activities at heights, swimming or participation in water activities or provide baby sitting services if your were admitted for syncope or siezures until you have seen by Primary MD or a Neurologist and advised to do so again.  Do not drive when taking Pain medications.    Do not take more than prescribed Pain, Sleep and Anxiety Medications  Special Instructions: If you have smoked or chewed Tobacco  in the last 2 yrs please stop smoking, stop any regular Alcohol  and or any Recreational drug use.  Wear Seat belts while driving.   Please note  You were cared for by a hospitalist during your hospital stay. If you have any questions about your discharge medications or the care you received while you were in the hospital after you are  discharged, you can call the unit and asked to speak with the hospitalist on call if the hospitalist that took care of you is not available. Once you are discharged, your primary care physician will handle any further medical issues. Please note that NO REFILLS for any discharge medications will be authorized once you are discharged, as it is imperative that you return to your primary care physician (or establish a relationship with a primary care physician if you do not have one) for your aftercare needs so that they can reassess your need for medications and monitor your lab values.

## 2023-05-21 DIAGNOSIS — Z681 Body mass index (BMI) 19 or less, adult: Secondary | ICD-10-CM | POA: Diagnosis not present

## 2023-05-21 DIAGNOSIS — R319 Hematuria, unspecified: Secondary | ICD-10-CM | POA: Diagnosis not present

## 2023-05-21 DIAGNOSIS — Z0000 Encounter for general adult medical examination without abnormal findings: Secondary | ICD-10-CM | POA: Diagnosis not present

## 2023-05-21 DIAGNOSIS — R300 Dysuria: Secondary | ICD-10-CM | POA: Diagnosis not present

## 2023-05-21 DIAGNOSIS — B3731 Acute candidiasis of vulva and vagina: Secondary | ICD-10-CM | POA: Diagnosis not present

## 2023-05-25 ENCOUNTER — Other Ambulatory Visit: Payer: Self-pay

## 2023-05-25 ENCOUNTER — Inpatient Hospital Stay (HOSPITAL_COMMUNITY)
Admission: EM | Admit: 2023-05-25 | Discharge: 2023-06-09 | DRG: 673 | Disposition: A | Discharge status: Home / Self Care | Payer: Medicare HMO | Attending: Internal Medicine | Admitting: Internal Medicine

## 2023-05-25 DIAGNOSIS — J9811 Atelectasis: Secondary | ICD-10-CM | POA: Diagnosis present

## 2023-05-25 DIAGNOSIS — E039 Hypothyroidism, unspecified: Secondary | ICD-10-CM | POA: Diagnosis present

## 2023-05-25 DIAGNOSIS — E785 Hyperlipidemia, unspecified: Secondary | ICD-10-CM | POA: Diagnosis present

## 2023-05-25 DIAGNOSIS — E872 Acidosis, unspecified: Secondary | ICD-10-CM | POA: Diagnosis present

## 2023-05-25 DIAGNOSIS — Z885 Allergy status to narcotic agent status: Secondary | ICD-10-CM

## 2023-05-25 DIAGNOSIS — J90 Pleural effusion, not elsewhere classified: Secondary | ICD-10-CM | POA: Diagnosis not present

## 2023-05-25 DIAGNOSIS — R34 Anuria and oliguria: Secondary | ICD-10-CM | POA: Diagnosis present

## 2023-05-25 DIAGNOSIS — Z515 Encounter for palliative care: Secondary | ICD-10-CM | POA: Diagnosis not present

## 2023-05-25 DIAGNOSIS — F988 Other specified behavioral and emotional disorders with onset usually occurring in childhood and adolescence: Secondary | ICD-10-CM | POA: Diagnosis present

## 2023-05-25 DIAGNOSIS — M549 Dorsalgia, unspecified: Secondary | ICD-10-CM | POA: Diagnosis not present

## 2023-05-25 DIAGNOSIS — E871 Hypo-osmolality and hyponatremia: Secondary | ICD-10-CM | POA: Diagnosis present

## 2023-05-25 DIAGNOSIS — Z79899 Other long term (current) drug therapy: Secondary | ICD-10-CM | POA: Diagnosis not present

## 2023-05-25 DIAGNOSIS — F329 Major depressive disorder, single episode, unspecified: Secondary | ICD-10-CM | POA: Diagnosis not present

## 2023-05-25 DIAGNOSIS — I120 Hypertensive chronic kidney disease with stage 5 chronic kidney disease or end stage renal disease: Secondary | ICD-10-CM | POA: Diagnosis present

## 2023-05-25 DIAGNOSIS — E875 Hyperkalemia: Secondary | ICD-10-CM | POA: Diagnosis present

## 2023-05-25 DIAGNOSIS — I480 Paroxysmal atrial fibrillation: Secondary | ICD-10-CM | POA: Diagnosis present

## 2023-05-25 DIAGNOSIS — I10 Essential (primary) hypertension: Secondary | ICD-10-CM | POA: Diagnosis not present

## 2023-05-25 DIAGNOSIS — F17200 Nicotine dependence, unspecified, uncomplicated: Secondary | ICD-10-CM | POA: Diagnosis present

## 2023-05-25 DIAGNOSIS — J690 Pneumonitis due to inhalation of food and vomit: Secondary | ICD-10-CM | POA: Diagnosis not present

## 2023-05-25 DIAGNOSIS — Z743 Need for continuous supervision: Secondary | ICD-10-CM | POA: Diagnosis not present

## 2023-05-25 DIAGNOSIS — F17210 Nicotine dependence, cigarettes, uncomplicated: Secondary | ICD-10-CM | POA: Diagnosis present

## 2023-05-25 DIAGNOSIS — F32A Depression, unspecified: Secondary | ICD-10-CM | POA: Diagnosis present

## 2023-05-25 DIAGNOSIS — J449 Chronic obstructive pulmonary disease, unspecified: Secondary | ICD-10-CM | POA: Diagnosis not present

## 2023-05-25 DIAGNOSIS — R109 Unspecified abdominal pain: Secondary | ICD-10-CM | POA: Diagnosis not present

## 2023-05-25 DIAGNOSIS — R531 Weakness: Secondary | ICD-10-CM | POA: Diagnosis not present

## 2023-05-25 DIAGNOSIS — Z992 Dependence on renal dialysis: Secondary | ICD-10-CM | POA: Diagnosis not present

## 2023-05-25 DIAGNOSIS — Z7189 Other specified counseling: Secondary | ICD-10-CM | POA: Diagnosis not present

## 2023-05-25 DIAGNOSIS — L299 Pruritus, unspecified: Secondary | ICD-10-CM | POA: Diagnosis present

## 2023-05-25 DIAGNOSIS — Z8249 Family history of ischemic heart disease and other diseases of the circulatory system: Secondary | ICD-10-CM

## 2023-05-25 DIAGNOSIS — N186 End stage renal disease: Secondary | ICD-10-CM | POA: Diagnosis present

## 2023-05-25 DIAGNOSIS — N179 Acute kidney failure, unspecified: Secondary | ICD-10-CM | POA: Diagnosis present

## 2023-05-25 DIAGNOSIS — Z789 Other specified health status: Secondary | ICD-10-CM | POA: Diagnosis not present

## 2023-05-25 DIAGNOSIS — G894 Chronic pain syndrome: Secondary | ICD-10-CM | POA: Diagnosis present

## 2023-05-25 DIAGNOSIS — N170 Acute kidney failure with tubular necrosis: Principal | ICD-10-CM | POA: Diagnosis present

## 2023-05-25 DIAGNOSIS — Z8261 Family history of arthritis: Secondary | ICD-10-CM

## 2023-05-25 DIAGNOSIS — K219 Gastro-esophageal reflux disease without esophagitis: Secondary | ICD-10-CM | POA: Diagnosis present

## 2023-05-25 DIAGNOSIS — J810 Acute pulmonary edema: Secondary | ICD-10-CM | POA: Diagnosis not present

## 2023-05-25 DIAGNOSIS — J441 Chronic obstructive pulmonary disease with (acute) exacerbation: Secondary | ICD-10-CM | POA: Diagnosis not present

## 2023-05-25 DIAGNOSIS — D649 Anemia, unspecified: Secondary | ICD-10-CM | POA: Diagnosis not present

## 2023-05-25 DIAGNOSIS — R7989 Other specified abnormal findings of blood chemistry: Secondary | ICD-10-CM | POA: Diagnosis not present

## 2023-05-25 DIAGNOSIS — Z888 Allergy status to other drugs, medicaments and biological substances status: Secondary | ICD-10-CM

## 2023-05-25 DIAGNOSIS — G4733 Obstructive sleep apnea (adult) (pediatric): Secondary | ICD-10-CM | POA: Diagnosis present

## 2023-05-25 DIAGNOSIS — J9601 Acute respiratory failure with hypoxia: Secondary | ICD-10-CM | POA: Diagnosis not present

## 2023-05-25 DIAGNOSIS — D72829 Elevated white blood cell count, unspecified: Secondary | ICD-10-CM | POA: Diagnosis not present

## 2023-05-25 DIAGNOSIS — G8929 Other chronic pain: Secondary | ICD-10-CM | POA: Diagnosis not present

## 2023-05-25 DIAGNOSIS — D509 Iron deficiency anemia, unspecified: Secondary | ICD-10-CM | POA: Diagnosis present

## 2023-05-25 DIAGNOSIS — J811 Chronic pulmonary edema: Secondary | ICD-10-CM | POA: Diagnosis not present

## 2023-05-25 DIAGNOSIS — F419 Anxiety disorder, unspecified: Secondary | ICD-10-CM | POA: Diagnosis present

## 2023-05-25 DIAGNOSIS — Z9181 History of falling: Secondary | ICD-10-CM

## 2023-05-25 DIAGNOSIS — Z825 Family history of asthma and other chronic lower respiratory diseases: Secondary | ICD-10-CM

## 2023-05-25 DIAGNOSIS — Z66 Do not resuscitate: Secondary | ICD-10-CM | POA: Diagnosis present

## 2023-05-25 DIAGNOSIS — G4700 Insomnia, unspecified: Secondary | ICD-10-CM | POA: Diagnosis present

## 2023-05-25 DIAGNOSIS — Z7982 Long term (current) use of aspirin: Secondary | ICD-10-CM

## 2023-05-25 DIAGNOSIS — M797 Fibromyalgia: Secondary | ICD-10-CM | POA: Diagnosis present

## 2023-05-25 DIAGNOSIS — Z9841 Cataract extraction status, right eye: Secondary | ICD-10-CM

## 2023-05-25 DIAGNOSIS — R6889 Other general symptoms and signs: Secondary | ICD-10-CM | POA: Diagnosis not present

## 2023-05-25 DIAGNOSIS — Z79890 Hormone replacement therapy (postmenopausal): Secondary | ICD-10-CM | POA: Diagnosis not present

## 2023-05-25 DIAGNOSIS — Z87891 Personal history of nicotine dependence: Secondary | ICD-10-CM | POA: Diagnosis not present

## 2023-05-25 DIAGNOSIS — Z8673 Personal history of transient ischemic attack (TIA), and cerebral infarction without residual deficits: Secondary | ICD-10-CM

## 2023-05-25 LAB — CBC WITH DIFFERENTIAL/PLATELET
Abs Immature Granulocytes: 0.21 10*3/uL — ABNORMAL HIGH (ref 0.00–0.07)
Basophils Absolute: 0 10*3/uL (ref 0.0–0.1)
Basophils Relative: 0 %
Eosinophils Absolute: 0.1 10*3/uL (ref 0.0–0.5)
Eosinophils Relative: 1 %
HCT: 29.7 % — ABNORMAL LOW (ref 36.0–46.0)
Hemoglobin: 9.6 g/dL — ABNORMAL LOW (ref 12.0–15.0)
Immature Granulocytes: 2 %
Lymphocytes Relative: 9 %
Lymphs Abs: 1 10*3/uL (ref 0.7–4.0)
MCH: 27.4 pg (ref 26.0–34.0)
MCHC: 32.3 g/dL (ref 30.0–36.0)
MCV: 84.6 fL (ref 80.0–100.0)
Monocytes Absolute: 0.5 10*3/uL (ref 0.1–1.0)
Monocytes Relative: 4 %
Neutro Abs: 10.3 10*3/uL — ABNORMAL HIGH (ref 1.7–7.7)
Neutrophils Relative %: 84 %
Platelets: 153 10*3/uL (ref 150–400)
RBC: 3.51 MIL/uL — ABNORMAL LOW (ref 3.87–5.11)
RDW: 15.7 % — ABNORMAL HIGH (ref 11.5–15.5)
WBC: 12.1 10*3/uL — ABNORMAL HIGH (ref 4.0–10.5)
nRBC: 0 % (ref 0.0–0.2)

## 2023-05-25 MED ORDER — SODIUM CHLORIDE 0.9 % IV BOLUS
1000.0000 mL | Freq: Once | INTRAVENOUS | Status: AC
  Administered 2023-05-26: 1000 mL via INTRAVENOUS

## 2023-05-25 NOTE — ED Provider Notes (Signed)
[place] EMERGENCY DEPARTMENT AT [facility] Provider Note   CSN: [id number] Arrival date & time: 05/25/23  2242     History {Add pertinent medical, surgical, social history, OB history to HPI:1} Chief Complaint  Patient presents with   Back Pain    Samantha Fletcher is a 77 y.o. female.  HPI     Home Medications Prior to Admission medications   Medication Sig Start Date End Date Taking? Authorizing Provider  albuterol (PROVENTIL) (2.5 MG/3ML) 0.083% nebulizer solution Take 3 mLs (2.5 mg total) by nebulization every 4 (four) hours as needed for wheezing. 04/15/23   Elgergawy, Leana Roe, MD  amLODipine (NORVASC) 5 MG tablet Take 1 tablet (5 mg total) by mouth daily. 04/15/23   Elgergawy, Leana Roe, MD  aspirin EC 81 MG tablet Take 81 mg by mouth as directed. 3 times a week Patient not taking: Reported on 04/13/2023    [provider]  docusate sodium (STOOL SOFTENER) 100 MG capsule Take 100 mg by mouth as needed for mild constipation.    [provider]  donepezil (ARICEPT) 5 MG tablet Take 5 mg by mouth daily with breakfast. Patient not taking: Reported on 04/13/2023    [provider]  DULoxetine (CYMBALTA) 60 MG capsule Take 60 mg by mouth daily. Patient not taking: Reported on 04/13/2023 09/24/10   [provider]  feeding supplement (ENSURE ENLIVE / ENSURE PLUS) LIQD Take 237 mLs by mouth 2 (two) times daily between meals. 04/15/23   Elgergawy, Leana Roe, MD  levothyroxine (SYNTHROID) 75 MCG tablet Take by mouth. 03/04/17   [provider]  losartan (COZAAR) 100 MG tablet Take 1 tablet (100 mg total) by mouth daily. 04/15/23   Elgergawy, Leana Roe, MD  pantoprazole (PROTONIX) 40 MG tablet Take 1 tablet (40 mg total) by mouth 2 (two) times daily. Patient not taking: Reported on 04/13/2023 04/07/20   Lynann Bologna, MD  rosuvastatin (CRESTOR) 10 MG tablet Take 10 mg by mouth daily. Patient not taking: Reported on 04/13/2023    [provider]      Allergies    Celecoxib, Gabapentin, Nabumetone, Naproxen, Tramadol, Lyrica [pregabalin], and Buspar [buspirone]    Review of Systems   Review of Systems  Physical Exam Updated Vital Signs BP 137/74 (BP Location: Left Arm)   Pulse (!) 111   Temp 98.1 F (36.7 C) (Oral)   Resp (!) 22   LMP  (LMP Unknown)   SpO2 98%  Physical Exam  ED Results / Procedures / Treatments   Labs (all labs ordered are listed, but only abnormal results are displayed) Labs Reviewed  URINALYSIS, ROUTINE W REFLEX MICROSCOPIC  CBC WITH DIFFERENTIAL/PLATELET  COMPREHENSIVE METABOLIC PANEL  LIPASE, BLOOD  TROPONIN I (HIGH SENSITIVITY)    EKG EKG Interpretation Date/Time:  Sunday May 25 2023 23:06:52 EDT Ventricular Rate:  60 PR Interval:  162 QRS Duration:  100 QT Interval:  464 QTC Calculation: 464 R Axis:   55  Text Interpretation: Sinus rhythm Abnormal R-wave progression, early transition No significant change was found Confirmed by Glynn Octave [phone]) on 05/25/2023 11:21:48 PM  Radiology No results found.  Procedures Procedures  {Document cardiac monitor, telemetry assessment procedure when appropriate:1}  Medications Ordered in ED Medications - No data to display  ED Course/ Medical Decision Making/ A&P   {   Click here for ABCD2, HEART and other calculatorsREFRESH Note before signing :1}  Medical Decision Making Amount and/or Complexity of Data Reviewed Labs: ordered. ECG/medicine tests: ordered.   ***  {Document critical care time when appropriate:1} {Document review of labs and clinical decision tools ie heart score, Chads2Vasc2 etc:1}  {Document your independent review of radiology images, and any outside records:1} {Document your discussion with family members, caretakers, and with consultants:1} {Document social determinants of health affecting pt's care:1} {Document your decision making why or why not admission,  treatments were needed:1} Final Clinical Impression(s) / ED Diagnoses Final diagnoses:  None    Rx / DC Orders ED Discharge Orders     None

## 2023-05-25 NOTE — ED Triage Notes (Signed)
Pt is coming in for back pain, and abd pain. Recent Dx of a UTI, 6 to 7 days ago. Pt is non-cooperative in the room with allowing for medic to give report. Pt has been taking her oral medications for the UTI. Pt is endorsing lower back pain, abd pain, but also insist on having a broken right arm, hip/leg pain and her " brain is gone ".   Medic vitals   150/73 65hr 99% ra 18 97.8 tympanic

## 2023-05-25 NOTE — ED Notes (Addendum)
Per patients sister Samantha was sitting on front porch screaming stating that her head wont work. Samantha Fletcher & Ox 3. Samantha reports pain in bladder, back, right arm since falling in Aug 23. Sister reports that Samantha has been evaluated by Neuro for patients complaint of "head wont work".

## 2023-05-26 ENCOUNTER — Emergency Department (HOSPITAL_COMMUNITY): Payer: Medicare HMO

## 2023-05-26 ENCOUNTER — Inpatient Hospital Stay (HOSPITAL_COMMUNITY): Payer: Medicare HMO

## 2023-05-26 ENCOUNTER — Encounter (HOSPITAL_COMMUNITY): Payer: Self-pay | Admitting: Internal Medicine

## 2023-05-26 DIAGNOSIS — E039 Hypothyroidism, unspecified: Secondary | ICD-10-CM | POA: Diagnosis present

## 2023-05-26 DIAGNOSIS — D72829 Elevated white blood cell count, unspecified: Secondary | ICD-10-CM | POA: Diagnosis not present

## 2023-05-26 DIAGNOSIS — R7989 Other specified abnormal findings of blood chemistry: Secondary | ICD-10-CM

## 2023-05-26 DIAGNOSIS — D509 Iron deficiency anemia, unspecified: Secondary | ICD-10-CM | POA: Diagnosis present

## 2023-05-26 DIAGNOSIS — Z79890 Hormone replacement therapy (postmenopausal): Secondary | ICD-10-CM | POA: Diagnosis not present

## 2023-05-26 DIAGNOSIS — I120 Hypertensive chronic kidney disease with stage 5 chronic kidney disease or end stage renal disease: Secondary | ICD-10-CM | POA: Diagnosis not present

## 2023-05-26 DIAGNOSIS — N170 Acute kidney failure with tubular necrosis: Principal | ICD-10-CM

## 2023-05-26 DIAGNOSIS — J449 Chronic obstructive pulmonary disease, unspecified: Secondary | ICD-10-CM

## 2023-05-26 DIAGNOSIS — J441 Chronic obstructive pulmonary disease with (acute) exacerbation: Secondary | ICD-10-CM | POA: Diagnosis not present

## 2023-05-26 DIAGNOSIS — J9601 Acute respiratory failure with hypoxia: Secondary | ICD-10-CM | POA: Diagnosis not present

## 2023-05-26 DIAGNOSIS — E785 Hyperlipidemia, unspecified: Secondary | ICD-10-CM | POA: Diagnosis present

## 2023-05-26 DIAGNOSIS — E871 Hypo-osmolality and hyponatremia: Secondary | ICD-10-CM | POA: Diagnosis not present

## 2023-05-26 DIAGNOSIS — N186 End stage renal disease: Secondary | ICD-10-CM | POA: Diagnosis present

## 2023-05-26 DIAGNOSIS — I10 Essential (primary) hypertension: Secondary | ICD-10-CM

## 2023-05-26 DIAGNOSIS — Z79899 Other long term (current) drug therapy: Secondary | ICD-10-CM | POA: Diagnosis not present

## 2023-05-26 DIAGNOSIS — Z7189 Other specified counseling: Secondary | ICD-10-CM | POA: Diagnosis not present

## 2023-05-26 DIAGNOSIS — D649 Anemia, unspecified: Secondary | ICD-10-CM | POA: Diagnosis not present

## 2023-05-26 DIAGNOSIS — N179 Acute kidney failure, unspecified: Secondary | ICD-10-CM | POA: Diagnosis present

## 2023-05-26 DIAGNOSIS — G894 Chronic pain syndrome: Secondary | ICD-10-CM | POA: Diagnosis present

## 2023-05-26 DIAGNOSIS — R34 Anuria and oliguria: Secondary | ICD-10-CM | POA: Diagnosis not present

## 2023-05-26 DIAGNOSIS — Z992 Dependence on renal dialysis: Secondary | ICD-10-CM | POA: Diagnosis not present

## 2023-05-26 DIAGNOSIS — J9811 Atelectasis: Secondary | ICD-10-CM | POA: Diagnosis present

## 2023-05-26 DIAGNOSIS — J811 Chronic pulmonary edema: Secondary | ICD-10-CM | POA: Diagnosis not present

## 2023-05-26 DIAGNOSIS — Z515 Encounter for palliative care: Secondary | ICD-10-CM | POA: Diagnosis not present

## 2023-05-26 DIAGNOSIS — J90 Pleural effusion, not elsewhere classified: Secondary | ICD-10-CM | POA: Diagnosis not present

## 2023-05-26 DIAGNOSIS — E872 Acidosis, unspecified: Secondary | ICD-10-CM | POA: Diagnosis not present

## 2023-05-26 DIAGNOSIS — Z66 Do not resuscitate: Secondary | ICD-10-CM | POA: Diagnosis not present

## 2023-05-26 DIAGNOSIS — M797 Fibromyalgia: Secondary | ICD-10-CM | POA: Diagnosis present

## 2023-05-26 DIAGNOSIS — J810 Acute pulmonary edema: Secondary | ICD-10-CM | POA: Diagnosis not present

## 2023-05-26 DIAGNOSIS — F17210 Nicotine dependence, cigarettes, uncomplicated: Secondary | ICD-10-CM | POA: Diagnosis present

## 2023-05-26 DIAGNOSIS — J690 Pneumonitis due to inhalation of food and vomit: Secondary | ICD-10-CM | POA: Diagnosis not present

## 2023-05-26 DIAGNOSIS — F32A Depression, unspecified: Secondary | ICD-10-CM | POA: Diagnosis present

## 2023-05-26 DIAGNOSIS — I480 Paroxysmal atrial fibrillation: Secondary | ICD-10-CM | POA: Diagnosis present

## 2023-05-26 DIAGNOSIS — E875 Hyperkalemia: Secondary | ICD-10-CM | POA: Diagnosis present

## 2023-05-26 LAB — CBC
HCT: 26.1 % — ABNORMAL LOW (ref 36.0–46.0)
Hemoglobin: 8.6 g/dL — ABNORMAL LOW (ref 12.0–15.0)
MCH: 27.3 pg (ref 26.0–34.0)
MCHC: 33 g/dL (ref 30.0–36.0)
MCV: 82.9 fL (ref 80.0–100.0)
Platelets: UNDETERMINED 10*3/uL (ref 150–400)
RBC: 3.15 MIL/uL — ABNORMAL LOW (ref 3.87–5.11)
RDW: 15.6 % — ABNORMAL HIGH (ref 11.5–15.5)
WBC: 10.1 10*3/uL (ref 4.0–10.5)
nRBC: 0 % (ref 0.0–0.2)

## 2023-05-26 LAB — I-STAT CHEM 8, ED
BUN: 106 mg/dL — ABNORMAL HIGH (ref 8–23)
Calcium, Ion: 1.05 mmol/L — ABNORMAL LOW (ref 1.15–1.40)
Chloride: 103 mmol/L (ref 98–111)
Creatinine, Ser: 8.7 mg/dL — ABNORMAL HIGH (ref 0.44–1.00)
Glucose, Bld: 89 mg/dL (ref 70–99)
HCT: 31 % — ABNORMAL LOW (ref 36.0–46.0)
Hemoglobin: 10.5 g/dL — ABNORMAL LOW (ref 12.0–15.0)
Potassium: 4.5 mmol/L (ref 3.5–5.1)
Sodium: 129 mmol/L — ABNORMAL LOW (ref 135–145)
TCO2: 18 mmol/L — ABNORMAL LOW (ref 22–32)

## 2023-05-26 LAB — COMPREHENSIVE METABOLIC PANEL
ALT: 19 U/L (ref 0–44)
AST: 30 U/L (ref 15–41)
Albumin: 3 g/dL — ABNORMAL LOW (ref 3.5–5.0)
Alkaline Phosphatase: 67 U/L (ref 38–126)
Anion gap: 20 — ABNORMAL HIGH (ref 5–15)
BUN: 96 mg/dL — ABNORMAL HIGH (ref 8–23)
CO2: 17 mmol/L — ABNORMAL LOW (ref 22–32)
Calcium: 8.4 mg/dL — ABNORMAL LOW (ref 8.9–10.3)
Chloride: 94 mmol/L — ABNORMAL LOW (ref 98–111)
Creatinine, Ser: 7.93 mg/dL — ABNORMAL HIGH (ref 0.44–1.00)
GFR, Estimated: 5 mL/min — ABNORMAL LOW (ref >60)
Glucose, Bld: 98 mg/dL (ref 70–99)
Potassium: 4.5 mmol/L (ref 3.5–5.1)
Sodium: 131 mmol/L — ABNORMAL LOW (ref 135–145)
Total Bilirubin: 0.5 mg/dL (ref 0.3–1.2)
Total Protein: 6.3 g/dL — ABNORMAL LOW (ref 6.5–8.1)

## 2023-05-26 LAB — BASIC METABOLIC PANEL
Anion gap: 19 — ABNORMAL HIGH (ref 5–15)
BUN: 92 mg/dL — ABNORMAL HIGH (ref 8–23)
CO2: 15 mmol/L — ABNORMAL LOW (ref 22–32)
Calcium: 7.7 mg/dL — ABNORMAL LOW (ref 8.9–10.3)
Chloride: 96 mmol/L — ABNORMAL LOW (ref 98–111)
Creatinine, Ser: 7.68 mg/dL — ABNORMAL HIGH (ref 0.44–1.00)
GFR, Estimated: 5 mL/min — ABNORMAL LOW (ref >60)
Glucose, Bld: 103 mg/dL — ABNORMAL HIGH (ref 70–99)
Potassium: 4.4 mmol/L (ref 3.5–5.1)
Sodium: 130 mmol/L — ABNORMAL LOW (ref 135–145)

## 2023-05-26 LAB — I-STAT VENOUS BLOOD GAS, ED
Acid-base deficit: 9 mmol/L — ABNORMAL HIGH (ref 0.0–2.0)
Bicarbonate: 16 mmol/L — ABNORMAL LOW (ref 20.0–28.0)
Calcium, Ion: 1 mmol/L — ABNORMAL LOW (ref 1.15–1.40)
HCT: 25 % — ABNORMAL LOW (ref 36.0–46.0)
Hemoglobin: 8.5 g/dL — ABNORMAL LOW (ref 12.0–15.0)
O2 Saturation: 38 %
Potassium: 4.4 mmol/L (ref 3.5–5.1)
Sodium: 130 mmol/L — ABNORMAL LOW (ref 135–145)
TCO2: 17 mmol/L — ABNORMAL LOW (ref 22–32)
pCO2, Ven: 29.9 mmHg — ABNORMAL LOW (ref 44–60)
pH, Ven: 7.337 (ref 7.25–7.43)
pO2, Ven: 23 mmHg — CL (ref 32–45)

## 2023-05-26 LAB — IRON AND TIBC
Iron: 43 ug/dL (ref 28–170)
Saturation Ratios: 21 % (ref 10.4–31.8)
TIBC: 207 ug/dL — ABNORMAL LOW (ref 250–450)
UIBC: 164 ug/dL

## 2023-05-26 LAB — LACTIC ACID, PLASMA
Lactic Acid, Venous: 1 mmol/L (ref 0.5–1.9)
Lactic Acid, Venous: 1.4 mmol/L (ref 0.5–1.9)

## 2023-05-26 LAB — LIPASE, BLOOD: Lipase: 44 U/L (ref 11–51)

## 2023-05-26 LAB — TROPONIN I (HIGH SENSITIVITY)
Troponin I (High Sensitivity): 464 ng/L (ref <18)
Troponin I (High Sensitivity): 441 ng/L (ref <18)
Troponin I (High Sensitivity): 411 ng/L (ref <18)
Troponin I (High Sensitivity): 363 ng/L (ref <18)

## 2023-05-26 LAB — HEPARIN LEVEL (UNFRACTIONATED)
Heparin Unfractionated: <0.1 IU/mL — ABNORMAL LOW (ref 0.30–0.70)
Heparin Unfractionated: <0.1 IU/mL — ABNORMAL LOW (ref 0.30–0.70)

## 2023-05-26 LAB — BRAIN NATRIURETIC PEPTIDE: B Natriuretic Peptide: 1024.7 pg/mL — ABNORMAL HIGH (ref 0.0–100.0)

## 2023-05-26 MED ORDER — HYDROXYZINE HCL 10 MG PO TABS: 10.0000 mg | ORAL_TABLET | Freq: Once | ORAL | Status: DC

## 2023-05-26 MED ORDER — LIDOCAINE HCL URETHRAL/MUCOSAL 2 % EX GEL
1.0000 | Freq: Once | CUTANEOUS | Status: AC
  Administered 2023-05-26: 1 via URETHRAL
  Filled 2023-05-26 (×2): qty 6

## 2023-05-26 MED ORDER — HEPARIN BOLUS VIA INFUSION
1300.0000 [IU] | Freq: Once | INTRAVENOUS | Status: AC
  Administered 2023-05-26: 1300 [IU] via INTRAVENOUS
  Filled 2023-05-26: qty 1300

## 2023-05-26 MED ORDER — LACTATED RINGERS IV SOLN: INTRAVENOUS | Status: DC

## 2023-05-26 MED ORDER — ONDANSETRON HCL 4 MG/2ML IJ SOLN
4.0000 mg | Freq: Once | INTRAMUSCULAR | Status: AC
  Administered 2023-05-26: 4 mg via INTRAVENOUS
  Filled 2023-05-26: qty 2

## 2023-05-26 MED ORDER — ALBUTEROL SULFATE (2.5 MG/3ML) 0.083% IN NEBU
2.5000 mg | INHALATION_SOLUTION | RESPIRATORY_TRACT | Status: DC | PRN
  Administered 2023-05-27 – 2023-05-29 (×4): 2.5 mg via RESPIRATORY_TRACT
  Filled 2023-05-26 (×4): qty 3

## 2023-05-26 MED ORDER — LIDOCAINE 5 % EX PTCH
1.0000 | MEDICATED_PATCH | CUTANEOUS | Status: DC
  Administered 2023-05-26 – 2023-06-08 (×11): 1 via TRANSDERMAL
  Filled 2023-05-26 (×13): qty 1

## 2023-05-26 MED ORDER — ACETAMINOPHEN 500 MG PO TABS
1000.0000 mg | ORAL_TABLET | Freq: Three times a day (TID) | ORAL | Status: DC
  Administered 2023-05-26 – 2023-06-04 (×23): 1000 mg via ORAL
  Filled 2023-05-26 (×26): qty 2

## 2023-05-26 MED ORDER — ACETAMINOPHEN 500 MG PO TABS
1000.0000 mg | ORAL_TABLET | Freq: Four times a day (QID) | ORAL | Status: DC | PRN
  Administered 2023-05-26: 1000 mg via ORAL
  Filled 2023-05-26: qty 2

## 2023-05-26 MED ORDER — LACTATED RINGERS IV BOLUS
1000.0000 mL | Freq: Once | INTRAVENOUS | Status: AC
  Administered 2023-05-26: 1000 mL via INTRAVENOUS

## 2023-05-26 MED ORDER — HEPARIN BOLUS VIA INFUSION
2000.0000 [IU] | Freq: Once | INTRAVENOUS | Status: AC
  Administered 2023-05-26: 2000 [IU] via INTRAVENOUS
  Filled 2023-05-26: qty 2000

## 2023-05-26 MED ORDER — CHLORHEXIDINE GLUCONATE CLOTH 2 % EX PADS
6.0000 | MEDICATED_PAD | Freq: Every day | CUTANEOUS | Status: DC
  Administered 2023-05-27 – 2023-06-09 (×11): 6 via TOPICAL

## 2023-05-26 MED ORDER — HYDROMORPHONE HCL 2 MG PO TABS
1.0000 mg | ORAL_TABLET | Freq: Four times a day (QID) | ORAL | Status: DC | PRN
  Administered 2023-05-26 – 2023-06-09 (×14): 1 mg via ORAL
  Filled 2023-05-26 (×14): qty 1

## 2023-05-26 MED ORDER — HEPARIN (PORCINE) 25000 UT/250ML-% IV SOLN
950.0000 [IU]/h | INTRAVENOUS | Status: DC
  Administered 2023-05-26: 500 [IU]/h via INTRAVENOUS
  Administered 2023-05-27: 950 [IU]/h via INTRAVENOUS
  Filled 2023-05-26 (×2): qty 250

## 2023-05-26 MED ORDER — HYDROCORTISONE 1 % EX CREA
TOPICAL_CREAM | Freq: Two times a day (BID) | CUTANEOUS | Status: DC
  Administered 2023-06-03 – 2023-06-07 (×3): 1 via TOPICAL
  Filled 2023-05-26: qty 28
  Filled 2023-05-26: qty 28.35

## 2023-05-26 MED ORDER — PHENOL 1.4 % MT LIQD
1.0000 | OROMUCOSAL | Status: DC | PRN
  Administered 2023-05-26: 1 via OROMUCOSAL
  Filled 2023-05-26: qty 177

## 2023-05-26 MED ORDER — MORPHINE SULFATE (PF) 4 MG/ML IV SOLN
4.0000 mg | Freq: Once | INTRAVENOUS | Status: AC
  Administered 2023-05-26: 4 mg via INTRAVENOUS
  Filled 2023-05-26: qty 1
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
## 2023-05-26 NOTE — Progress Notes (Deleted)
Mobility Specialist Progress Note:    05/26/23 1003  Mobility  Activity Transferred to/from [facility]  Level of Assistance Minimal assist, patient does 75% or more  Assistive Device Other (Comment) (HHA)  Activity Response Tolerated well  Mobility Referral Yes  $Mobility charge 1 Mobility  Mobility Specialist Start Time (ACUTE ONLY) [id number]  Mobility Specialist Stop Time (ACUTE ONLY) [id number]  Mobility Specialist Time Calculation (min) (ACUTE ONLY) 9 min   Pt found transitioningto BSC, bed alarm going off. MS assisted her to [facility] MinA, HHA. Pt was able to have a BM, assisted in pericare. Pt assisted back to bed w/ call bell in hand, all needs met. Bed alarm is on.    Thompson Grayer Mobility Specialist  Please contact vis Secure Chat or  Rehab Office [phone]
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
## 2023-05-26 NOTE — Progress Notes (Signed)
Subjective: Samantha Fletcher is a 77 y.o. with a pertinent PMH of HTN, COPD, paroxysmal AF, chronic pain, and recurring UTIs who presented on 6/30 with back, abdominal and chest pain. She stated the chest pain was central and dull and had never experienced that kind of pain before. Also endorsed back pain (near kidneys) and chronic shortness of breath with activity. Patient was diagnosed with UTI 3 days prior and started on macrobid (taken for 4 days), which was switched to ciprofloxacin due to N/V. Has experienced decreased urinary output and dysuria since the onset of her UTI and can't remember the last time she successfully voided.   This morning the patient endorsed pain around her bladder, especially since foley placement, which she described as " fire coming out of me". Has been using clotrimazole cream around urethra at home. Has been taking her Tylenol dose for arthritis sporadically for pain. tried tylenol arthritis. Endorsed right hip pain, denied any chest pain. Limited improvement of nausea on Zofran PRN. Is drinking fluids but has had decreased appetite at home and on admission. Expressed concern about feeling like her head is "full" and "fuzzy" but patient was oriented to self, date, place, and situation. Chronic constipation, had one small BM yesterday. Experiences some SOB with exertion. Not concerned about cardiac causes, has been worked up by cardiologist in [place]. Lives at home with son (not present in room) and continues to smoke cigarettes. Does not drink alcohol or use illicit drugs.   Objective:  Vital signs in last 24 hours: Vitals:   05/26/23 0245 05/26/23 0341 05/26/23 0725 05/26/23 1119  BP: 139/61 (!) 141/61 (!) 139/57 117/67  Pulse: 82 62 61 61  Resp: 19 20 16 20   Temp:  98.2 F (36.8 C) 98.6 F (37 C) 98.5 F (36.9 C)  TempSrc:  Oral Oral Oral  SpO2: 98% 97% 98% 94%  Weight:      Height:          Latest Ref Rng & Units 05/26/2023    8:19 AM 05/26/2023    1:21 AM  05/26/2023   12:03 AM  CBC  WBC 4.0 - 10.5 K/uL 10.1     Hemoglobin 12.0 - 15.0 g/dL 8.6  8.5  16.1   Hematocrit 36.0 - 46.0 % 26.1  25.0  31.0   Platelets 150 - 400 K/uL PLATELET CLUMPS NOTED ON SMEAR, UNABLE TO ESTIMATE          Latest Ref Rng & Units 05/26/2023    8:19 AM 05/26/2023    1:21 AM 05/26/2023   12:03 AM  CMP  Glucose 70 - 99 mg/dL 096   89   BUN 8 - 23 mg/dL 92   045   Creatinine 4.09 - 1.00 mg/dL 8.11   9.14   Sodium 782 - 145 mmol/L 130  130  129   Potassium 3.5 - 5.1 mmol/L 4.4  4.4  4.5   Chloride 98 - 111 mmol/L 96   103   CO2 22 - 32 mmol/L 15     Calcium 8.9 - 10.3 mg/dL 7.7      BNP (last 3 results) Recent Labs    05/26/23 0114  BNP 1,024.7*   Troponin: 441 down from 464 on admission Anion gap: 20  Lactic acid: 1.4  Imaging: no new imaging reviewed since admission, refer to H&P   Physical Exam:  General: Resting in bed in slight distress due to pain CV: RRR, no murmurs, rubs, or gallops, capillary refill  B/UE WNL  Pulmonary/chest: Lungs clear bilaterally to auscultation, normal work of breathing  Abdominal: positive bowel sounds, soft with abdominal and suprapubic tenderness on palpation Neurological: Alert & oriented x 4 Skin:warm and dry   Assessment/Plan:  Principal Problem:   Acute renal failure (ARF) (HCC)  Samantha Fletcher is a 77 y.o. with a history of COPD, HTN, chronic pain, and recurrent UTIs who presented with chest pain and admitted for acute renal failure.   #Acute Renal Failure #AGMA Patient presenting with decreased urine output and dysuria in the setting of UTI treated with macrobid then ciprofloxacin. Creatinine found to be 7.93 on admission, highest 8.7 and most current value of 7.68. Given 3 (1L) boluses with no urine output. Foley placed for strict I/Os. Bladder scans on admission and 7/1 found no fluid retention. CT abdomen and renal US do not indicate obstruction as a cause for anuria. Initial nephrology consult on admission did  not indicate an immediate need for dialysis as electrolytes are WNL and she is not showing sings of altered mentation. Unable to obtain UA or urine studies due to anuria so re-consulted nephrology again today to further evaluate and manage her AKI. Dr. Arrie Aran with nephrology finds ARF to be most consistent with ischemic ATN in the setting of N/V with concomitant ARB therapy (losartan) and possible NG/vasculitis. Initiated a serological workup and recommended continuing to hold home losartan as well as continuing IVFs. Also noted AGMA, recommended continued use of LR and switch to isotonic bicarb if no improvement. No urgent indication for dialysis and nephrology will continue to follow (refer to consult note 7/1 for additional recommendations).   Plan:  - Trend Creatinine  - Monitor urine output  - Serial volume status exams - IV LR continuous  - F/U UA - F/U serologies and nephrology recommendations - Lidocaine 2% jelly urethral one time application to determine if it helps with localized pain    #Tropinemia  Pt complaining of chest pain, found to have troponin level of 464 on admission, down to 363. Could be in the setting of acute renal failure given decreased clearance of troponins. EKG reassuring with no ST elevations, ST depressions, or T wave inversions. Currently, pt denies any chest pain, shortness of breath, or vomiting. Continues to have some nausea but it is associated with eating and Zofran was found to be helpful.TIMI score of 4. Attempted echocardiogram but patient refused as she has been worked up by cardiology in the past and CP has improved. Will continue to monitor.    Plan:  - Trend troponins   #HTN Holding home losartan in the setting of ARF   #COPD Pt has not had formal diagnosis with PFTs, only uses albuterol inhaler PRN.    #Hypothyroidism  On Levothyroxine    #Normocytic Anemia  Pt with hemoglobin of 9.6 on admission, 8.6 today. Previous admission showed  work up with normal B12, folic acid, cortisol, and TSH to evaluate for generalized weakness and fatigue. Does have a history of iron deficiency, with last iron panel done one year ago.    Plan:  - Trend Hb   Diet: Clear Liquid VTE: Heparin IVF: LR,Bolus Code: DNR    Prior to Admission Living Arrangement: Home, living with son Anticipated Discharge Location: Home Barriers to Discharge: Medical management Dispo: Anticipated discharge greater than two nights.    Philomena Doheny, MD, PGY-1 05/26/2023, 12:41 PM Pager: @MYPAGER @ After 5pm on weekdays and 1pm on weekends: On Call pager [phone]

## 2023-05-26 NOTE — ED Notes (Signed)
Bladder scan 0

## 2023-05-26 NOTE — Progress Notes (Signed)
Samantha Fletcher refusing cardiac echo. Explained necessity of diagnostic procedures, patient stating: "there is nothing wrong with my heart and I don't even want to be treated, if I die then just let me die."

## 2023-05-26 NOTE — Hospital Course (Addendum)
Summary:  Samantha Fletcher is a 77 y.o. with a pertinent past medical history of HTN, suspected COPD, paroxysmal atrial fibrillation, chronic pain, and recurrent UTIs who was admitted for AKI with anuria on 05/25/23 likely secondary to ischemic ATN. She has been treated by the IMTS service for AKI, COPD exacerbation, and concern for infection in the lungs or HD catheter. Nephrology has been following and started patient on HD sessions.    AKI with oliguria Ischemic ATN Acute hypoxemic respiratory failure, improved Nephrology started patient on HD for her first session on 7/04 which was well tolerated. Patient has had six HD sessions as of 7/13. Patient on Lokelma 10 mg daily, held 7/12 for K 3.9 before HD session. Patient producing urine with external catheter with 150-300 ccs of daily output. No urine retention on bladder scans. Renal navigator confirmed services at [facility] Tuesdays, Thursdays, and Saturdays, but family reported some difficulties with transport. Son Mellody Dance has been helping to transport patient. Per SW, patient did not qualify for SNF and initially turned down [facility] services. Patient changed her mind over the length of the stay and accepted [facility] services, which have been confirmed by SW. Patient given phenergan 12.5 PRN for nausea due to concerns for prolonged QT with Zofran.    Aspiration pneumonia/leukocytosis  Possible line infection (r/o) COPD exacerbation  CXR 7/09 showing small layering pleural effusions on the left-greater-than-right with overlying atelectasis or consolidation, concerning for aspiration pneumonia. Clinically correlated with worsening cough, clear sputum, increased O2 need via nasal cannula, and WBC 10.7. Questionable lung fibrosis given previous scarring found on previous CXR in the context of suspected COPD with worsening cough, sputum, and shortness of breath. Augmentin 7/09- 7/14. Fevered 7/10, team concerned for aspiration pneumonia vs. line infection. Blood  culture with no growth in 4 days. E. Avium on microscopy report likely a contaminant. Patient remained on aspiration precautions. Augmentin stopped on 7/10 and patient changed to IV Azithromycin and Ceftriaxone. Patient changed to PO Azithromycin on 7/12. Azithromycin and Ceftriaxone therapy was completed on 7/15 (received a dose of each prior to discharge). Patient with 32 pack year smoking history, negative PFTs 2015, but given increased cough, sputum, and dyspnea suspect COPD exacerbation. Patient with marked improvement in breathing following Prednisone 40 mg (7/12-7/16), Benzonate 100 mg BID, Anoro Ellipta inhaler, and Albuterol inhaler PRN. PRN inhalers for home expensive, SW was unable to find medication assistance prior to discharge for patient to get Anoro or Breo Ellipta inhalers.     Normocytic anemia Hx iron deficiency anemia Patient transfused 1 unit PRBC 7/5/204 as hemoglobin was found to be < 7. No overt signs of bleeding per mouth or rectum on imaging or physical exam. Minimized blood draws, monitored for signs bleeding, and monitored hemoglobin. Hemoglobin on day of discharge 7.3 (7.3 -8.0 throughout stay). Patient denied weakness, lightheadedness, and confusion.   HTN Patient intermittently hypertensive. Holding home meds in setting of low normal, but given rising blood pressures will slowly restart home medications and continue to monitor vitals. Patient on amlodipine 5 mg at home. Nephrology increased to Amlodipine 10 mg on 7/10 as diastolic pressures remained elevated.   Chronic back pain Patient given acetaminophen 1000 mg PRN q6h, lidocaine 5% patch, and Dilaudid 1 mg PRN for severe pain. Lidocaine patch has been helpful.   Hypothyroidism Patient remained on levothyroxine 75 mcg daily. No changes to her home dose.    MDD/anxiety Per sister Darel Hong), patient on xanax at home (0.5 mg - 1 mg  daily) for anxiety. Started here on atarax 25 mg BID, mirtazapine 7.5 mg daily, and  venlafaxine 37.5 mg daily. Patient eating more and demonstrated increased interest in participating in therapy since starting mirtazapine and venlafaxine on 7/12. Patient stating medications have been helpful, especially with her anxiety.
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
## 2023-05-26 NOTE — ED Notes (Signed)
Patient transported to CT 

## 2023-05-26 NOTE — H&P (Addendum)
Date: 05/26/2023               Patient Name:  KARN BARONA MRN: [id number]  DOB: 1946/04/14 Age / Sex: 77 y.o., female   PCP: Marylen Ponto, MD         Medical Service: Internal Medicine Teaching Service         Attending Physician: Dr. Inez Catalina, MD      First Contact: Dr. Faith Rogue, DO Pager [phone]    Second Contact: Dr. Morene Crocker, MD Pager [phone]         After Hours (After 5p/  First Contact Pager: [phone]  weekends / holidays): Second Contact Pager: [phone]   SUBJECTIVE   Chief Complaint: chest Pain   History of Present Illness:   Ms. Taralyn Marmion is a 77 year old female with past medical history of HTN, COPD and chronic pain who presents with with back, abdominal, and chest pain. Her chest pain started today and has been on and off. She describes the pain as dull, and localized to the middle of her chest. It is not sharp or stabbing in nature. She has never had this kind of pain before. She does endorse being chronically short of breath with excessive movements, likely in the setting of COPD, her shortness of breath has not worsened today.   She does have a history of recurrent urinary tract infections. She was diagnosed with one three days ago and was started on macrobid, but started to feel ill with nausea and vomiting while taking it and was switched to ciprofloxacin.  She does state that she has back pain where her kidneys are. She also has had decreased urinary output since the onset of her most recent UTI, and continues to endorse dysuria.   Currently denies any chest pain, shortness of breath on exertion, nausea  ED Course: Troponins obtained for evaluation of chest pain were elevated to 464, CMP obtained showed an AKI of 7.93. She was given a NaCL bolus of , Zofran 4mg , and a morphine injection for her pain. She was also started on a heparin infusion. IMTS called for admission.     Meds:  Albuterol inhaler Amlodipine 5mg   Aspirin  81mg   Donepizel 5mg  Cymbalta 60mg  Synthroid Losartan 100mg  Prototonix 40mg  Crestor 10mg   Past Medical History  Past Surgical History:  Procedure Laterality Date   BACK SURGERY     COLONOSCOPY  04/08/2005   Colonic polyps status post polypectomy. Mild sigmoid diverticulosis. Internal hemorrhoids.   COLONOSCOPY     2017 [facility]   left eye cataract removal     with lens implant   ORIF RADIAL FRACTURE Right 10/07/2016   Procedure: OPEN TREATMENT OF GALEAZZI'S FRACTURE OF RIGHT RADIUS;  Surgeon: Mack Hook, MD;  Location: [facility] OR;  Service: Orthopedics;  Laterality: Right;  GENERAL ANESTHESIA WITH PRE-OP BLOCK   removal of paploma on tongue     right cataract removed     with lens implant   right knee mcl, lcl, acl     SPINAL CORD STIMULATOR INSERTION     TONSILLECTOMY AND ADENOIDECTOMY     TUBAL LIGATION     UPPER GASTROINTESTINAL ENDOSCOPY  05/16/2020   VAGINAL HYSTERECTOMY     VESICOVAGINAL FISTULA CLOSURE W/ TAH     YAG LASER APPLICATION      Social:  Lives With: Son at home Occupation: Retired Manufacturing systems engineer Support: Son at home, sister in room bedside  Level of Function:  Intermittently requires help with ADLs/IADLs PCP: Dr. Leonor Liv in [place] Substances: Currently smoking 1-2 cigarrettes a day, has a 32 pack year smoking history. Denies excessive alcohol use, or use of illicit substances.   Family History:  Mom: Hypertension  Dad: Passed away when she was 29 years old   Allergies: Allergies as of 05/25/2023 - Review Complete 05/25/2023  Allergen Reaction Noted   Celecoxib Other (See Comments)    Gabapentin Other (See Comments)    Nabumetone Other (See Comments)    Naproxen Other (See Comments)    Tramadol Other (See Comments)    Lyrica [pregabalin] Other (See Comments) 10/04/2016   Buspar [buspirone] Anxiety 10/04/2016    Review of Systems: A complete ROS was negative except as per HPI.   OBJECTIVE:   Physical  Exam: Blood pressure 136/79, pulse 62, temperature 98.1 F (36.7 C), temperature source Oral, resp. rate (!) 23, height 5\' 2"  (1.575 m), weight 44.2 kg, SpO2 100 %.  Constitutional: well-appearing female, laying in bed,  in no acute distress HENT: normocephalic atraumatic, mucous membranes moist Eyes: conjunctiva non-erythematous Neck: supple Cardiovascular: regular rate and rhythm, no m/r/g Pulmonary/Chest: normal work of breathing on room air, lungs clear to auscultation bilaterally Abdominal: soft, tender to palpation in the suprapubic area, normoactive bowel sounds MSK: normal bulk and tone Neurological: alert & oriented x 3, 5/5 strength in bilateral upper and lower extremities Skin: warm and dry Psych: Normal mood and affect  Labs: CBC    Component Value Date/Time   WBC 12.1 (H) 05/25/2023 2319   RBC 3.51 (L) 05/25/2023 2319   HGB 8.5 (L) 05/26/2023 0121   HCT 25.0 (L) 05/26/2023 0121   PLT 153 05/25/2023 2319   MCV 84.6 05/25/2023 2319   MCH 27.4 05/25/2023 2319   MCHC 32.3 05/25/2023 2319   RDW 15.7 (H) 05/25/2023 2319   LYMPHSABS 1.0 05/25/2023 2319   MONOABS 0.5 05/25/2023 2319   EOSABS 0.1 05/25/2023 2319   BASOSABS 0.0 05/25/2023 2319     CMP     Component Value Date/Time   NA 130 (L) 05/26/2023 0121   K 4.4 05/26/2023 0121   CL 103 05/26/2023 0003   CO2 17 (L) 05/25/2023 2319   GLUCOSE 89 05/26/2023 0003   BUN 106 (H) 05/26/2023 0003   CREATININE 8.70 (H) 05/26/2023 0003   CALCIUM 8.4 (L) 05/25/2023 2319   PROT 6.3 (L) 05/25/2023 2319   ALBUMIN 3.0 (L) 05/25/2023 2319   AST 30 05/25/2023 2319   ALT 19 05/25/2023 2319   ALKPHOS 67 05/25/2023 2319   BILITOT 0.5 05/25/2023 2319   GFRNONAA 5 (L) 05/25/2023 2319   GFRAA >60 10/10/2016 0556    Imaging: Chest X-Ray: Mild vascular congestion without edema , and mild scarring in the right lung base   CT Abdomen: No acute intra-abdominal pathology. Extensive multi-vessel coronary artery calcification    CT Head: Chronic atrophic and ischemic changes noted    EKG: personally reviewed my interpretation is sinus rhythm, no axis deviation, normal intervals, similar to prior EKG  ASSESSMENT & PLAN:   Assessment & Plan by Problem: Principal Problem:   Acute renal failure (ARF) (HCC)   LEVEE GARROD is a 77 y.o. person living with a history of COPD, HTN, depression who presented with chest pain and admitted for acute renal failure on hospital day 0  #Acute Renal Failure #AGMA Pt with decreased urine output over the last two day, as well as symptoms of dysuria. Creatinine found to be 7.93. Etiology  currently unclear; CT Abdomen Pelvis shows 2mm calcification in interpolar region of right kidney which could reflect vascular calcification. She did recently take nitrofurantoin for UTI, so could be acute interstitial nephritis secondary to macrobid. Will obtain renal ultrasound to rule out obstruction, and UA currently pending. Spoke with nephro and do not believe she needs immediate dialysis as electrolytes are within normal limits. No signs of uremia at this moment either. Bladder scan is 0, so pt not retaining. Of note, VBG shows appropriately compensating pCO2.   Plan:  - Trend Creatinine  - Monitor output  - F/U UA  - Renal ultrasound  - Will need to reconsult nephrology in the AM  #Tropinemia  Pt complaining of chest pain, found to have troponin level of 464, repeat 441. Could be in the setting of acute renal failure given decreased clearance of troponins. EKG reassuring with no ST elevations. Currently, pt denies any chest pain, shortness of breath, nausea, or vomiting. No ST depressions or T wave inversions on EKG. Chest pain not consistent with ischemic pain. TIMI score at 3. Will continue to monitor.   Plan:  - Trend troponins  #HTN Holding home losartan in the setting of ARF  #COPD Pt has not had formal diagnosis with PFTs, only uses albuterol inhaler PRN.   #Hypothyroidism   On Levothyroxine   #Normocytic Anemia  Pt with hemoglobin of 9.6. Previous admission showed work up with normal B12, folic acid, cortisol, and TSH to evaluate for generalized weakness and fatigue. Does have a history of iron deficiency, with last iron panel done one year ago.   Plan:  - Trend Hb   Diet: Clear Liquid VTE: Heparin IVF: LR,Bolus Code: DNR  Prior to Admission Living Arrangement: Home, living with son Anticipated Discharge Location: Home Barriers to Discharge: Medical management  Dispo: Admit patient to Inpatient with expected length of stay greater than 2 midnights.  Signed: Olegario Messier, MD Internal Medicine Resident PGY-2  05/26/2023, 2:15 AM
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
## 2023-05-27 DIAGNOSIS — I10 Essential (primary) hypertension: Secondary | ICD-10-CM | POA: Diagnosis not present

## 2023-05-27 DIAGNOSIS — R7989 Other specified abnormal findings of blood chemistry: Secondary | ICD-10-CM | POA: Diagnosis not present

## 2023-05-27 DIAGNOSIS — J449 Chronic obstructive pulmonary disease, unspecified: Secondary | ICD-10-CM | POA: Diagnosis not present

## 2023-05-27 DIAGNOSIS — N170 Acute kidney failure with tubular necrosis: Secondary | ICD-10-CM | POA: Diagnosis not present

## 2023-05-27 LAB — RENAL FUNCTION PANEL
Albumin: 2.1 g/dL — ABNORMAL LOW (ref 3.5–5.0)
Albumin: 2.2 g/dL — ABNORMAL LOW (ref 3.5–5.0)
Albumin: 2.2 g/dL — ABNORMAL LOW (ref 3.5–5.0)
Anion gap: 12 (ref 5–15)
Anion gap: 11 (ref 5–15)
Anion gap: 13 (ref 5–15)
BUN: 86 mg/dL — ABNORMAL HIGH (ref 8–23)
BUN: 86 mg/dL — ABNORMAL HIGH (ref 8–23)
BUN: 84 mg/dL — ABNORMAL HIGH (ref 8–23)
CO2: 17 mmol/L — ABNORMAL LOW (ref 22–32)
CO2: 17 mmol/L — ABNORMAL LOW (ref 22–32)
CO2: 17 mmol/L — ABNORMAL LOW (ref 22–32)
Calcium: 7.5 mg/dL — ABNORMAL LOW (ref 8.9–10.3)
Calcium: 7.4 mg/dL — ABNORMAL LOW (ref 8.9–10.3)
Calcium: 7.3 mg/dL — ABNORMAL LOW (ref 8.9–10.3)
Chloride: 99 mmol/L (ref 98–111)
Chloride: 98 mmol/L (ref 98–111)
Chloride: 97 mmol/L — ABNORMAL LOW (ref 98–111)
Creatinine, Ser: 7.75 mg/dL — ABNORMAL HIGH (ref 0.44–1.00)
Creatinine, Ser: 7.62 mg/dL — ABNORMAL HIGH (ref 0.44–1.00)
Creatinine, Ser: 7.79 mg/dL — ABNORMAL HIGH (ref 0.44–1.00)
GFR, Estimated: 5 mL/min — ABNORMAL LOW (ref >60)
GFR, Estimated: 5 mL/min — ABNORMAL LOW (ref >60)
GFR, Estimated: 5 mL/min — ABNORMAL LOW (ref >60)
Glucose, Bld: 89 mg/dL (ref 70–99)
Glucose, Bld: 90 mg/dL (ref 70–99)
Glucose, Bld: 89 mg/dL (ref 70–99)
Phosphorus: 4.8 mg/dL — ABNORMAL HIGH (ref 2.5–4.6)
Phosphorus: 4.8 mg/dL — ABNORMAL HIGH (ref 2.5–4.6)
Phosphorus: 5.1 mg/dL — ABNORMAL HIGH (ref 2.5–4.6)
Potassium: 5.7 mmol/L — ABNORMAL HIGH (ref 3.5–5.1)
Potassium: 5.5 mmol/L — ABNORMAL HIGH (ref 3.5–5.1)
Potassium: 5.2 mmol/L — ABNORMAL HIGH (ref 3.5–5.1)
Sodium: 128 mmol/L — ABNORMAL LOW (ref 135–145)
Sodium: 126 mmol/L — ABNORMAL LOW (ref 135–145)
Sodium: 127 mmol/L — ABNORMAL LOW (ref 135–145)

## 2023-05-27 LAB — EXTRACTABLE NUCLEAR ANTIGEN ANTIBODY
ENA SM Ab Ser-aCnc: <0.2 AI (ref 0.0–0.9)
Ribonucleic Protein: <0.2 AI (ref 0.0–0.9)
SSA (Ro) (ENA) Antibody, IgG: <0.2 AI (ref 0.0–0.9)
SSB (La) (ENA) Antibody, IgG: <0.2 AI (ref 0.0–0.9)
Scleroderma (Scl-70) (ENA) Antibody, IgG: <0.2 AI (ref 0.0–0.9)
ds DNA Ab: 1 [IU]/mL (ref 0–9)

## 2023-05-27 LAB — CBC
HCT: 24.9 % — ABNORMAL LOW (ref 36.0–46.0)
Hemoglobin: 8.1 g/dL — ABNORMAL LOW (ref 12.0–15.0)
MCH: 27.3 pg (ref 26.0–34.0)
MCHC: 32.5 g/dL (ref 30.0–36.0)
MCV: 83.8 fL (ref 80.0–100.0)
Platelets: 142 10*3/uL — ABNORMAL LOW (ref 150–400)
RBC: 2.97 MIL/uL — ABNORMAL LOW (ref 3.87–5.11)
RDW: 15.7 % — ABNORMAL HIGH (ref 11.5–15.5)
WBC: 10 10*3/uL (ref 4.0–10.5)
nRBC: 0 % (ref 0.0–0.2)

## 2023-05-27 LAB — ANCA TITERS
Atypical P-ANCA titer: <1:20 {titer}
C-ANCA: <1:20 {titer}
P-ANCA: <1:20 {titer}

## 2023-05-27 LAB — HEPARIN LEVEL (UNFRACTIONATED): Heparin Unfractionated: 0.17 IU/mL — ABNORMAL LOW (ref 0.30–0.70)

## 2023-05-27 LAB — ANTI-JO 1 ANTIBODY, IGG: Anti JO-1: <0.2 AI (ref 0.0–0.9)

## 2023-05-27 LAB — C3 COMPLEMENT: C3 Complement: 109 mg/dL (ref 82–167)

## 2023-05-27 LAB — C4 COMPLEMENT: Complement C4, Body Fluid: 19 mg/dL (ref 12–38)

## 2023-05-27 LAB — ANA W/REFLEX IF POSITIVE: Anti Nuclear Antibody (ANA): NEGATIVE

## 2023-05-27 LAB — ANTISTREPTOLYSIN O TITER: ASO: 51 [IU]/mL (ref 0.0–200.0)

## 2023-05-27 MED ORDER — ONDANSETRON HCL 4 MG/2ML IJ SOLN
4.0000 mg | Freq: Once | INTRAMUSCULAR | Status: AC
  Administered 2023-05-27: 4 mg via INTRAVENOUS
  Filled 2023-05-27: qty 2

## 2023-05-27 MED ORDER — SODIUM ZIRCONIUM CYCLOSILICATE 10 G PO PACK
10.0000 g | PACK | Freq: Two times a day (BID) | ORAL | Status: DC
  Administered 2023-05-27 – 2023-05-28 (×2): 10 g via ORAL
  Filled 2023-05-27 (×2): qty 1

## 2023-05-27 MED ORDER — FUROSEMIDE 10 MG/ML IJ SOLN
80.0000 mg | Freq: Once | INTRAMUSCULAR | Status: AC
  Administered 2023-05-27: 80 mg via INTRAVENOUS
  Filled 2023-05-27: qty 8

## 2023-05-27 MED ORDER — LACTATED RINGERS IV SOLN: INTRAVENOUS | Status: DC

## 2023-05-27 MED ORDER — HYDROXYZINE HCL 25 MG PO TABS
25.0000 mg | ORAL_TABLET | Freq: Two times a day (BID) | ORAL | Status: DC
  Administered 2023-05-27 – 2023-06-09 (×27): 25 mg via ORAL
  Filled 2023-05-27 (×28): qty 1

## 2023-05-27 MED ORDER — SODIUM ZIRCONIUM CYCLOSILICATE 5 G PO PACK
5.0000 g | PACK | Freq: Every day | ORAL | Status: DC
  Administered 2023-05-27: 5 g via ORAL
  Filled 2023-05-27: qty 1

## 2023-05-27 MED ORDER — STERILE WATER FOR INJECTION IV SOLN
INTRAVENOUS | Status: DC
  Filled 2023-05-27 (×4): qty 1000

## 2023-05-27 MED ORDER — LIDOCAINE HCL URETHRAL/MUCOSAL 2 % EX GEL
1.0000 | Freq: Once | CUTANEOUS | Status: DC
  Filled 2023-05-27: qty 6

## 2023-05-27 MED ORDER — LIDOCAINE HCL URETHRAL/MUCOSAL 2 % EX GEL
1.0000 | Freq: Every day | CUTANEOUS | Status: DC
  Administered 2023-05-27 – 2023-06-08 (×6): 1 via URETHRAL
  Filled 2023-05-27 (×14): qty 6

## 2023-05-27 MED ORDER — HEPARIN SODIUM (PORCINE) 5000 UNIT/ML IJ SOLN
5000.0000 [IU] | Freq: Three times a day (TID) | INTRAMUSCULAR | Status: DC
  Administered 2023-05-27 – 2023-06-09 (×37): 5000 [IU] via SUBCUTANEOUS
  Filled 2023-05-27 (×41): qty 1

## 2023-05-27 NOTE — Progress Notes (Addendum)
Subjective: Samantha Fletcher is a 77 y.o. with a pertinent PMH of HTN, COPD, paroxysmal AF, chronic pain, and recurring UTIs who presented on 6/30 with back, abdominal and chest pain and admitted to our service for acute renal failure.   No overnight events. This morning patient expressed continued pain "all over". Told overnight nurse urethral lidocaine jelly helped. Patient reported new cough at night. Able to answer and ask questions appropriately. While in the room, the bedside monitor alerted team to O2 sat 85-87% and a low BP. Patient remained aware and in no acute distress. Nasal canula was placed to provide supplemental O2.   Objective:  Vital signs in last 24 hours: Vitals:   05/26/23 0245 05/26/23 0341 05/26/23 0725 05/26/23 1119  BP: 139/61 (!) 141/61 (!) 139/57 117/67  Pulse: 82 62 61 61  Resp: 19 20 16 20   Temp:  98.2 F (36.8 C) 98.6 F (37 C) 98.5 F (36.9 C)  TempSrc:  Oral Oral Oral  SpO2: 98% 97% 98% 94%  Weight:      Height:          Latest Ref Rng & Units 05/27/2023   12:30 AM 05/26/2023    8:19 AM 05/26/2023    1:21 AM  CBC  WBC 4.0 - 10.5 K/uL 10.0  10.1    Hemoglobin 12.0 - 15.0 g/dL 8.1  8.6  8.5   Hematocrit 36.0 - 46.0 % 24.9  26.1  25.0   Platelets 150 - 400 K/uL 142  PLATELET CLUMPS NOTED ON SMEAR, UNABLE TO ESTIMATE    Renal Functional Panel          Component Ref Range & Units 00:30 (05/27/23) 1 d ago (05/26/23) 1 d ago (05/26/23) 1 d ago (05/26/23) 2 d ago (05/25/23) 1 mo ago (04/15/23) 1 mo ago (04/14/23)  Sodium 135 - 145 mmol/L 128 Low  130 Low  130 Low  129 Low  131 Low  133 Low  129 Low   Potassium 3.5 - 5.1 mmol/L 5.7 High  4.4 4.4 4.5 4.5 3.8 3.9  Chloride 98 - 111 mmol/L 99 96 Low   103 94 Low  100 97 Low   CO2 22 - 32 mmol/L 17 Low  15 Low    17 Low  22 21 Low   Glucose, Bld 70 - 99 mg/dL 89 782 High  CM  89 CM 98 CM 91 CM 90 CM  Comment: Glucose reference range applies only to samples taken after fasting for at least 8 hours.  BUN 8 - 23  mg/dL 86 High  92 High   956 High  96 High  12 13  Creatinine, Ser 0.44 - 1.00 mg/dL 2.13 High  0.86 High   8.70 High  7.93 High  0.76 0.77  Calcium 8.9 - 10.3 mg/dL 7.5 Low  7.7 Low    8.4 Low  8.8 Low  8.4 Low   Phosphorus 2.5 - 4.6 mg/dL 4.8 High         Albumin 3.5 - 5.0 g/dL 2.1 Low     3.0 Low     GFR, Estimated >60 mL/min 5 Low  5 Low  CM   5 Low  CM >60 CM >60 CM  Comment: (NOTE) Calculated using the CKD-EPI Creatinine Equation (2021)  Anion gap 5 - 15 12 19  High  CM   20 High  CM 11 CM 11 CM       Troponin: 363 down from 441 on 7/1 In/Outs:  On maintenance LR, output of 2-3 ccs of urine in foley bag during morning rounds, updated to 14 ccs of urine on chart   Imaging: no new imaging reviewed since admission, refer to H&P   Physical Exam:  General: Appeared uncomfortable in bed, otherwise cooperative with exam.  CV: RRR, no murmurs rubs or gallops. Capillary refill WNL.  Pulmonary/chest: Clear lungs bilaterally on auscultation. Normal respiratory effort.  Abdominal: Positive bowel sounds, soft, tender near lower abdomen.  Neurological: Alert and oriented to self, place, not situation. Seemed distracted by pain.  Skin: Warm and dry.  Assessment/Plan: BAR Samantha Fletcher is a 77 y.o. with a history of COPD, HTN, chronic pain, and recurrent UTIs who presented with chest and back pain. She was admitted to our service for acute renal failure.   #Acute Renal Failure #Hyperkalemia #AGMA - resolved (12) Per nephrology, ARF to be most consistent with ischemic ATN in the setting of N/V with concomitant ARB therapy (losartan) and possible NG/vasculitis. Initiated a serological workup and recommended continuing to hold home losartan as well as continuing IVFs. Also noted AGMA, recommended continued use of LR and switch to isotonic bicarb if no improvement. No urgent indication for dialysis and nephrology will continue to follow (refer to consult note 7/1 for additional recommendations).  Overnight team notified about pruritus, concerning for uremic symptoms. Appeared euvolemic on exam. Cr today 7.68, potassium 5.7. Stat EKG WNL. Bicarbonate 16. Anion gap 12. Approximately ~2-3 ccs of urine in foley bag during morning rounds. Re-consulted nephrology given oliguria, pruritus, and hyperkalemia. Dr. Arrie Aran with nephrology provided some recommendations and continues to follow.    Plan:  - Trend Creatinine  - Monitor urine output  - Serial volume status exams - IV LR continuous for maintenance, changed from 125 to 150; if bicarbonate is too elevated switch to isotonic saline  - F/U UA - F/U serologies - Lidocaine 2% jelly urethral scheduled daily- helped with localized pain - Lidocaine patch on back  - Dilaudid PRN q6 - Scheduled Tylenol q8h  - Lokelma given for K 5.7 - Repeat RFP in afternoon - f/u electrolyte values - Cortisone cream for pruritus  - Scheduled hydroxyzine/atarax 25 mg BID for pruritus    #Tropinemia  Pt complaining of chest pain, found to have troponin level of 464 on admission, down to 363. Could be in the setting of acute renal failure given decreased clearance of troponins. EKG reassuring with no ST elevations, ST depressions, or T wave inversions. Currently, pt denies any chest pain, shortness of breath, or vomiting. Continues to have some nausea but it is associated with eating and Zofran was found to be helpful.TIMI score of 4. Attempted echocardiogram but patient refused as she has been worked up by cardiology in the past and CP has improved. Improved to 363 on 7/2.   Plan:  Troponin trending down, will stop heparin ggt today and transition to SQ heparin for DVT ppx.    #HTN Holding home losartan in the setting of ARF   #COPD Pt has not had formal diagnosis with PFTs, only uses albuterol inhaler PRN.    #Hypothyroidism  On Levothyroxine    #Normocytic Anemia  Pt with hemoglobin of 9.6 on admission, 8.1 today. Previous admission showed  work up with normal B12, folic acid, cortisol, and TSH to evaluate for generalized weakness and fatigue. Does have a history of iron deficiency, with last iron panel done one year ago. Iron WNL 43, TIBC 207.    Plan:  -  Trend Hb   Diet: Clear Liquid VTE: Subcutaneous Heparin  IVF: LR maintenance fluids Code: DNR    Prior to Admission Living Arrangement: Home, living with son Anticipated Discharge Location: Home Barriers to Discharge: Medical management Dispo: Anticipated discharge greater than two nights.    Philomena Doheny, MD, PGY-1 05/26/2023, 12:41 PM After 5pm on weekdays and 1pm on weekends: On Call pager [phone]

## 2023-05-27 NOTE — Progress Notes (Signed)
Patient ID: Samantha Fletcher, female   DOB: 1946-01-05, 77 y.o.   MRN: [id number] S: Still complaining of abdominal pain.  Not much UOP over the last 24 hours. O:BP (!) 156/68 (BP Location: Right Arm)   Pulse 62   Temp 98.2 F (36.8 C) (Oral)   Resp 19   Ht 5\' 2"  (1.575 m)   Wt 57.3 kg   LMP  (LMP Unknown)   SpO2 97%   BMI 23.10 kg/m   Intake/Output Summary (Last 24 hours) at 05/27/2023 1149 Last data filed at 05/27/2023 0909 Gross per 24 hour  Intake 1066.93 ml  Output 14 ml  Net 1052.93 ml   Intake/Output: I/O last 3 completed shifts: In: 1385.3 [P.O.:385; IV Piggyback:1000.3] Out: 0   Intake/Output this shift:  Total I/O In: 706.9 [P.O.:480; I.V.:226.9] Out: 14 [Urine:14] Weight change: 13.1 kg Gen: NAD CVS: RRR Resp:CTA Abd: +BS, soft, diffusely tender to palpation, no guarding or rebound Ext: no edema  Recent Labs  Lab 05/25/23 2319 05/26/23 0003 05/26/23 0121 05/26/23 0819 05/27/23 0030 05/27/23 0705  NA 131* 129* 130* 130* 128* 126*  K 4.5 4.5 4.4 4.4 5.7* 5.5*  CL 94* 103  --  96* 99 98  CO2 17*  --   --  15* 17* 17*  GLUCOSE 98 89  --  103* 89 90  BUN 96* 106*  --  92* 86* 86*  CREATININE 7.93* 8.70*  --  7.68* 7.75* 7.62*  ALBUMIN 3.0*  --   --   --  2.1* 2.2*  CALCIUM 8.4*  --   --  7.7* 7.5* 7.4*  PHOS  --   --   --   --  4.8* 4.8*  AST 30  --   --   --   --   --   ALT 19  --   --   --   --   --    Liver Function Tests: Recent Labs  Lab 05/25/23 2319 05/27/23 0030 05/27/23 0705  AST 30  --   --   ALT 19  --   --   ALKPHOS 67  --   --   BILITOT 0.5  --   --   PROT 6.3*  --   --   ALBUMIN 3.0* 2.1* 2.2*   Recent Labs  Lab 05/25/23 2319  LIPASE 44   No results for input(s): "AMMONIA" in the last 168 hours. CBC: Recent Labs  Lab 05/25/23 2319 05/26/23 0003 05/26/23 0121 05/26/23 0819 05/27/23 0030  WBC 12.1*  --   --  10.1 10.0  NEUTROABS 10.3*  --   --   --   --   HGB 9.6*   < > 8.5* 8.6* 8.1*  HCT 29.7*   < > 25.0* 26.1* 24.9*   MCV 84.6  --   --  82.9 83.8  PLT 153  --   --  PLATELET CLUMPS NOTED ON SMEAR, UNABLE TO ESTIMATE 142*   < > = values in this interval not displayed.   Cardiac Enzymes: No results for input(s): "CKTOTAL", "CKMB", "CKMBINDEX", "TROPONINI" in the last 168 hours. CBG: No results for input(s): "GLUCAP" in the last 168 hours.  Iron Studies:  Recent Labs    05/26/23 1259  IRON 43  TIBC 207*   Studies/Results: US RENAL  Result Date: 05/26/2023 CLINICAL DATA:  77 year old female with acute renal failure. EXAM: RENAL / URINARY TRACT ULTRASOUND COMPLETE COMPARISON:  CT Abdomen and Pelvis 0034 hours today. FINDINGS: Right  Kidney: Renal measurements: 9.1 x 4.9 x 4.9 cm = volume: 114 mL. No right hydronephrosis. Maintained corticomedullary differentiation. No right renal mass. Left Kidney: Renal measurements: 9.6 x 5.0 x 5.3 cm = volume: 132 mL. Maintained corticomedullary differentiation. No hydronephrosis or renal mass. Bladder: Bladder decompressed with Foley catheter balloon visible on image 41. Other: None. IMPRESSION: 1. Negative ultrasound appearance of both kidneys. 2. Bladder decompressed by Foley catheter. Electronically Signed   By: Odessa Fleming M.D.   On: 05/26/2023 04:13   CT Head Wo Contrast  Result Date: 05/26/2023 CLINICAL DATA:  Altered mental status and UTI, initial encounter EXAM: CT HEAD WITHOUT CONTRAST TECHNIQUE: Contiguous axial images were obtained from the base of the skull through the vertex without intravenous contrast. RADIATION DOSE REDUCTION: This exam was performed according to the departmental dose-optimization program which includes automated exposure control, adjustment of the mA and/or kV according to patient size and/or use of iterative reconstruction technique. COMPARISON:  None Available. FINDINGS: Brain: No evidence of acute infarction, hemorrhage, hydrocephalus, extra-axial collection or mass lesion/mass effect. Chronic atrophic and ischemic changes are noted. Vascular:  No hyperdense vessel or unexpected calcification. Skull: Normal. Negative for fracture or focal lesion. Sinuses/Orbits: No acute finding. Other: None. IMPRESSION: Chronic atrophic and ischemic changes are noted. No acute abnormality noted. Electronically Signed   By: Alcide Clever M.D.   On: 05/26/2023 01:45   CT ABDOMEN PELVIS WO CONTRAST  Result Date: 05/26/2023 CLINICAL DATA:  Abdominal pain, acute, nonlocalized. Remote fall, persistent pelvic and back pain, EXAM: CT ABDOMEN AND PELVIS WITHOUT CONTRAST TECHNIQUE: Multidetector CT imaging of the abdomen and pelvis was performed following the standard protocol without IV contrast. RADIATION DOSE REDUCTION: This exam was performed according to the departmental dose-optimization program which includes automated exposure control, adjustment of the mA and/or kV according to patient size and/or use of iterative reconstruction technique. COMPARISON:  01/11/2023 FINDINGS: Lower chest: No acute abnormality. Extensive multi-vessel coronary artery calcification. Stable bibasilar fibrotic changes Hepatobiliary: No focal liver abnormality is seen. No gallstones, gallbladder wall thickening, or biliary dilatation. Pancreas: Unremarkable Spleen: Unremarkable Adrenals/Urinary Tract: The adrenal glands are unremarkable. The kidneys are normal in size and position. 2 mm calcification within the interpolar region of the right kidney likely represents a vascular calcification. No urinary renal or ureteral calculi identified. No hydronephrosis. No perinephric fluid collections. The bladder is decompressed with a punctate focus of intraluminal gas possibly related to recent catheterization. Stomach/Bowel: Moderate sigmoid diverticulosis without superimposed acute inflammatory change. The stomach, small bowel, and large bowel are otherwise unremarkable. The appendix is absent. No free intraperitoneal gas or fluid. Vascular/Lymphatic: Extensive aortoiliac atherosclerotic calcification.  No aortic aneurysm. No pathologic adenopathy within the abdomen and pelvis. Reproductive: Status post hysterectomy. No adnexal masses. Other: No abdominal wall hernia. Coarse calcifications within the subcutaneous fat of the gluteal regions bilaterally may relate to remote trauma or subcutaneous injection. Musculoskeletal: L4-5 lumbar fusion with instrumentation and left L4 hemilaminectomy have been performed. Advanced degenerative changes are seen throughout the visualized thoracolumbar spine. Dorsal column stimulator device is in place with leads extending into the thoracic spine beyond the margin of the examination. No acute bone abnormality. No lytic or blastic bone lesion. IMPRESSION: 1. No acute intra-abdominal pathology identified. No definite radiographic explanation for the patient's reported symptoms. 2. Extensive multi-vessel coronary artery calcification. 3. Moderate sigmoid diverticulosis without superimposed acute inflammatory change. Aortic Atherosclerosis (ICD10-I70.0). Electronically Signed   By: Helyn Numbers M.D.   On: 05/26/2023 01:09   DG Chest  Portable 1 View  Result Date: 05/26/2023 CLINICAL DATA:  Chest pain EXAM: PORTABLE CHEST 1 VIEW COMPARISON:  04/09/2023 FINDINGS: Cardiac shadow is stable. Spinal stimulator is again noted. Lungs are well aerated bilaterally. Some chronic scarring is seen particularly in the right base. Mild central vascular congestion is noted without edema. No bony abnormality is seen. IMPRESSION: Mild vascular congestion without edema. Mild scarring in the right lung base. Electronically Signed   By: Alcide Clever M.D.   On: 05/26/2023 00:24    acetaminophen  1,000 mg Oral Q8H   Chlorhexidine Gluconate Cloth  6 each Topical Q0600   heparin injection (subcutaneous)  5,000 Units Subcutaneous Q8H   hydrocortisone cream   Topical BID   hydrOXYzine  25 mg Oral BID   lidocaine  1 patch Transdermal Q24H   lidocaine  1 Application Urethral Daily   sodium zirconium  cyclosilicate  5 g Oral Daily    BMET    Component Value Date/Time   NA 126 (L) 05/27/2023 0705   K 5.5 (H) 05/27/2023 0705   CL 98 05/27/2023 0705   CO2 17 (L) 05/27/2023 0705   GLUCOSE 90 05/27/2023 0705   BUN 86 (H) 05/27/2023 0705   CREATININE 7.62 (H) 05/27/2023 0705   CALCIUM 7.4 (L) 05/27/2023 0705   GFRNONAA 5 (L) 05/27/2023 0705   GFRAA >60 10/10/2016 0556   CBC    Component Value Date/Time   WBC 10.0 05/27/2023 0030   RBC 2.97 (L) 05/27/2023 0030   HGB 8.1 (L) 05/27/2023 0030   HCT 24.9 (L) 05/27/2023 0030   PLT 142 (L) 05/27/2023 0030   MCV 83.8 05/27/2023 0030   MCH 27.3 05/27/2023 0030   MCHC 32.5 05/27/2023 0030   RDW 15.7 (H) 05/27/2023 0030   LYMPHSABS 1.0 05/25/2023 2319   MONOABS 0.5 05/25/2023 2319   EOSABS 0.1 05/25/2023 2319   BASOSABS 0.0 05/25/2023 2319    Assessment/Plan:  AKI, oliguric - by history this is most consistent with ischemic ATN in setting of N/V for the past week with concomitant ARB therapy. Unfortunately no urine to evaluate.  No evidence of cortical necrosis on renal US, no NSAID use, time course too short for AIN, no obstruction.  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  Negative ANA, dsDNA, ASO, normal complements.  ANCA pending.  Would continue with IVF's since no UOP.  Continue to hold losartan.  No urgent indication for dialysis at this time and will continue to follow closely.  Avoid nephrotoxic medications including NSAIDs and iodinated intravenous contrast exposure unless the latter is absolutely indicated.  Preferred narcotic agents for pain control are hydromorphone, fentanyl, and methadone. Morphine should not be used. Avoid Baclofen and avoid oral sodium phosphate and magnesium citrate based laxatives / bowel preps. Continue strict Input and Output monitoring. Will monitor the patient closely with you and intervene or adjust therapy as indicated by changes in clinical status/labs  AGMA - due to #1.  Will switch from lactated ringers to  isotonic bicarb due to hyperkalemia.  Hyperkalemia - given lokelma 5 mg x 1, will stop LR and change to isotonic bicarb.  Also increase dose of Lokelma to 10 grams.  Hyponatremia - again likely hypovolemic hyponatremia by history.  Unable to obtain urine sample at this time and will continue to follow.   Will check cortisol level in am to r/o adrenal insufficiency. Elevated troponin - with vague history of chest pain.  Per primary svc. HTN- low bp, losartan on hold due to AKI. Low back  pain - chronic Abdominal pain - CT of abd/pelvis without source of symptoms.  COPD - per primary svc Normocytic anemia - will check iron stores.  Transfuse if Hgb drops to 7.     Irena Cords, MD [facility]
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
## 2023-05-27 NOTE — Progress Notes (Signed)
Bladder scan performed on pt. 0mL detected by scanner, no output in catheter drainage bag. MD notified.
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
## 2023-05-28 ENCOUNTER — Inpatient Hospital Stay (HOSPITAL_COMMUNITY): Payer: Medicare HMO

## 2023-05-28 ENCOUNTER — Other Ambulatory Visit: Payer: Self-pay | Admitting: Student

## 2023-05-28 DIAGNOSIS — I10 Essential (primary) hypertension: Secondary | ICD-10-CM | POA: Diagnosis not present

## 2023-05-28 DIAGNOSIS — J449 Chronic obstructive pulmonary disease, unspecified: Secondary | ICD-10-CM | POA: Diagnosis not present

## 2023-05-28 DIAGNOSIS — E039 Hypothyroidism, unspecified: Secondary | ICD-10-CM | POA: Diagnosis not present

## 2023-05-28 DIAGNOSIS — N170 Acute kidney failure with tubular necrosis: Secondary | ICD-10-CM | POA: Diagnosis not present

## 2023-05-28 HISTORY — PX: IR FLUORO GUIDE CV LINE RIGHT: IMG2283

## 2023-05-28 HISTORY — PX: IR US GUIDE VASC ACCESS RIGHT: IMG2390

## 2023-05-28 LAB — COMPLEMENT, TOTAL: Compl, Total (CH50): 59 U/mL (ref >41)

## 2023-05-28 LAB — CBC
HCT: 21.3 % — ABNORMAL LOW (ref 36.0–46.0)
HCT: 22.7 % — ABNORMAL LOW (ref 36.0–46.0)
HCT: 25 % — ABNORMAL LOW (ref 36.0–46.0)
Hemoglobin: 7.1 g/dL — ABNORMAL LOW (ref 12.0–15.0)
Hemoglobin: 7.5 g/dL — ABNORMAL LOW (ref 12.0–15.0)
Hemoglobin: 8.4 g/dL — ABNORMAL LOW (ref 12.0–15.0)
MCH: 27.1 pg (ref 26.0–34.0)
MCH: 27.1 pg (ref 26.0–34.0)
MCH: 28.4 pg (ref 26.0–34.0)
MCHC: 33 g/dL (ref 30.0–36.0)
MCHC: 33.3 g/dL (ref 30.0–36.0)
MCHC: 33.6 g/dL (ref 30.0–36.0)
MCV: 81.3 fL (ref 80.0–100.0)
MCV: 81.9 fL (ref 80.0–100.0)
MCV: 84.5 fL (ref 80.0–100.0)
Platelets: 166 10*3/uL (ref 150–400)
Platelets: 190 10*3/uL (ref 150–400)
Platelets: 201 10*3/uL (ref 150–400)
RBC: 2.62 MIL/uL — ABNORMAL LOW (ref 3.87–5.11)
RBC: 2.77 MIL/uL — ABNORMAL LOW (ref 3.87–5.11)
RBC: 2.96 MIL/uL — ABNORMAL LOW (ref 3.87–5.11)
RDW: 15.3 % (ref 11.5–15.5)
RDW: 15.7 % — ABNORMAL HIGH (ref 11.5–15.5)
RDW: 15.8 % — ABNORMAL HIGH (ref 11.5–15.5)
WBC: 10.8 10*3/uL — ABNORMAL HIGH (ref 4.0–10.5)
WBC: 11 10*3/uL — ABNORMAL HIGH (ref 4.0–10.5)
WBC: 11.4 10*3/uL — ABNORMAL HIGH (ref 4.0–10.5)
nRBC: 0 % (ref 0.0–0.2)
nRBC: 0 % (ref 0.0–0.2)
nRBC: 0 % (ref 0.0–0.2)

## 2023-05-28 LAB — RENAL FUNCTION PANEL
Albumin: 2.1 g/dL — ABNORMAL LOW (ref 3.5–5.0)
Albumin: 2.1 g/dL — ABNORMAL LOW (ref 3.5–5.0)
Anion gap: 13 (ref 5–15)
Anion gap: 16 — ABNORMAL HIGH (ref 5–15)
BUN: 81 mg/dL — ABNORMAL HIGH (ref 8–23)
BUN: 85 mg/dL — ABNORMAL HIGH (ref 8–23)
CO2: 22 mmol/L (ref 22–32)
CO2: 25 mmol/L (ref 22–32)
Calcium: 7.3 mg/dL — ABNORMAL LOW (ref 8.9–10.3)
Calcium: 7.1 mg/dL — ABNORMAL LOW (ref 8.9–10.3)
Chloride: 91 mmol/L — ABNORMAL LOW (ref 98–111)
Chloride: 85 mmol/L — ABNORMAL LOW (ref 98–111)
Creatinine, Ser: 8 mg/dL — ABNORMAL HIGH (ref 0.44–1.00)
Creatinine, Ser: 8.28 mg/dL — ABNORMAL HIGH (ref 0.44–1.00)
GFR, Estimated: 5 mL/min — ABNORMAL LOW (ref >60)
GFR, Estimated: 5 mL/min — ABNORMAL LOW (ref >60)
Glucose, Bld: 91 mg/dL (ref 70–99)
Glucose, Bld: 99 mg/dL (ref 70–99)
Phosphorus: 5.4 mg/dL — ABNORMAL HIGH (ref 2.5–4.6)
Phosphorus: 6.5 mg/dL — ABNORMAL HIGH (ref 2.5–4.6)
Potassium: 4.9 mmol/L (ref 3.5–5.1)
Potassium: 5.1 mmol/L (ref 3.5–5.1)
Sodium: 126 mmol/L — ABNORMAL LOW (ref 135–145)
Sodium: 126 mmol/L — ABNORMAL LOW (ref 135–145)

## 2023-05-28 LAB — HEPATITIS B SURFACE ANTIGEN: Hepatitis B Surface Ag: NONREACTIVE

## 2023-05-28 MED ORDER — HEPARIN SODIUM (PORCINE) 1000 UNIT/ML IJ SOLN
3.2000 mL | Freq: Once | INTRAMUSCULAR | Status: AC
  Administered 2023-06-02: 3200 [IU] via INTRAVENOUS
  Filled 2023-05-28: qty 3.2

## 2023-05-28 MED ORDER — ANTICOAGULANT SODIUM CITRATE 4% (200MG/5ML) IV SOLN: 5.0000 mL | Status: DC | PRN

## 2023-05-28 MED ORDER — LIDOCAINE HCL 1 % IJ SOLN
INTRAMUSCULAR | Status: AC
  Filled 2023-05-28: qty 20

## 2023-05-28 MED ORDER — CHLORHEXIDINE GLUCONATE CLOTH 2 % EX PADS
6.0000 | MEDICATED_PAD | Freq: Every day | CUTANEOUS | Status: DC
  Administered 2023-05-28 – 2023-06-03 (×5): 6 via TOPICAL

## 2023-05-28 MED ORDER — SODIUM ZIRCONIUM CYCLOSILICATE 10 G PO PACK
10.0000 g | PACK | Freq: Every day | ORAL | Status: DC
  Administered 2023-05-29 – 2023-06-09 (×11): 10 g via ORAL
  Filled 2023-05-28 (×11): qty 1

## 2023-05-28 MED ORDER — ONDANSETRON HCL 4 MG/2ML IJ SOLN
INTRAMUSCULAR | Status: AC | PRN
  Administered 2023-05-28: 4 mg via INTRAVENOUS

## 2023-05-28 MED ORDER — ONDANSETRON HCL 4 MG PO TABS
4.0000 mg | ORAL_TABLET | Freq: Once | ORAL | Status: AC
  Administered 2023-05-29: 4 mg via ORAL
  Filled 2023-05-28 (×2): qty 1

## 2023-05-28 MED ORDER — CEFAZOLIN SODIUM-DEXTROSE 2-4 GM/100ML-% IV SOLN
INTRAVENOUS | Status: AC | PRN
  Administered 2023-05-28: 2 g via INTRAVENOUS

## 2023-05-28 MED ORDER — CEFAZOLIN SODIUM-DEXTROSE 2-4 GM/100ML-% IV SOLN
INTRAVENOUS | Status: AC
  Filled 2023-05-28: qty 100

## 2023-05-28 MED ORDER — ALTEPLASE 2 MG IJ SOLR: 2.0000 mg | Freq: Once | INTRAMUSCULAR | Status: DC | PRN

## 2023-05-28 MED ORDER — LIDOCAINE-PRILOCAINE 2.5-2.5 % EX CREA: 1.0000 | TOPICAL_CREAM | CUTANEOUS | Status: DC | PRN

## 2023-05-28 MED ORDER — LIDOCAINE-EPINEPHRINE 2 %-1:100000 IJ SOLN
10.0000 mL | Freq: Once | INTRAMUSCULAR | Status: DC
  Filled 2023-05-28: qty 10

## 2023-05-28 MED ORDER — HEPARIN SODIUM (PORCINE) 1000 UNIT/ML IJ SOLN
INTRAMUSCULAR | Status: AC
  Filled 2023-05-28: qty 10

## 2023-05-28 MED ORDER — PENTAFLUOROPROP-TETRAFLUOROETH EX AERO: 1.0000 | INHALATION_SPRAY | CUTANEOUS | Status: DC | PRN

## 2023-05-28 MED ORDER — MIDAZOLAM HCL 2 MG/2ML IJ SOLN
INTRAMUSCULAR | Status: AC
  Filled 2023-05-28: qty 2

## 2023-05-28 MED ORDER — FENTANYL CITRATE (PF) 100 MCG/2ML IJ SOLN
INTRAMUSCULAR | Status: AC
  Filled 2023-05-28: qty 2

## 2023-05-28 MED ORDER — HEPARIN SODIUM (PORCINE) 1000 UNIT/ML DIALYSIS: 1000.0000 [IU] | INTRAMUSCULAR | Status: DC | PRN

## 2023-05-28 MED ORDER — LIDOCAINE HCL (PF) 1 % IJ SOLN: 5.0000 mL | INTRAMUSCULAR | Status: DC | PRN

## 2023-05-28 MED ORDER — MIDAZOLAM HCL 2 MG/2ML IJ SOLN
INTRAMUSCULAR | Status: AC | PRN
  Administered 2023-05-28 (×3): .5 mg via INTRAVENOUS

## 2023-05-28 MED ORDER — LIDOCAINE HCL (PF) 1 % IJ SOLN
10.0000 mL | Freq: Once | INTRAMUSCULAR | Status: AC
  Administered 2023-05-28: 10 mL

## 2023-05-28 MED ORDER — LIDOCAINE-EPINEPHRINE 1 %-1:100000 IJ SOLN
INTRAMUSCULAR | Status: AC
  Filled 2023-05-28: qty 1

## 2023-05-28 MED ORDER — ONDANSETRON HCL 4 MG/2ML IJ SOLN
INTRAMUSCULAR | Status: AC
  Filled 2023-05-28: qty 2

## 2023-05-28 MED ORDER — MELATONIN 3 MG PO TABS
3.0000 mg | ORAL_TABLET | Freq: Every day | ORAL | Status: DC
  Administered 2023-05-29 – 2023-06-08 (×12): 3 mg via ORAL
  Filled 2023-05-28 (×12): qty 1

## 2023-05-28 NOTE — Progress Notes (Signed)
CSW made attempt to visit pt to discuss PT recommendations for STR. Pt is not in room. RN informed pt is in IR. CSW will revisit to complete SNF workup.

## 2023-05-28 NOTE — Evaluation (Signed)
Occupational Therapy Evaluation Patient Details Name: Samantha Fletcher MRN: [id number] DOB: May 09, 1946 Today's Date: 05/28/2023   History of Present Illness 77 yo female admitted 05/25/23 with AKI, UTI, decreased PT intact with N&V.  She has been anuric now with foley.  Elevated troponins as well with concern for NSTEMI.  PMHx:  anxiety, depression, memory changes, COPD, CKD3a, hypothyroidism, HTN, chronic pain, PAF, hyponatremia, CVA, weakness, fibromyalgia, OSA not on CPAP and vertigo.   Clinical Impression   Pt walks with a RW and can complete basic self care independently (sponge bathes). She is dependent in med management and IADLs by her son. Pt cooperative for session. Demonstrates poor memory and awareness of deficits with generalized weakness and impaired standing balance. She did not require assist to EOB and transferred with RW with min assist to stand. She requires set up to min assist for ADLs. Patient will benefit from continued inpatient follow up therapy, <3 hours/day. Per pt, son has a hx of TBI from age 41 and "does the best he can" to care for her at home.      Recommendations for follow up therapy are one component of a multi-disciplinary discharge planning process, led by the attending physician.  Recommendations may be updated based on patient status, additional functional criteria and insurance authorization.   Assistance Recommended at Discharge Frequent or constant Supervision/Assistance  Patient can return home with the following A little help with walking and/or transfers;A little help with bathing/dressing/bathroom;Assistance with cooking/housework;Direct supervision/assist for medications management;Direct supervision/assist for financial management;Assist for transportation;Help with stairs or ramp for entrance    Functional Status Assessment  Patient has had a recent decline in their functional status and demonstrates the ability to make significant improvements in  function in a reasonable and predictable amount of time.  Equipment Recommendations  None recommended by OT    Recommendations for Other Services       Precautions / Restrictions Precautions Precautions: Fall Precaution Comments: hx of multiple falls Restrictions Weight Bearing Restrictions: No      Mobility Bed Mobility Overal bed mobility: Needs Assistance Bed Mobility: Supine to Sit     Supine to sit: Supervision     General bed mobility comments: increased time, no assist    Transfers Overall transfer level: Needs assistance Equipment used: Rolling walker (2 wheels) Transfers: Sit to/from Stand Sit to Stand: Min assist           General transfer comment: steadying assist      Balance Overall balance assessment: History of Falls                                         ADL either performed or assessed with clinical judgement   ADL Overall ADL's : Needs assistance/impaired Eating/Feeding: Set up;Bed level   Grooming: Brushing hair;Wash/dry hands;Wash/dry face;Sitting;Minimal assistance Grooming Details (indicate cue type and reason): assist to comb hair and put up in elastic Upper Body Bathing: Minimal assistance;Sitting Upper Body Bathing Details (indicate cue type and reason): assist for back Lower Body Bathing: Minimal assistance;Sit to/from stand   Upper Body Dressing : Set up;Sitting   Lower Body Dressing: Minimal assistance;Sit to/from stand   Toilet Transfer: Minimal assistance;Stand-pivot;Rolling walker (2 wheels) Toilet Transfer Details (indicate cue type and reason): simulated to bed to chair                 Vision Ability to See  in Adequate Light: 0 Adequate Patient Visual Report: No change from baseline       Perception     Praxis      Pertinent Vitals/Pain Pain Assessment Pain Assessment: Faces Faces Pain Scale: Hurts little more Pain Location: back Pain Descriptors / Indicators: Discomfort, Grimacing,  Guarding Pain Intervention(s): Repositioned, Monitored during session     Hand Dominance Right   Extremity/Trunk Assessment Upper Extremity Assessment Upper Extremity Assessment: Overall WFL for tasks assessed;RUE deficits/detail RUE Deficits / Details: hx of fx, but did not observe any functional deficits, arthritic changes in hands   Lower Extremity Assessment Lower Extremity Assessment: Defer to PT evaluation   Cervical / Trunk Assessment Cervical / Trunk Assessment: Other exceptions;Kyphotic Cervical / Trunk Exceptions: back pain   Communication Communication Communication: No difficulties   Cognition Arousal/Alertness: Awake/alert Behavior During Therapy: WFL for tasks assessed/performed Overall Cognitive Status: No family/caregiver present to determine baseline cognitive functioning                                 General Comments: pt with hx of dementia, poor memory and awareness of deficits     General Comments       Exercises     Shoulder Instructions      Home Living Family/patient expects to be discharged to:: Private residence Living Arrangements: Children Available Help at Discharge: Family;Available 24 hours/day Type of Home: House Home Access: Ramped entrance;Stairs to enter Entrance Stairs-Number of Steps: 1   Home Layout: One level     Bathroom Shower/Tub: Producer, television/film/video: Standard     Home Equipment: Agricultural consultant (2 wheels);Cane - quad;Shower seat   Additional Comments: hx of falls with fxs, son has an old TBI      Prior Functioning/Environment Prior Level of Function : Needs assist             Mobility Comments: waks with RW, completes basic self care independently, assisted for med management and IADLs ADLs Comments: sponge bathes, cognition impedes showering per sister        OT Problem List: Decreased strength;Decreased activity tolerance;Impaired balance (sitting and/or standing);Decreased  cognition;Pain      OT Treatment/Interventions: Self-care/ADL training;DME and/or AE instruction;Therapeutic activities;Cognitive remediation/compensation;Balance training;Patient/family education    OT Goals(Current goals can be found in the care plan section) Acute Rehab OT Goals OT Goal Formulation: With patient Time For Goal Achievement: 06/11/23 Potential to Achieve Goals: Good ADL Goals Pt Will Perform Grooming: with supervision;standing Pt Will Perform Lower Body Bathing: with supervision;sit to/from stand Pt Will Perform Lower Body Dressing: with supervision;sit to/from stand Pt Will Transfer to Toilet: with supervision;ambulating Pt Will Perform Toileting - Clothing Manipulation and hygiene: with supervision;sit to/from stand  OT Frequency: Min 1X/week    Co-evaluation              AM-PAC OT "6 Clicks" Daily Activity     Outcome Measure Help from another person eating meals?: None Help from another person taking care of personal grooming?: A Little Help from another person toileting, which includes using toliet, bedpan, or urinal?: A Little Help from another person bathing (including washing, rinsing, drying)?: A Little Help from another person to put on and taking off regular upper body clothing?: A Little Help from another person to put on and taking off regular lower body clothing?: A Little 6 Click Score: 19   End of Session Equipment Utilized During Treatment:  Gait belt;Rolling walker (2 wheels);Oxygen Nurse Communication: Mobility status  Activity Tolerance: Patient tolerated treatment well Patient left: in bed;with call bell/phone within reach;with bed alarm set  OT Visit Diagnosis: Unsteadiness on feet (R26.81);Other abnormalities of gait and mobility (R26.89);Muscle weakness (generalized) (M62.81);Pain;Other symptoms and signs involving cognitive function                Time: 0920-0950 OT Time Calculation (min): 30 min Charges:  OT General Charges $OT  Visit: 1 Visit OT Evaluation $OT Eval Moderate Complexity: 1 Mod OT Treatments $Self Care/Home Management : 8-22 mins  Berna Spare, OTR/L Acute Rehabilitation Services Office: [phone]   Evern Bio 05/28/2023, 11:04 AM

## 2023-05-28 NOTE — Progress Notes (Signed)
Patient ID: Samantha Fletcher, female   DOB: 1946-01-11, 77 y.o.   MRN: [id number] S: Continues to complain of abdominal cramps. O:BP 133/65 (BP Location: Right Arm)   Pulse 62   Temp 98.3 F (36.8 C) (Oral)   Resp 18   Ht 5\' 2"  (1.575 m)   Wt 55.3 kg   LMP  (LMP Unknown)   SpO2 96%   BMI 22.30 kg/m   Intake/Output Summary (Last 24 hours) at 05/28/2023 0838 Last data filed at 05/28/2023 0819 Gross per 24 hour  Intake 1031.93 ml  Output 18.5 ml  Net 1013.43 ml   Intake/Output: I/O last 3 completed shifts: In: 1871.9 [P.O.:840; I.V.:1031.9] Out: 18.5 [Urine:18.5]  Intake/Output this shift:  No intake/output data recorded. Weight change: -2 kg Gen: NAD CVS: RRR Resp:CTA Abd: +BS, soft, mildly tender to deep palpation Ext: no edema  Recent Labs  Lab 05/25/23 2319 05/26/23 0003 05/26/23 0121 05/26/23 0819 05/27/23 0030 05/27/23 0705 05/27/23 1603 05/28/23 0026  NA 131* 129* 130* 130* 128* 126* 127* 126*  K 4.5 4.5 4.4 4.4 5.7* 5.5* 5.2* 4.9  CL 94* 103  --  96* 99 98 97* 91*  CO2 17*  --   --  15* 17* 17* 17* 22  GLUCOSE 98 89  --  103* 89 90 89 91  BUN 96* 106*  --  92* 86* 86* 84* 81*  CREATININE 7.93* 8.70*  --  7.68* 7.75* 7.62* 7.79* 8.00*  ALBUMIN 3.0*  --   --   --  2.1* 2.2* 2.2* 2.1*  CALCIUM 8.4*  --   --  7.7* 7.5* 7.4* 7.3* 7.3*  PHOS  --   --   --   --  4.8* 4.8* 5.1* 5.4*  AST 30  --   --   --   --   --   --   --   ALT 19  --   --   --   --   --   --   --    Liver Function Tests: Recent Labs  Lab 05/25/23 2319 05/27/23 0030 05/27/23 0705 05/27/23 1603 05/28/23 0026  AST 30  --   --   --   --   ALT 19  --   --   --   --   ALKPHOS 67  --   --   --   --   BILITOT 0.5  --   --   --   --   PROT 6.3*  --   --   --   --   ALBUMIN 3.0*   < > 2.2* 2.2* 2.1*   < > = values in this interval not displayed.   Recent Labs  Lab 05/25/23 2319  LIPASE 44   No results for input(s): "AMMONIA" in the last 168 hours. CBC: Recent Labs  Lab 05/25/23 2319  05/26/23 0003 05/26/23 0819 05/27/23 0030 05/28/23 0026  WBC 12.1*  --  10.1 10.0 11.0*  NEUTROABS 10.3*  --   --   --   --   HGB 9.6*   < > 8.6* 8.1* 7.5*  HCT 29.7*   < > 26.1* 24.9* 22.7*  MCV 84.6  --  82.9 83.8 81.9  PLT 153  --  PLATELET CLUMPS NOTED ON SMEAR, UNABLE TO ESTIMATE 142* 166   < > = values in this interval not displayed.   Cardiac Enzymes: No results for input(s): "CKTOTAL", "CKMB", "CKMBINDEX", "TROPONINI" in the last 168 hours. CBG: No  results for input(s): "GLUCAP" in the last 168 hours.  Iron Studies:  Recent Labs    05/26/23 1259  IRON 43  TIBC 207*   Studies/Results: No results found.  acetaminophen  1,000 mg Oral Q8H   Chlorhexidine Gluconate Cloth  6 each Topical Q0600   heparin injection (subcutaneous)  5,000 Units Subcutaneous Q8H   hydrocortisone cream   Topical BID   hydrOXYzine  25 mg Oral BID   lidocaine  1 patch Transdermal Q24H   lidocaine  1 Application Urethral Daily   melatonin  3 mg Oral QHS   sodium zirconium cyclosilicate  10 g Oral BID    BMET    Component Value Date/Time   NA 126 (L) 05/28/2023 0026   K 4.9 05/28/2023 0026   CL 91 (L) 05/28/2023 0026   CO2 22 05/28/2023 0026   GLUCOSE 91 05/28/2023 0026   BUN 81 (H) 05/28/2023 0026   CREATININE 8.00 (H) 05/28/2023 0026   CALCIUM 7.3 (L) 05/28/2023 0026   GFRNONAA 5 (L) 05/28/2023 0026   GFRAA >60 10/10/2016 0556   CBC    Component Value Date/Time   WBC 11.0 (H) 05/28/2023 0026   RBC 2.77 (L) 05/28/2023 0026   HGB 7.5 (L) 05/28/2023 0026   HCT 22.7 (L) 05/28/2023 0026   PLT 166 05/28/2023 0026   MCV 81.9 05/28/2023 0026   MCH 27.1 05/28/2023 0026   MCHC 33.0 05/28/2023 0026   RDW 15.3 05/28/2023 0026   LYMPHSABS 1.0 05/25/2023 2319   MONOABS 0.5 05/25/2023 2319   EOSABS 0.1 05/25/2023 2319   BASOSABS 0.0 05/25/2023 2319     Assessment/Plan:  AKI, oliguric - by history this is most consistent with ischemic ATN in setting of N/V for the past week with  concomitant ARB therapy. Unfortunately no urine to evaluate.  No evidence of cortical necrosis on renal US, no NSAID use, time course too short for AIN, no obstruction.  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  Negative ANA, dsDNA, ASO, normal complements.  ANCA pending.  Since no UOP for the past 48 hours will consult IR for temp HD catheter.  She is amenable to HD.  Would continue with IVF's but decrease rate to 50 mL/hr.  Continue to hold losartan.  Avoid nephrotoxic medications including NSAIDs and iodinated intravenous contrast exposure unless the latter is absolutely indicated.  Preferred narcotic agents for pain control are hydromorphone, fentanyl, and methadone. Morphine should not be used. Avoid Baclofen and avoid oral sodium phosphate and magnesium citrate based laxatives / bowel preps. Continue strict Input and Output monitoring. Will monitor the patient closely with you and intervene or adjust therapy as indicated by changes in clinical status/labs  AGMA - due to #1.  Will switch from lactated ringers to isotonic bicarb due to hyperkalemia.  Hyperkalemia - given lokelma 5 mg x 1, will stop LR and change to isotonic bicarb.  Also increase dose of Lokelma to 10 grams.  Hyponatremia - again likely hypovolemic hyponatremia by history.  Unable to obtain urine sample at this time and will continue to follow.   Will check cortisol level in am to r/o adrenal insufficiency. Elevated troponin - with vague history of chest pain.  Per primary svc. HTN- low bp, losartan on hold due to AKI. Low back pain - chronic Abdominal pain - CT of abd/pelvis without source of symptoms.  COPD - per primary svc Normocytic anemia - will check iron stores.  Transfuse if Hgb drops to 7.   Irena Cords, MD  [facility]

## 2023-05-28 NOTE — Procedures (Signed)
Vascular and Interventional Radiology Procedure Note  Patient: Samantha Fletcher DOB: [birth year] Medical Record Number: [id number] Note Date/Time: 05/28/23 12:19 PM   Performing Physician: Roanna Banning, MD Assistant(s): None  Diagnosis: ESRD requiring Hemodialysis  Procedure: TUNNELED HEMODIALYSIS CATHETER PLACEMENT  Anesthesia: Conscious Sedation Complications: None Estimated Blood Loss: Minimal Specimens:  None  Findings:  Successful placement of right-sided, 19 cm (tip-to-cuff), tunneled hemodialysis catheter with the tip of the catheter in the proximal right atrium.  Plan: Catheter ready for use.  See detailed procedure note with images in PACS. The patient tolerated the procedure well without incident or complication and was returned to Recovery in stable condition.    Roanna Banning, MD [facility] Radiology   Pager. [phone] Clinic. [phone]
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
## 2023-05-28 NOTE — Progress Notes (Addendum)
Subjective: Samantha Fletcher is a 77 y.o. with a pertinent PMH of HTN, COPD, paroxysmal AF, chronic pain, and recurring UTIs who presented on 6/30 with back, abdominal and chest pain and admitted to our service for acute renal failure.   No overnight events. She reports that her coughing and SOB worsened last night, and she was placed on 2L O2 via [place]. She reports she is still "itching all over". Team reviewed nephrology's plan to begin dialysis today. Patient was able to state the plan and acknowledged understanding. Requested that we update her sister, Darel Hong.   Objective:  Vital signs in last 24 hours: Vitals:   05/26/23 0245 05/26/23 0341 05/26/23 0725 05/26/23 1119  BP: 139/61 (!) 141/61 (!) 139/57 117/67  Pulse: 82 62 61 61  Resp: 19 20 16 20   Temp:  98.2 F (36.8 C) 98.6 F (37 C) 98.5 F (36.9 C)  TempSrc:  Oral Oral Oral  SpO2: 98% 97% 98% 94%  Weight:      Height:          Latest Ref Rng & Units 05/28/2023   12:26 AM 05/27/2023   12:30 AM 05/26/2023    8:19 AM  CBC  WBC 4.0 - 10.5 K/uL 11.0  10.0  10.1   Hemoglobin 12.0 - 15.0 g/dL 7.5  8.1  8.6   Hematocrit 36.0 - 46.0 % 22.7  24.9  26.1   Platelets 150 - 400 K/uL 166  142  PLATELET CLUMPS NOTED ON SMEAR, UNABLE TO ESTIMATE    Renal Functional Panel           Component Ref Range & Units 00:26 (05/28/23) 1 d ago (05/27/23) 1 d ago (05/27/23) 1 d ago (05/27/23) 2 d ago (05/26/23) 2 d ago (05/26/23) 2 d ago (05/26/23)  Sodium 135 - 145 mmol/L 126 Low  127 Low  126 Low  128 Low  130 Low  130 Low  129 Low   Potassium 3.5 - 5.1 mmol/L 4.9 5.2 High  5.5 High  5.7 High  4.4 4.4 4.5  Chloride 98 - 111 mmol/L 91 Low  97 Low  98 99 96 Low   103  CO2 22 - 32 mmol/L 22 17 Low  17 Low  17 Low  15 Low     Glucose, Bld 70 - 99 mg/dL 91 89 CM 90 CM 89 CM 098 High  CM  89 CM  Comment: Glucose reference range applies only to samples taken after fasting for at least 8 hours.  BUN 8 - 23 mg/dL 81 High  84 High  86 High  86 High  92 High   106  High   Creatinine, Ser 0.44 - 1.00 mg/dL 1.19 High  1.47 High  8.29 High  7.75 High  7.68 High   8.70 High   Calcium 8.9 - 10.3 mg/dL 7.3 Low  7.3 Low  7.4 Low  7.5 Low  7.7 Low     Phosphorus 2.5 - 4.6 mg/dL 5.4 High  5.1 High  4.8 High  4.8 High      Albumin 3.5 - 5.0 g/dL 2.1 Low  2.2 Low  2.2 Low  2.1 Low      GFR, Estimated >60 mL/min 5 Low  5 Low  CM 5 Low  CM 5 Low  CM 5 Low  CM    Comment: (NOTE) Calculated using the CKD-EPI Creatinine Equation (2021)  Anion gap 5 - 15 13 13  CM 11 CM 12 CM 19 High  CM    Comment: Performed at [facility], [address], [place], [place] [address]  Resulting Agency [facility]      In/Outs: On maintenance NaBicarb, output of 23 ccs of urine in foley bag this morning (in addition to 13.5 ccs from 7/2).   Imaging: no new imaging reviewed since admission, refer to H&P   Physical Exam:  General: Resting in bed, cooperative and able to ask and answer questions appropriately. Did not appear to be in acute distress  CV: RRR, no murmurs, rubs, or gallops Pulmonary/chest: Crackles bilaterally across lung bases, normal respiratory effort with [place] in place  Abdominal: Soft, tender to palpation Neurological: Alert and oriented to self, place, and situation Skin: Warm and dry  Assessment/Plan: Samantha Fletcher is a 77 y.o. with a history of COPD, HTN, chronic pain, and recurrent UTIs who presented with chest and back pain. She was admitted to our service for acute renal failure.   #Acute Renal Failure #Hyperkalemia - resolved #AGMA - resolved  ARF most consistent with ischemic ATN in the setting of N/V with concomitant ARB therapy (losartan) and possible NG/vasculitis. Nephrology initiated a serological workup and recommended continuing to hold home losartan as well as continuing IVFs.   Patient with crackles across lung bases on exam, likely due to maintenance fluids and  most likely cause of patient's cough and SOB (especially while laying flat). Patient alert and oriented, continues to Dr. Arrie Aran with nephrology will continue to manage the fluid rate (currently sodium bicarbonate 150). Cr today 8.00, potassium 4.9.  Anion gap 13. Approximately ~23 ccs of urine over 24 hours with maintenance fluids and Lasix 80. Given worsening itch, abdominal pain, and minimal urine output after fluids and Lasix 80, nephrology will proceed with hemodialysis today.  Plan:  - Trend Creatinine  - Monitor urine output  - Serial volume status exams - IV Sodium Bicarb 150 ml/hr to be decreased/managed by nephrology  - F/U UA - F/U serologies - Lidocaine 2% jelly urethral scheduled daily- helped with localized pain - Lidocaine patch on back  - Dilaudid PRN q6 - Scheduled Tylenol q8h  - Cortisone cream for pruritus  - Scheduled hydroxyzine/atarax 25 mg BID for pruritus  - HD catheter placement by IR followed by HD - NPO starting 7/3 - Melatonin 3 mg started by overnight team 7/3   #Tropinemia- resolved Pt complaining of chest pain, found to have troponin level of 464 on admission, down to 363. Could be in the setting of acute renal failure given decreased clearance of troponins. EKG reassuring with no ST elevations, ST depressions, or T wave inversions. Currently, pt denies any chest pain, shortness of breath, or vomiting. Continues to have some nausea but it is associated with eating and Zofran was found to be helpful.TIMI score of 4. Attempted echocardiogram but patient refused as she has been worked up by cardiology in the past and CP has improved. Improved to 363 on 7/2.   #HTN Holding home telmisartan/hctz in the setting of ARF   #COPD Pt has not had formal diagnosis with PFTs, only uses albuterol inhaler PRN.    #Hypothyroidism  On Levothyroxine    #Normocytic Anemia  Pt with hemoglobin of 9.6 on admission, 8.1 today. Previous admission showed work up with  normal B12, folic acid, cortisol, and TSH to evaluate for generalized weakness and fatigue. Does have  a history of iron deficiency, with last iron panel done one year ago. Iron WNL 43, TIBC 207.  Decrease could be dilutional given she is making very little urine.  We will monitor for now and transfuse as indicated.    Plan:  - Trend Hb   Diet: NPO pending HD VTE: Subcutaneous Heparin  IVF: LR maintenance fluids Code: DNR    Prior to Admission Living Arrangement: Home, living with son Anticipated Discharge Location: Post acute inpatient rehab Barriers to Discharge: Medical management Dispo: Anticipated discharge greater than two nights.    Philomena Doheny, MD, PGY-1 05/26/2023, 12:41 PM After 5pm on weekdays and 1pm on weekends: On Call pager [phone]

## 2023-05-28 NOTE — Progress Notes (Signed)
PT Cancellation Note  Patient Details Name: Samantha Fletcher MRN: [id number] DOB: Oct 20, 1946   Cancelled Treatment:    Reason Eval/Treat Not Completed: (P) Patient at procedure or test/unavailable (Pt off unit for tunneled dialysis cath placement, RN reports plan after IR to dialysis, will f/u per POC.)   Phong Isenberg Artis Delay 05/28/2023, 11:39 AM  [name] , PTA Acute Rehabilitation Services Office [phone]

## 2023-05-29 DIAGNOSIS — J449 Chronic obstructive pulmonary disease, unspecified: Secondary | ICD-10-CM | POA: Diagnosis not present

## 2023-05-29 DIAGNOSIS — N170 Acute kidney failure with tubular necrosis: Secondary | ICD-10-CM | POA: Diagnosis not present

## 2023-05-29 DIAGNOSIS — I10 Essential (primary) hypertension: Secondary | ICD-10-CM | POA: Diagnosis not present

## 2023-05-29 DIAGNOSIS — E039 Hypothyroidism, unspecified: Secondary | ICD-10-CM | POA: Diagnosis not present

## 2023-05-29 LAB — CBC
HCT: 21.7 % — ABNORMAL LOW (ref 36.0–46.0)
Hemoglobin: 7.2 g/dL — ABNORMAL LOW (ref 12.0–15.0)
MCH: 27.3 pg (ref 26.0–34.0)
MCHC: 33.2 g/dL (ref 30.0–36.0)
MCV: 82.2 fL (ref 80.0–100.0)
Platelets: 208 10*3/uL (ref 150–400)
RBC: 2.64 MIL/uL — ABNORMAL LOW (ref 3.87–5.11)
RDW: 15.8 % — ABNORMAL HIGH (ref 11.5–15.5)
WBC: 10.1 10*3/uL (ref 4.0–10.5)
nRBC: 0 % (ref 0.0–0.2)

## 2023-05-29 LAB — HEMOGLOBIN AND HEMATOCRIT, BLOOD
HCT: 21.8 % — ABNORMAL LOW (ref 36.0–46.0)
Hemoglobin: 7.3 g/dL — ABNORMAL LOW (ref 12.0–15.0)

## 2023-05-29 LAB — RENAL FUNCTION PANEL
Albumin: 2.2 g/dL — ABNORMAL LOW (ref 3.5–5.0)
Anion gap: 13 (ref 5–15)
BUN: 53 mg/dL — ABNORMAL HIGH (ref 8–23)
CO2: 24 mmol/L (ref 22–32)
Calcium: 7.2 mg/dL — ABNORMAL LOW (ref 8.9–10.3)
Chloride: 92 mmol/L — ABNORMAL LOW (ref 98–111)
Creatinine, Ser: 5.65 mg/dL — ABNORMAL HIGH (ref 0.44–1.00)
GFR, Estimated: 7 mL/min — ABNORMAL LOW (ref >60)
Glucose, Bld: 86 mg/dL (ref 70–99)
Phosphorus: 4.6 mg/dL (ref 2.5–4.6)
Potassium: 4.3 mmol/L (ref 3.5–5.1)
Sodium: 129 mmol/L — ABNORMAL LOW (ref 135–145)

## 2023-05-29 LAB — URINALYSIS, ROUTINE W REFLEX MICROSCOPIC
Bilirubin Urine: NEGATIVE
Glucose, UA: NEGATIVE mg/dL
Ketones, ur: NEGATIVE mg/dL
Nitrite: NEGATIVE
Protein, ur: 100 mg/dL — AB
Specific Gravity, Urine: 1.008 (ref 1.005–1.030)
WBC, UA: >50 WBC/hpf (ref 0–5)
pH: 7 (ref 5.0–8.0)

## 2023-05-29 LAB — HEPATITIS B SURFACE ANTIBODY, QUANTITATIVE: Hep B S AB Quant (Post): <3.5 m[IU]/mL — ABNORMAL LOW

## 2023-05-29 LAB — OSMOLALITY, URINE: Osmolality, Ur: 256 mOsm/kg — ABNORMAL LOW (ref 300–900)

## 2023-05-29 LAB — CREATININE, URINE, RANDOM: Creatinine, Urine: 26 mg/dL

## 2023-05-29 LAB — SODIUM, URINE, RANDOM: Sodium, Ur: 74 mmol/L

## 2023-05-29 MED ORDER — PROMETHAZINE HCL 25 MG PO TABS
25.0000 mg | ORAL_TABLET | Freq: Once | ORAL | Status: AC
  Administered 2023-05-29: 25 mg via ORAL
  Filled 2023-05-29: qty 1

## 2023-05-29 MED ORDER — CALCIUM CARBONATE ANTACID 1250 MG/5ML PO SUSP
500.0000 mg | Freq: Four times a day (QID) | ORAL | Status: DC | PRN
  Administered 2023-05-29: 500 mg via ORAL
  Filled 2023-05-29 (×2): qty 5

## 2023-05-29 MED ORDER — ONDANSETRON HCL 4 MG PO TABS
4.0000 mg | ORAL_TABLET | Freq: Once | ORAL | Status: AC
  Administered 2023-05-29: 4 mg via ORAL
  Filled 2023-05-29: qty 1

## 2023-05-29 MED ORDER — SENNOSIDES-DOCUSATE SODIUM 8.6-50 MG PO TABS: 1.0000 | ORAL_TABLET | Freq: Two times a day (BID) | ORAL | Status: DC

## 2023-05-29 MED ORDER — ALBUTEROL SULFATE (2.5 MG/3ML) 0.083% IN NEBU
2.5000 mg | INHALATION_SOLUTION | Freq: Two times a day (BID) | RESPIRATORY_TRACT | Status: DC
  Administered 2023-05-29 – 2023-05-31 (×4): 2.5 mg via RESPIRATORY_TRACT
  Filled 2023-05-29 (×6): qty 3

## 2023-05-29 MED ORDER — SENNOSIDES-DOCUSATE SODIUM 8.6-50 MG PO TABS
2.0000 | ORAL_TABLET | Freq: Two times a day (BID) | ORAL | Status: DC
  Administered 2023-05-29 – 2023-06-09 (×21): 2 via ORAL
  Filled 2023-05-29 (×22): qty 2

## 2023-05-29 MED ORDER — IOHEXOL 9 MG/ML PO SOLN
500.0000 mL | ORAL | Status: AC
  Administered 2023-05-29: 500 mL via ORAL

## 2023-05-29 MED ORDER — SIMETHICONE 80 MG PO CHEW
80.0000 mg | CHEWABLE_TABLET | Freq: Once | ORAL | Status: AC
  Administered 2023-05-29: 80 mg via ORAL
  Filled 2023-05-29: qty 1

## 2023-05-29 MED ORDER — SODIUM CHLORIDE 0.9 % IV SOLN
200.0000 mg | Freq: Once | INTRAVENOUS | Status: AC
  Administered 2023-05-29: 200 mg via INTRAVENOUS
  Filled 2023-05-29: qty 10

## 2023-05-29 MED ORDER — PANTOPRAZOLE SODIUM 40 MG PO TBEC
40.0000 mg | DELAYED_RELEASE_TABLET | Freq: Every day | ORAL | Status: DC
  Administered 2023-05-29 – 2023-06-09 (×12): 40 mg via ORAL
  Filled 2023-05-29 (×12): qty 1

## 2023-05-29 MED ORDER — ALUM & MAG HYDROXIDE-SIMETH 200-200-20 MG/5ML PO SUSP: 30.0000 mL | ORAL | Status: DC | PRN

## 2023-05-29 MED ORDER — POLYETHYLENE GLYCOL 3350 17 G PO PACK
17.0000 g | PACK | Freq: Every day | ORAL | Status: DC
  Administered 2023-05-29 – 2023-06-09 (×7): 17 g via ORAL
  Filled 2023-05-29 (×11): qty 1

## 2023-05-29 NOTE — Progress Notes (Signed)
Mobility Specialist Progress Note:    05/29/23 1338  Mobility  Activity Ambulated with assistance in room  Level of Assistance Minimal assist, patient does 75% or more  Assistive Device Stedy  Activity Response Tolerated fair  Mobility Referral Yes  $Mobility charge 1 Mobility  Mobility Specialist Start Time (ACUTE ONLY) 1325  Mobility Specialist Stop Time (ACUTE ONLY) 1338  Mobility Specialist Time Calculation (min) (ACUTE ONLY) 13 min   Pt received in the BR, requesting to get back in bed. Pt stated she was unable to stand d/t numbness in her legs, stedy was used. Pt was able to stand w/ minA and NT was able to perform pericare. Pt assisted back to bed, moving independently in bed. All needs met. Left w/ NT in room.   Thompson Grayer Mobility Specialist  Please contact vis Secure Chat or  Rehab Office [phone]

## 2023-05-29 NOTE — Care Management Important Message (Signed)
Important Message  Patient Details  Name: Samantha Fletcher MRN: [id number] Date of Birth: 01/28/1946   Medicare Important Message Given:  Yes     Renie Ora 05/29/2023, 8:40 AM

## 2023-05-29 NOTE — Progress Notes (Signed)
   05/28/23 2255  Vitals  Temp 97.7 F (36.5 C)  Temp Source Axillary  BP (!) 119/50  MAP (mmHg) 71  BP Location Left Arm  BP Method Automatic  Patient Position (if appropriate) Lying  Pulse Rate 62  Pulse Rate Source Monitor  Oxygen Therapy  O2 Device Nasal Cannula;Room Air  O2 Flow Rate (L/min) 2 L/min  Pre Treatment  Vascular access used during treatment Catheter  Hemodialysis Consent Verified Yes  Length of  DialysisTreatment -hour(s) 2.5 Hour(s)  Dialyzer Revaclear 300  During Treatment Monitoring  Intra-Hemodialysis Comments Tx completed  Post Treatment  Dialyzer Clearance Lightly streaked  Duration of HD Treatment -hour(s) 2.5 hour(s)  Hemodialysis Intake (mL) 0 mL  Liters Processed 30  Fluid Removed (mL) 1000 mL  Tolerated HD Treatment Yes  Post-Hemodialysis Comments pt stable  Hemodialysis Catheter Right Internal jugular Double lumen Permanent (Tunneled)  Placement Date/Time: 05/28/23 1146   Serial / Lot #: [id number]  Expiration Date: 08/31/27  Maximum sterile barrier precautions: Hand hygiene;Cap;Mask;Sterile gown;Sterile gloves;Large sterile sheet  Site Prep: Chlorhexidine (preferred)  Local Anesthe...  Site Condition No complications  Blue Lumen Status Heparin locked  Red Lumen Status Heparin locked  Catheter fill solution Heparin 1000 units/ml  Catheter fill volume (Arterial) 1.6 cc  Catheter fill volume (Venous) 1.6  Dressing Type Transparent  Dressing Status Clean, Dry, Intact  Interventions New dressing  Dressing Change Due 06/03/23  Post treatment catheter status Capped and Clamped   Pt stable post tx. 1st dialysis net UF 

## 2023-05-29 NOTE — Progress Notes (Signed)
PT Cancellation Note  Patient Details Name: Samantha Fletcher MRN: [id number] DOB: [birth year]   Cancelled Treatment:    Reason Eval/Treat Not Completed: (P) Patient declined, no reason specified (Pt agitated and stating "go away". Pt requesting that her nurse be sent in and stating that she is in pain, however not specifying where. Will continue to follow up as able.)   Johny Shock 05/29/2023, 1:51 PM

## 2023-05-29 NOTE — Progress Notes (Addendum)
Patient ID: Samantha Fletcher, female   DOB: [birth year], 77 y.o.   MRN: [id number] S:Not feeling well this am due to nausea and abdominal pain.  Had HD early this morning. O:BP 131/63 (BP Location: Right Arm)   Pulse 64   Temp 98.5 F (36.9 C) (Oral)   Resp 20   Ht 5\' 2"  (1.575 m)   Wt 58.4 kg   LMP  (LMP Unknown)   SpO2 98%   BMI 23.55 kg/m   Intake/Output Summary (Last 24 hours) at 05/29/2023 0849 Last data filed at 05/29/2023 0000 Gross per 24 hour  Intake 720 ml  Output 1150 ml  Net -430 ml   Intake/Output: I/O last 3 completed shifts: In: 1140 [P.O.:1140] Out: 1150 [Urine:150; Other:1000]  Intake/Output this shift:  No intake/output data recorded. Weight change: 3.1 kg Gen: NAD CVS: RRR Resp:CTA Abd: +BS, soft, + diffuse tenderness with guarding, no rebound Ext: no edema  Recent Labs  Lab 05/25/23 2319 05/26/23 0003 05/26/23 0819 05/27/23 0030 05/27/23 0705 05/27/23 1603 05/28/23 0026 05/28/23 1841 05/29/23 0026  NA 131*   < > 130* 128* 126* 127* 126* 126* 129*  K 4.5   < > 4.4 5.7* 5.5* 5.2* 4.9 5.1 4.3  CL 94*   < > 96* 99 98 97* 91* 85* 92*  CO2 17*  --  15* 17* 17* 17* 22 25 24   GLUCOSE 98   < > 103* 89 90 89 91 99 86  BUN 96*   < > 92* 86* 86* 84* 81* 85* 53*  CREATININE 7.93*   < > 7.68* 7.75* 7.62* 7.79* 8.00* 8.28* 5.65*  ALBUMIN 3.0*  --   --  2.1* 2.2* 2.2* 2.1* 2.1* 2.2*  CALCIUM 8.4*  --  7.7* 7.5* 7.4* 7.3* 7.3* 7.1* 7.2*  PHOS  --   --   --  4.8* 4.8* 5.1* 5.4* 6.5* 4.6  AST 30  --   --   --   --   --   --   --   --   ALT 19  --   --   --   --   --   --   --   --    < > = values in this interval not displayed.   Liver Function Tests: Recent Labs  Lab 05/25/23 2319 05/27/23 0030 05/28/23 0026 05/28/23 1841 05/29/23 0026  AST 30  --   --   --   --   ALT 19  --   --   --   --   ALKPHOS 67  --   --   --   --   BILITOT 0.5  --   --   --   --   PROT 6.3*  --   --   --   --   ALBUMIN 3.0*   < > 2.1* 2.1* 2.2*   < > = values in this interval  not displayed.   Recent Labs  Lab 05/25/23 2319  LIPASE 44   No results for input(s): "AMMONIA" in the last 168 hours. CBC: Recent Labs  Lab 05/25/23 2319 05/26/23 0003 05/27/23 0030 05/28/23 0026 05/28/23 1235 05/28/23 1841 05/29/23 0026 05/29/23 0609  WBC 12.1*   < > 10.0 11.0* 11.4* 10.8* 10.1  --   NEUTROABS 10.3*  --   --   --   --   --   --   --   HGB 9.6*   < > 8.1* 7.5* 8.4* 7.1*  7.2* 7.3*  HCT 29.7*   < > 24.9* 22.7* 25.0* 21.3* 21.7* 21.8*  MCV 84.6   < > 83.8 81.9 84.5 81.3 82.2  --   PLT 153   < > 142* 166 190 201 208  --    < > = values in this interval not displayed.   Cardiac Enzymes: No results for input(s): "CKTOTAL", "CKMB", "CKMBINDEX", "TROPONINI" in the last 168 hours. CBG: No results for input(s): "GLUCAP" in the last 168 hours.  Iron Studies:  Recent Labs    05/26/23 1259  IRON 43  TIBC 207*   Studies/Results: IR Fluoro Guide CV Line Right  Result Date: 05/28/2023 INDICATION: ESRD requiring HD. EXAM: TUNNELED CENTRAL VENOUS HEMODIALYSIS CATHETER PLACEMENT WITH ULTRASOUND AND FLUOROSCOPIC GUIDANCE MEDICATIONS: None. ANESTHESIA/SEDATION: Local anesthetic and single agent sedation was employed during this procedure. A total of Versed 1.5 mg was administered intravenously. The patient's level of consciousness and vital signs were monitored continuously by radiology nursing throughout the procedure under my direct supervision. FLUOROSCOPY TIME:  Fluoroscopic dose; 1 mGy COMPLICATIONS: None immediate. PROCEDURE: Informed written consent was obtained from the the patient and/or patient's representative after a discussion of the risks, benefits, and alternatives to treatment. Questions regarding the procedure were encouraged and answered. The RIGHT neck and chest were prepped with chlorhexidine in a sterile fashion, and a sterile drape was applied covering the operative field. Maximum barrier sterile technique with sterile gowns and gloves were used for the  procedure. A timeout was performed prior to the initiation of the procedure. After creating a small venotomy incision, a micropuncture kit was utilized to access the internal jugular vein. Real-time ultrasound guidance was utilized for vascular access including the acquisition of a permanent ultrasound image documenting patency of the accessed vessel. The microwire was utilized to measure appropriate catheter length. A stiff Glidewire was advanced to the level of the IVC and the micropuncture sheath was exchanged for a peel-away sheath. A palindrome tunneled hemodialysis catheter measuring 19 cm from tip to cuff was tunneled in a retrograde fashion from the anterior chest wall to the venotomy incision. The catheter was then placed through the peel-away sheath with tips ultimately positioned within the superior aspect of the right atrium. Final catheter positioning was confirmed and documented with a spot radiographic image. The catheter aspirates and flushes normally. The catheter was flushed with appropriate volume heparin dwells. The catheter exit site was secured with a 0-Prolene retention suture. The venotomy incision was closed with an interrupted 4-0 Vicryl, Dermabond and Steri-strips. Dressings were applied. The patient tolerated the procedure well without immediate post procedural complication. IMPRESSION: Successful placement of 19 cm tip to cuff tunneled hemodialysis catheter via the RIGHT internal jugular vein The tip of the catheter is positioned within the proximal RIGHT atrium. The catheter is ready for immediate use. Roanna Banning, MD [facility] Radiology Electronically Signed   By: Roanna Banning M.D.   On: 05/28/2023 17:12   IR US Guide Vasc Access Right  Result Date: 05/28/2023 INDICATION: ESRD requiring HD. EXAM: TUNNELED CENTRAL VENOUS HEMODIALYSIS CATHETER PLACEMENT WITH ULTRASOUND AND FLUOROSCOPIC GUIDANCE MEDICATIONS: None. ANESTHESIA/SEDATION:  Local anesthetic and single agent sedation was employed during this procedure. A total of Versed 1.5 mg was administered intravenously. The patient's level of consciousness and vital signs were monitored continuously by radiology nursing throughout the procedure under my direct supervision. FLUOROSCOPY TIME:  Fluoroscopic dose; 1 mGy COMPLICATIONS: None immediate. PROCEDURE: Informed written consent was obtained from the the  patient and/or patient's representative after a discussion of the risks, benefits, and alternatives to treatment. Questions regarding the procedure were encouraged and answered. The RIGHT neck and chest were prepped with chlorhexidine in a sterile fashion, and a sterile drape was applied covering the operative field. Maximum barrier sterile technique with sterile gowns and gloves were used for the procedure. A timeout was performed prior to the initiation of the procedure. After creating a small venotomy incision, a micropuncture kit was utilized to access the internal jugular vein. Real-time ultrasound guidance was utilized for vascular access including the acquisition of a permanent ultrasound image documenting patency of the accessed vessel. The microwire was utilized to measure appropriate catheter length. A stiff Glidewire was advanced to the level of the IVC and the micropuncture sheath was exchanged for a peel-away sheath. A palindrome tunneled hemodialysis catheter measuring 19 cm from tip to cuff was tunneled in a retrograde fashion from the anterior chest wall to the venotomy incision. The catheter was then placed through the peel-away sheath with tips ultimately positioned within the superior aspect of the right atrium. Final catheter positioning was confirmed and documented with a spot radiographic image. The catheter aspirates and flushes normally. The catheter was flushed with appropriate volume heparin dwells. The catheter exit site was secured with a 0-Prolene retention suture.  The venotomy incision was closed with an interrupted 4-0 Vicryl, Dermabond and Steri-strips. Dressings were applied. The patient tolerated the procedure well without immediate post procedural complication. IMPRESSION: Successful placement of 19 cm tip to cuff tunneled hemodialysis catheter via the RIGHT internal jugular vein The tip of the catheter is positioned within the proximal RIGHT atrium. The catheter is ready for immediate use. Roanna Banning, MD [facility] Radiology Electronically Signed   By: Roanna Banning M.D.   On: 05/28/2023 17:12    acetaminophen  1,000 mg Oral Q8H   albuterol  2.5 mg Nebulization BID   Chlorhexidine Gluconate Cloth  6 each Topical Q0600   Chlorhexidine Gluconate Cloth  6 each Topical Q0600   heparin injection (subcutaneous)  5,000 Units Subcutaneous Q8H   heparin sodium (porcine)  3.2 mL Intravenous Once   hydrocortisone cream   Topical BID   hydrOXYzine  25 mg Oral BID   lidocaine  1 patch Transdermal Q24H   lidocaine  1 Application Urethral Daily   lidocaine-EPINEPHrine  10 mL Infiltration Once   melatonin  3 mg Oral QHS   sodium zirconium cyclosilicate  10 g Oral Daily    BMET    Component Value Date/Time   NA 129 (L) 05/29/2023 0026   K 4.3 05/29/2023 0026   CL 92 (L) 05/29/2023 0026   CO2 24 05/29/2023 0026   GLUCOSE 86 05/29/2023 0026   BUN 53 (H) 05/29/2023 0026   CREATININE 5.65 (H) 05/29/2023 0026   CALCIUM 7.2 (L) 05/29/2023 0026   GFRNONAA 7 (L) 05/29/2023 0026   GFRAA >60 10/10/2016 0556   CBC    Component Value Date/Time   WBC 10.1 05/29/2023 0026   RBC 2.64 (L) 05/29/2023 0026   HGB 7.3 (L) 05/29/2023 0609   HCT 21.8 (L) 05/29/2023 0609   PLT 208 05/29/2023 0026   MCV 82.2 05/29/2023 0026   MCH 27.3 05/29/2023 0026   MCHC 33.2 05/29/2023 0026   RDW 15.8 (H) 05/29/2023 0026   LYMPHSABS 1.0 05/25/2023 2319   MONOABS 0.5 05/25/2023 2319   EOSABS 0.1 05/25/2023 2319   BASOSABS 0.0  05/25/2023 2319  Assessment/Plan:  AKI, oliguric - Scr was normal in May.  By history this is most consistent with ischemic ATN in setting of N/V for the past week with concomitant ARB therapy. Unfortunately no urine to evaluate.  No evidence of cortical necrosis on renal US, no NSAID use, time course too short for AIN, no obstruction.  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  Negative ANA, dsDNA, ASO, ANCA, and normal complements.  She had TDC placed 05/28/23 by IR, s/p first HD session early this morning.  Still without significant UOP and poor po intake. IV lost yesterday and declined replacement.  Would continue with IVF's but decrease rate to 50 mL/hr.  Continue to hold losartan.  Plan for another session of HD tomorrow if no significant increase in UOP. Avoid nephrotoxic medications including NSAIDs and iodinated intravenous contrast exposure unless the latter is absolutely indicated.  Preferred narcotic agents for pain control are hydromorphone, fentanyl, and methadone. Morphine should not be used. Avoid Baclofen and avoid oral sodium phosphate and magnesium citrate based laxatives / bowel preps. Continue strict Input and Output monitoring. Will monitor the patient closely with you and intervene or adjust therapy as indicated by changes in clinical status/labs  AGMA - due to #1.  Will switch from lactated ringers to isotonic bicarb due to hyperkalemia.  Hyperkalemia - given lokelma 5 mg x 1, will stop LR and change to isotonic bicarb.  Also increase dose of Lokelma to 10 grams but decrease to daily since she had HD.  Hyponatremia - again likely hypovolemic hyponatremia by history.  Unable to obtain urine sample at this time and will continue to follow.   Will check cortisol level in am to r/o adrenal insufficiency. Elevated troponin - with vague history of chest pain.  Per primary svc. HTN- low bp, losartan on hold due to AKI. Low back pain - chronic Abdominal pain and persistent nausea - CT of  abd/pelvis without source of symptoms. Recommend GI consult.  COPD - per primary svc Normocytic anemia - TSAT 21%, will dose with IV iron and follow.  Transfuse if Hgb drops to 7.   Irena Cords, MD [facility]

## 2023-05-29 NOTE — Progress Notes (Signed)
Patient ID: Samantha Fletcher, female   DOB: Aug 16, 1946, 77 y.o.   MRN: [id number]  Patient placed on left side, knees up to her chest, with  a warm towel to try to reduce pain.  Lidia Collum, RN

## 2023-05-29 NOTE — Progress Notes (Signed)
Received patient in bed to unit.  Alert and oriented.  Informed consent signed and in chart. yes  TX duration:2.5hrs  Patient tolerated well. yes Transported back to the room yes Alert, without acute distress. No distress Hand-off given to patient's nurse. Casimiro Needle RN  Access used: right IJ Access issues: none  Total UF removed: 1000 Medication(s) given: 0 Post HD VS: 119/50 pulse 62 temp-97.7 Post HD weight: 54.3kg   Greer Ee Nazaria[facility]

## 2023-05-29 NOTE — Progress Notes (Addendum)
Interim: Samantha Fletcher is a 77 y.o. with a pertinent PMH of HTN, COPD, paroxysmal AF, chronic pain, and recurring UTIs who presented on 6/30 with back, abdominal and chest pain and admitted to our service for acute renal failure.   Subjective: No overnight events. Still itching and coughing. Endorsed ongoing nausea, no vomiting. Has had poor PO intake since receiving HD 7/03. Reported feeling some SOB with the coughing but saturating WNL with O2 off. Discussed updates to plan of care, patient agreed to have team update sister Darel Hong.   Objective:  Vital signs in last 24 hours: Vitals:   05/26/23 0245 05/26/23 0341 05/26/23 0725 05/26/23 1119  BP: 139/61 (!) 141/61 (!) 139/57 117/67  Pulse: 82 62 61 61  Resp: 19 20 16 20   Temp:  98.2 F (36.8 C) 98.6 F (37 C) 98.5 F (36.9 C)  TempSrc:  Oral Oral Oral  SpO2: 98% 97% 98% 94%  Weight:      Height:          Latest Ref Rng & Units 05/29/2023    6:09 AM 05/29/2023   12:26 AM 05/28/2023    6:41 PM  CBC  WBC 4.0 - 10.5 K/uL  10.1  10.8   Hemoglobin 12.0 - 15.0 g/dL 7.3  7.2  7.1   Hematocrit 36.0 - 46.0 % 21.8  21.7  21.3   Platelets 150 - 400 K/uL  208  201    Renal Functional Panel  Component Ref Range & Units 00:26 (05/29/23) 1 d ago (05/28/23) 1 d ago (05/28/23) 2 d ago (05/27/23) 2 d ago (05/27/23) 2 d ago (05/27/23) 3 d ago (05/26/23)  Sodium 135 - 145 mmol/L 129 Low  126 Low  126 Low  127 Low  126 Low  128 Low  130 Low   Potassium 3.5 - 5.1 mmol/L 4.3 5.1 4.9 5.2 High  5.5 High  5.7 High  4.4  Chloride 98 - 111 mmol/L 92 Low  85 Low  91 Low  97 Low  98 99 96 Low   CO2 22 - 32 mmol/L 24 25 22 17  Low  17 Low  17 Low  15 Low   Glucose, Bld 70 - 99 mg/dL 86 99 CM 91 CM 89 CM 90 CM 89 CM 103 High  CM  Comment: Glucose reference range applies only to samples taken after fasting for at least 8 hours.  BUN 8 - 23 mg/dL 53 High  85 High  81 High  84 High  86 High  86 High  92 High   Creatinine, Ser 0.44 - 1.00 mg/dL 9.56 High  2.13  High  8.00 High  7.79 High  7.62 High  7.75 High  7.68 High   Calcium 8.9 - 10.3 mg/dL 7.2 Low  7.1 Low  7.3 Low  7.3 Low  7.4 Low  7.5 Low  7.7 Low   Phosphorus 2.5 - 4.6 mg/dL 4.6 6.5 High  5.4 High  5.1 High  4.8 High  4.8 High    Albumin 3.5 - 5.0 g/dL 2.2 Low  2.1 Low  2.1 Low  2.2 Low  2.2 Low  2.1 Low    GFR, Estimated >60 mL/min 7 Low  5 Low  CM 5 Low  CM 5 Low  CM 5 Low  CM 5 Low  CM 5 Low  CM  Comment: (NOTE) Calculated using the CKD-EPI Creatinine Equation (2021)  Anion gap 5 - 15 13 16  High  CM 13 CM  13 CM 11 CM 12 CM 19 High  CM  Comment: Performed at [facility], [address], [place], [place] [address]           UA showing leukocytes, protein, and few bacteria  Urine sodium 74 Urine Cr 26 Urine Osmolality 256   In/Outs: Maintenance fluids off, output of 1000 ccs with HD 7/03, 150 ccs from foley bag  Imaging: CT Abdomen/Pelvis w/ oral contrast pending  Physical Exam:  General: Resting in bed, expressed overall discomfort CV: RRR, no murmurs, rubs, or gallops Pulmonary/chest: Audible wheezing with auscultation but no crackles on exam today; normal respiratory effort without supplemental O2 Abdominal: Soft, continues to be tender to palpation Neurological: Alert and oriented to self, place, and situation Skin: Warm and dry  Assessment/Plan: Samantha Fletcher is a 77 y.o. with a history of COPD, HTN, chronic pain, and recurrent UTIs who presented with chest and back pain. She was admitted to our service for acute renal failure.   #Acute Renal Failure #AKI- improving  #Hyperkalemia - resolved #AGMA - resolved  ARF most consistent with ischemic ATN in the setting of N/V with concomitant ARB therapy (losartan) and possible NG/vasculitis. Nephrology initiated a serological workup and recommended continuing to hold home losartan. IV access lost on 7/03, maintenance fluids stopped.   Patient received HD on 7/03 with 1000 ccs removed, no adverse events reported.  Produced 150 ccs of urine over 24 hours, but has had poor PO intake. Maintenance fluids stopped 7/03 with loss of IV access. Patient alert and oriented. Appears uncomfortable but in no acute distress. Complains of cough, itching, and is still experiencing abdominal pain and tenderness. Continues to have nausea on Zofran PRN. Cr 5.65 improved from 8.28 prior to HD, potassium 4.3 WNL. Anion gap 13. Per conversation with Dr. Arrie Aran (nephrology), will consider repeating HD tomorrow 7/05.  Plan:  - Trend Creatinine  - Monitor urine output  - Serial volume status exams - Lidocaine 2% jelly urethral scheduled daily- helped with localized pain - Lidocaine patch on back for pain  - Dilaudid PRN q6 - Scheduled Tylenol q8h  - Cortisone cream for pruritus  - Scheduled hydroxyzine 25 mg BID for pruritus  - Melatonin 3 mg to help with sleep  - Promethazine 25 mg for nausea  - Alum & Mag Hydroxide-simeth q4 PRN for bowel regimen   #Normocytic Anemia  Pt with hemoglobin of 7.2 today, showing a downtrend. Was receiving maintenance fluids and received HD 7/03. Previous admission showed work up with normal B12, folic acid, cortisol, and TSH to evaluate for generalized weakness and fatigue. Does have a history of iron deficiency, with last iron panel done one year ago. Iron WNL 43, TIBC 207.  Decrease could be dilutional given she is making very little urine. Given abdominal pain and down trending Hb, will order CT Abdomen/Pelvis with oral contrast and FOBT to r/o acute bleeding or new obstruction.  Plan:  - Trend Hb - CT Abdomen/Pelvis ordered 7/04 - FOBT ordered 7/04 - Consult GI IF results from CT or FOBT concerning for acute bleed    #Tropinemia- resolved Pt complaining of chest pain, found to have troponin level of 464 on admission, down to 363. Could be in the setting of acute renal failure given decreased clearance of troponins. EKG reassuring with no ST elevations, ST depressions, or T wave  inversions. Currently, pt denies any chest pain, shortness of breath, or vomiting. Continues to have some nausea but it is associated  with eating and Zofran was found to be helpful.TIMI score of 4. Attempted echocardiogram but patient refused as she has been worked up by cardiology in the past and CP has improved. Improved to 363 on 7/2.   #HTN Holding home telmisartan/hctz in the setting of ARF   #COPD Pt has not had formal diagnosis with PFTs, only uses albuterol inhaler PRN.    #Hypothyroidism  On Levothyroxine     Diet: Thin liquids VTE: Subcutaneous Heparin  IVF: None Code: DNR    Prior to Admission Living Arrangement: Home, living with son Anticipated Discharge Location: Post acute inpatient rehab Barriers to Discharge: Medical management Dispo: Anticipated discharge greater than two nights.    Philomena Doheny, MD, PGY-1 05/26/2023, 12:41 PM After 5pm on weekdays and 1pm on weekends: On Call pager [phone]

## 2023-05-29 NOTE — TOC Progression Note (Signed)
Transition of Care [facility]) - Progression Note    Patient Details  Name: Samantha Fletcher MRN: [id number] Date of Birth: 1946-01-21  Transition of Care [facility]) CM/SW Contact  Carmina Miller, [place] Phone Number: 05/29/2023, 1:20 PM  Clinical Narrative:     CSW met with pt in the room, pt on the commode screaming for the RN, pt stating she is having trouble defecating but spoke with CSW reluctantly. CSW spoke with pt about dc plans, PT recommending SNF, pt states she is not going to a SNF, pt states she was just dc from [facility] about two weeks or so, pt states she was there for three weeks. CSW advised pt would be in copay status, pt states she can't afford the copays and that she is going home. Pt states she is from home with her son and states her son is her caretaker. CSW inquired on pt having HH PT/OT, pt denied having anyone come into her home. TOC will continue to follow.   Expected Discharge Plan: Home/Self Care Barriers to Discharge: Continued Medical Work up  Expected Discharge Plan and Services In-house Referral: NA Discharge Planning Services: CM Consult Post Acute Care Choice: NA Living arrangements for the past 2 months: Single Family Home                 DME Arranged: N/A DME Agency: NA       HH Arranged: NA           Social Determinants of Health (SDOH) Interventions SDOH Screenings   Food Insecurity: No Food Insecurity (05/26/2023)  Housing: Low Risk  (05/26/2023)  Transportation Needs: No Transportation Needs (05/26/2023)  Utilities: Not At Risk (05/26/2023)  Tobacco Use: Medium Risk (05/28/2023)    Readmission Risk Interventions     No data to display

## 2023-05-29 NOTE — Progress Notes (Signed)
Patient ID: Samantha Fletcher, female   DOB: Oct 15, 1946, 77 y.o.   MRN: [id number]  Patient in chair eating lunch and suddenly c/o sever cramping in her abdomen. States "its too full in there I can't even breath" Assisted patient to bathroom. Patient attempted BM but was unsuccessful. Upon returning to bed c/o rectal pain. Assessed rectum and it appears abnormal, swolen and bulging. Notified MD, Dr. Colbert Coyer. Will come to bedside to see patient.  Lidia Collum, RN

## 2023-05-29 NOTE — Progress Notes (Signed)
Mobility Specialist Progress Note:   05/29/23 1158  Mobility  Activity Transferred from bed to chair  Level of Assistance Minimal assist, patient does 75% or more  Assistive Device Front wheel walker  Distance Ambulated (ft) 5 ft  Activity Response Tolerated well  Mobility Referral Yes  $Mobility charge 1 Mobility  Mobility Specialist Start Time (ACUTE ONLY) 1145  Mobility Specialist Stop Time (ACUTE ONLY) 1200  Mobility Specialist Time Calculation (min) (ACUTE ONLY) 15 min   Pt received in bed agreeable to mobilize. Pt agreed to transfer to chair. C/o SOB and overall weakness. Pt attempted to transfer on RA but d/t desat to SPO2 84%-88% pts O2 flow increased to 1L/min, VSS. Pt situated in chair w/ call bell in hand, all needs met.  Pre Mobility RA SPO2 98% During Mobility RA SPO2 85%   1L/min SPO2 97% Post Mobility 1L/min SPO2 97%  Thompson Grayer Mobility Specialist  Please contact vis Secure Chat or  Rehab Office [phone]

## 2023-05-30 ENCOUNTER — Inpatient Hospital Stay (HOSPITAL_COMMUNITY): Payer: Medicare Other

## 2023-05-30 ENCOUNTER — Inpatient Hospital Stay (HOSPITAL_COMMUNITY): Payer: Medicare HMO

## 2023-05-30 DIAGNOSIS — N170 Acute kidney failure with tubular necrosis: Secondary | ICD-10-CM | POA: Diagnosis not present

## 2023-05-30 DIAGNOSIS — I10 Essential (primary) hypertension: Secondary | ICD-10-CM | POA: Diagnosis not present

## 2023-05-30 DIAGNOSIS — J449 Chronic obstructive pulmonary disease, unspecified: Secondary | ICD-10-CM | POA: Diagnosis not present

## 2023-05-30 DIAGNOSIS — E039 Hypothyroidism, unspecified: Secondary | ICD-10-CM | POA: Diagnosis not present

## 2023-05-30 LAB — CBC
HCT: 20.1 % — ABNORMAL LOW (ref 36.0–46.0)
HCT: 24.9 % — ABNORMAL LOW (ref 36.0–46.0)
HCT: 24.9 % — ABNORMAL LOW (ref 36.0–46.0)
Hemoglobin: 6.5 g/dL — CL (ref 12.0–15.0)
Hemoglobin: 8.3 g/dL — ABNORMAL LOW (ref 12.0–15.0)
Hemoglobin: 8.3 g/dL — ABNORMAL LOW (ref 12.0–15.0)
MCH: 27.2 pg (ref 26.0–34.0)
MCH: 28 pg (ref 26.0–34.0)
MCH: 27.9 pg (ref 26.0–34.0)
MCHC: 32.3 g/dL (ref 30.0–36.0)
MCHC: 33.3 g/dL (ref 30.0–36.0)
MCHC: 33.3 g/dL (ref 30.0–36.0)
MCV: 84.1 fL (ref 80.0–100.0)
MCV: 84.1 fL (ref 80.0–100.0)
MCV: 83.6 fL (ref 80.0–100.0)
Platelets: 241 10*3/uL (ref 150–400)
Platelets: 254 10*3/uL (ref 150–400)
Platelets: 260 10*3/uL (ref 150–400)
RBC: 2.39 MIL/uL — ABNORMAL LOW (ref 3.87–5.11)
RBC: 2.96 MIL/uL — ABNORMAL LOW (ref 3.87–5.11)
RBC: 2.98 MIL/uL — ABNORMAL LOW (ref 3.87–5.11)
RDW: 16 % — ABNORMAL HIGH (ref 11.5–15.5)
RDW: 17.2 % — ABNORMAL HIGH (ref 11.5–15.5)
RDW: 17.6 % — ABNORMAL HIGH (ref 11.5–15.5)
WBC: 9.7 10*3/uL (ref 4.0–10.5)
WBC: 11 10*3/uL — ABNORMAL HIGH (ref 4.0–10.5)
WBC: 10.9 10*3/uL — ABNORMAL HIGH (ref 4.0–10.5)
nRBC: 0 % (ref 0.0–0.2)
nRBC: 0 % (ref 0.0–0.2)
nRBC: 0 % (ref 0.0–0.2)

## 2023-05-30 LAB — RENAL FUNCTION PANEL
Albumin: 2.2 g/dL — ABNORMAL LOW (ref 3.5–5.0)
Anion gap: 13 (ref 5–15)
BUN: 57 mg/dL — ABNORMAL HIGH (ref 8–23)
CO2: 26 mmol/L (ref 22–32)
Calcium: 7.3 mg/dL — ABNORMAL LOW (ref 8.9–10.3)
Chloride: 91 mmol/L — ABNORMAL LOW (ref 98–111)
Creatinine, Ser: 6.62 mg/dL — ABNORMAL HIGH (ref 0.44–1.00)
GFR, Estimated: 6 mL/min — ABNORMAL LOW (ref >60)
Glucose, Bld: 92 mg/dL (ref 70–99)
Phosphorus: 6.6 mg/dL — ABNORMAL HIGH (ref 2.5–4.6)
Potassium: 4.5 mmol/L (ref 3.5–5.1)
Sodium: 130 mmol/L — ABNORMAL LOW (ref 135–145)

## 2023-05-30 LAB — TYPE AND SCREEN
ABO/RH(D): O NEG
Unit division: 0

## 2023-05-30 LAB — RETICULOCYTES
Immature Retic Fract: 21.4 % — ABNORMAL HIGH (ref 2.3–15.9)
RBC.: 2.99 MIL/uL — ABNORMAL LOW (ref 3.87–5.11)
Retic Count, Absolute: 70.3 10*3/uL (ref 19.0–186.0)
Retic Ct Pct: 2.4 % (ref 0.4–3.1)

## 2023-05-30 LAB — BPAM RBC

## 2023-05-30 LAB — PREPARE RBC (CROSSMATCH)

## 2023-05-30 LAB — LACTATE DEHYDROGENASE: LDH: 541 U/L — ABNORMAL HIGH (ref 98–192)

## 2023-05-30 LAB — OCCULT BLOOD X 1 CARD TO LAB, STOOL: Fecal Occult Bld: NEGATIVE

## 2023-05-30 MED ORDER — SODIUM CHLORIDE 0.9% IV SOLUTION: Freq: Once | INTRAVENOUS | Status: AC

## 2023-05-30 MED ORDER — BENZONATATE 100 MG PO CAPS
100.0000 mg | ORAL_CAPSULE | Freq: Two times a day (BID) | ORAL | Status: DC | PRN
  Administered 2023-05-30 – 2023-06-06 (×2): 100 mg via ORAL
  Filled 2023-05-30 (×2): qty 1

## 2023-05-30 MED ORDER — HEPARIN SODIUM (PORCINE) 1000 UNIT/ML IJ SOLN
INTRAMUSCULAR | Status: AC
  Administered 2023-05-30: 3200 [IU]
  Filled 2023-05-30: qty 4

## 2023-05-30 MED ORDER — FUROSEMIDE 10 MG/ML IJ SOLN
120.0000 mg | Freq: Once | INTRAVENOUS | Status: AC
  Administered 2023-05-30: 120 mg via INTRAVENOUS
  Filled 2023-05-30: qty 10

## 2023-05-30 MED ORDER — LEVOTHYROXINE SODIUM 75 MCG PO TABS
75.0000 ug | ORAL_TABLET | Freq: Every day | ORAL | Status: DC
  Administered 2023-05-30 – 2023-06-09 (×11): 75 ug via ORAL
  Filled 2023-05-30 (×12): qty 1

## 2023-05-30 NOTE — Consult Note (Signed)
   [facility] Inpatient Consult   05/30/2023  DARYLENE OTERO 1946-08-02 [id number]  [facility] [[facility]]  Accountable Care Organization [ACO] Patient: [employer] Medicare  *[facility] Liaison remote coverage review for patient admitted to [facility]  Primary Care Provider:  Marylen Ponto, MD with [facility] Physician  Patient screened for hospitalization with noted high risk score for unplanned readmission risk 4 day length of stay and to assess for potential [facility]  [[facility]] Care Management service needs for post hospital transition for care coordination.  Review of patient's electronic medical record reveals patient is being follow renal insufficiency received HD on 05/28/23 for acute renal failure   Plan:  Continue to follow progress and disposition to assess for post hospital community care coordination/management needs.  Referral request for community care coordination: pending disposition and outcome of post hospital needs.  Of note, [facility] Care Management/Population Health does not replace or interfere with any arrangements made by the Inpatient Transition of Care team.  For questions contact:   Charlesetta Shanks, RN BSN CCM [facility]  [phone] business mobile phone Toll free office [phone]  *Concierge Line  [phone] Fax number: [phone] [email or web address]

## 2023-05-30 NOTE — Progress Notes (Signed)
Received patient in bed.Awake,alert and oriented x 4.Consent verified.  Access used : Right HD catheter that worked well.Dressing on date.  Duration of treatment:. 2.5 hours.  Fluid removed : 1 liter.  Hemo comment: Tolerated treatment.  Hand off to the patient's nurse.

## 2023-05-30 NOTE — TOC Progression Note (Signed)
Transition of Care [facility]) - Progression Note    Patient Details  Name: Samantha Fletcher MRN: [id number] Date of Birth: 1946/02/07  Transition of Care [facility]) CM/SW Contact  Leander Rams, LCSW Phone Number: 05/30/2023, 12:03 PM  Clinical Narrative:    CSW met with pt at bedside to follow up regarding disposition. Pt states she just went to SNF for a couple weeks and will not be going back. Pt also declined HH services. Pt states she will dc home and receive help by her son.   TOC will continue to follow for disposition needs.    Expected Discharge Plan: Home/Self Care Barriers to Discharge: Continued Medical Work up  Expected Discharge Plan and Services In-house Referral: NA Discharge Planning Services: CM Consult Post Acute Care Choice: NA Living arrangements for the past 2 months: Single Family Home                 DME Arranged: N/A DME Agency: NA       HH Arranged: NA           Social Determinants of Health (SDOH) Interventions SDOH Screenings   Food Insecurity: No Food Insecurity (05/26/2023)  Housing: Low Risk  (05/26/2023)  Transportation Needs: No Transportation Needs (05/26/2023)  Utilities: Not At Risk (05/26/2023)  Tobacco Use: Medium Risk (05/28/2023)    Readmission Risk Interventions     No data to display        Oletta Lamas, MSW, LCSWA, LCASA Transitions of Care  Clinical Social Worker I

## 2023-05-30 NOTE — Progress Notes (Addendum)
Patient ID: Samantha Fletcher, female   DOB: Sep 04, 1946, 77 y.o.   MRN: [id number] S: Had first HD session yesterday and tolerated it well.  Still no UOP despite IVF's. O:BP (!) 115/54   Pulse 72   Temp 99.9 F (37.7 C) (Oral)   Resp 19   Ht 5\' 2"  (1.575 m)   Wt 57.2 kg   LMP  (LMP Unknown)   SpO2 99%   BMI 23.06 kg/m   Intake/Output Summary (Last 24 hours) at 05/30/2023 0954 Last data filed at 05/30/2023 0915 Gross per 24 hour  Intake 2172 ml  Output 50 ml  Net 2122 ml   Intake/Output: I/O last 3 completed shifts: In: 1872 [P.O.:377; I.V.:1495] Out: 1150 [Urine:150; Other:1000]  Intake/Output this shift:  Total I/O In: 500 [Blood:500] Out: -  Weight change: -1.2 kg Gen: NAD CVS: RRR Resp:CTA Abd: +BS, soft, +tender to palpation Ext: no edema  Recent Labs  Lab 05/25/23 2319 05/26/23 0003 05/27/23 0030 05/27/23 0705 05/27/23 1603 05/28/23 0026 05/28/23 1841 05/29/23 0026 05/30/23 0043  NA 131*   < > 128* 126* 127* 126* 126* 129* 130*  K 4.5   < > 5.7* 5.5* 5.2* 4.9 5.1 4.3 4.5  CL 94*   < > 99 98 97* 91* 85* 92* 91*  CO2 17*   < > 17* 17* 17* 22 25 24 26   GLUCOSE 98   < > 89 90 89 91 99 86 92  BUN 96*   < > 86* 86* 84* 81* 85* 53* 57*  CREATININE 7.93*   < > 7.75* 7.62* 7.79* 8.00* 8.28* 5.65* 6.62*  ALBUMIN 3.0*  --  2.1* 2.2* 2.2* 2.1* 2.1* 2.2* 2.2*  CALCIUM 8.4*   < > 7.5* 7.4* 7.3* 7.3* 7.1* 7.2* 7.3*  PHOS  --   --  4.8* 4.8* 5.1* 5.4* 6.5* 4.6 6.6*  AST 30  --   --   --   --   --   --   --   --   ALT 19  --   --   --   --   --   --   --   --    < > = values in this interval not displayed.   Liver Function Tests: Recent Labs  Lab 05/25/23 2319 05/27/23 0030 05/28/23 1841 05/29/23 0026 05/30/23 0043  AST 30  --   --   --   --   ALT 19  --   --   --   --   ALKPHOS 67  --   --   --   --   BILITOT 0.5  --   --   --   --   PROT 6.3*  --   --   --   --   ALBUMIN 3.0*   < > 2.1* 2.2* 2.2*   < > = values in this interval not displayed.   Recent Labs   Lab 05/25/23 2319  LIPASE 44   No results for input(s): "AMMONIA" in the last 168 hours. CBC: Recent Labs  Lab 05/25/23 2319 05/26/23 0003 05/28/23 0026 05/28/23 1235 05/28/23 1841 05/29/23 0026 05/29/23 0609 05/30/23 0043  WBC 12.1*   < > 11.0* 11.4* 10.8* 10.1  --  9.7  NEUTROABS 10.3*  --   --   --   --   --   --   --   HGB 9.6*   < > 7.5* 8.4* 7.1* 7.2* 7.3* 6.5*  HCT 29.7*   < >  22.7* 25.0* 21.3* 21.7* 21.8* 20.1*  MCV 84.6   < > 81.9 84.5 81.3 82.2  --  84.1  PLT 153   < > 166 190 201 208  --  241   < > = values in this interval not displayed.   Cardiac Enzymes: No results for input(s): "CKTOTAL", "CKMB", "CKMBINDEX", "TROPONINI" in the last 168 hours. CBG: No results for input(s): "GLUCAP" in the last 168 hours.  Iron Studies: No results for input(s): "IRON", "TIBC", "TRANSFERRIN", "FERRITIN" in the last 72 hours. Studies/Results: IR Fluoro Guide CV Line Right  Result Date: 05/28/2023 INDICATION: ESRD requiring HD. EXAM: TUNNELED CENTRAL VENOUS HEMODIALYSIS CATHETER PLACEMENT WITH ULTRASOUND AND FLUOROSCOPIC GUIDANCE MEDICATIONS: None. ANESTHESIA/SEDATION: Local anesthetic and single agent sedation was employed during this procedure. A total of Versed 1.5 mg was administered intravenously. The patient's level of consciousness and vital signs were monitored continuously by radiology nursing throughout the procedure under my direct supervision. FLUOROSCOPY TIME:  Fluoroscopic dose; 1 mGy COMPLICATIONS: None immediate. PROCEDURE: Informed written consent was obtained from the the patient and/or patient's representative after a discussion of the risks, benefits, and alternatives to treatment. Questions regarding the procedure were encouraged and answered. The RIGHT neck and chest were prepped with chlorhexidine in a sterile fashion, and a sterile drape was applied covering the operative field. Maximum barrier sterile technique with sterile gowns and gloves were used for the  procedure. A timeout was performed prior to the initiation of the procedure. After creating a small venotomy incision, a micropuncture kit was utilized to access the internal jugular vein. Real-time ultrasound guidance was utilized for vascular access including the acquisition of a permanent ultrasound image documenting patency of the accessed vessel. The microwire was utilized to measure appropriate catheter length. A stiff Glidewire was advanced to the level of the IVC and the micropuncture sheath was exchanged for a peel-away sheath. A palindrome tunneled hemodialysis catheter measuring 19 cm from tip to cuff was tunneled in a retrograde fashion from the anterior chest wall to the venotomy incision. The catheter was then placed through the peel-away sheath with tips ultimately positioned within the superior aspect of the right atrium. Final catheter positioning was confirmed and documented with a spot radiographic image. The catheter aspirates and flushes normally. The catheter was flushed with appropriate volume heparin dwells. The catheter exit site was secured with a 0-Prolene retention suture. The venotomy incision was closed with an interrupted 4-0 Vicryl, Dermabond and Steri-strips. Dressings were applied. The patient tolerated the procedure well without immediate post procedural complication. IMPRESSION: Successful placement of 19 cm tip to cuff tunneled hemodialysis catheter via the RIGHT internal jugular vein The tip of the catheter is positioned within the proximal RIGHT atrium. The catheter is ready for immediate use. Roanna Banning, MD Vascular and Interventional Radiology Specialists [facility] Radiology Electronically Signed   By: Roanna Banning M.D.   On: 05/28/2023 17:12   IR US Guide Vasc Access Right  Result Date: 05/28/2023 INDICATION: ESRD requiring HD. EXAM: TUNNELED CENTRAL VENOUS HEMODIALYSIS CATHETER PLACEMENT WITH ULTRASOUND AND FLUOROSCOPIC GUIDANCE MEDICATIONS: None. ANESTHESIA/SEDATION:  Local anesthetic and single agent sedation was employed during this procedure. A total of Versed 1.5 mg was administered intravenously. The patient's level of consciousness and vital signs were monitored continuously by radiology nursing throughout the procedure under my direct supervision. FLUOROSCOPY TIME:  Fluoroscopic dose; 1 mGy COMPLICATIONS: None immediate. PROCEDURE: Informed written consent was obtained from the the patient and/or patient's representative after a discussion of the risks, benefits, and  alternatives to treatment. Questions regarding the procedure were encouraged and answered. The RIGHT neck and chest were prepped with chlorhexidine in a sterile fashion, and a sterile drape was applied covering the operative field. Maximum barrier sterile technique with sterile gowns and gloves were used for the procedure. A timeout was performed prior to the initiation of the procedure. After creating a small venotomy incision, a micropuncture kit was utilized to access the internal jugular vein. Real-time ultrasound guidance was utilized for vascular access including the acquisition of a permanent ultrasound image documenting patency of the accessed vessel. The microwire was utilized to measure appropriate catheter length. A stiff Glidewire was advanced to the level of the IVC and the micropuncture sheath was exchanged for a peel-away sheath. A palindrome tunneled hemodialysis catheter measuring 19 cm from tip to cuff was tunneled in a retrograde fashion from the anterior chest wall to the venotomy incision. The catheter was then placed through the peel-away sheath with tips ultimately positioned within the superior aspect of the right atrium. Final catheter positioning was confirmed and documented with a spot radiographic image. The catheter aspirates and flushes normally. The catheter was flushed with appropriate volume heparin dwells. The catheter exit site was secured with a 0-Prolene retention suture.  The venotomy incision was closed with an interrupted 4-0 Vicryl, Dermabond and Steri-strips. Dressings were applied. The patient tolerated the procedure well without immediate post procedural complication. IMPRESSION: Successful placement of 19 cm tip to cuff tunneled hemodialysis catheter via the RIGHT internal jugular vein The tip of the catheter is positioned within the proximal RIGHT atrium. The catheter is ready for immediate use. Roanna Banning, MD Vascular and Interventional Radiology Specialists [facility] Radiology Electronically Signed   By: Roanna Banning M.D.   On: 05/28/2023 17:12    acetaminophen  1,000 mg Oral Q8H   albuterol  2.5 mg Nebulization BID   Chlorhexidine Gluconate Cloth  6 each Topical Q0600   Chlorhexidine Gluconate Cloth  6 each Topical Q0600   heparin injection (subcutaneous)  5,000 Units Subcutaneous Q8H   heparin sodium (porcine)  3.2 mL Intravenous Once   hydrocortisone cream   Topical BID   hydrOXYzine  25 mg Oral BID   levothyroxine  75 mcg Oral Q0600   lidocaine  1 patch Transdermal Q24H   lidocaine  1 Application Urethral Daily   lidocaine-EPINEPHrine  10 mL Infiltration Once   melatonin  3 mg Oral QHS   pantoprazole  40 mg Oral Daily   polyethylene glycol  17 g Oral Daily   senna-docusate  2 tablet Oral BID   sodium zirconium cyclosilicate  10 g Oral Daily    BMET    Component Value Date/Time   NA 130 (L) 05/30/2023 0043   K 4.5 05/30/2023 0043   CL 91 (L) 05/30/2023 0043   CO2 26 05/30/2023 0043   GLUCOSE 92 05/30/2023 0043   BUN 57 (H) 05/30/2023 0043   CREATININE 6.62 (H) 05/30/2023 0043   CALCIUM 7.3 (L) 05/30/2023 0043   GFRNONAA 6 (L) 05/30/2023 0043   GFRAA >60 10/10/2016 0556   CBC    Component Value Date/Time   WBC 9.7 05/30/2023 0043   RBC 2.39 (L) 05/30/2023 0043   HGB 6.5 (LL) 05/30/2023 0043   HCT 20.1 (L) 05/30/2023 0043   PLT 241 05/30/2023 0043   MCV 84.1 05/30/2023 0043   MCH 27.2 05/30/2023 0043   MCHC 32.3 05/30/2023 0043    RDW 16.0 (H) 05/30/2023 0043   LYMPHSABS 1.0 05/25/2023  2319   MONOABS 0.5 05/25/2023 2319   EOSABS 0.1 05/25/2023 2319   BASOSABS 0.0 05/25/2023 2319     Assessment/Plan:  AKI, oliguric - Scr was normal in May.  By history this is most consistent with ischemic ATN in setting of N/V for the past week with concomitant ARB therapy. Unfortunately no urine to evaluate.  No evidence of cortical necrosis on renal US, no NSAID use, time course too short for AIN, no obstruction.  Negative ANA, dsDNA, ASO, ANCA, and normal complements.  She had TDC placed 05/28/23 by IR, s/p first HD session 05/29/23.  Still without significant UOP and poor po intake. IV lost yesterday but restarted yesterday.  Would continue with IVF's but change to NS @ 50 mL/hr as she is not able to eat or drink much.  Continue to hold losartan.  Plan for another session of HD today since no significant increase in UOP. Avoid nephrotoxic medications including NSAIDs and iodinated intravenous contrast exposure unless the latter is absolutely indicated.  Preferred narcotic agents for pain control are hydromorphone, fentanyl, and methadone. Morphine should not be used. Avoid Baclofen and avoid oral sodium phosphate and magnesium citrate based laxatives / bowel preps. Continue strict Input and Output monitoring. Will monitor the patient closely with you and intervene or adjust therapy as indicated by changes in clinical status/labs  AGMA - due to #1.  Switched to isotonic bicarb with improvement of Co2 to 26.  Will stop IVF''s for now and plan for HD today and follow.   Hyperkalemia - given lokelma 5 mg x 1, will stop LR and change to isotonic bicarb.  Also increase dose of Lokelma to 10 grams but decrease to daily since she had HD.  Hyponatremia - again likely hypovolemic hyponatremia by history.  Unable to obtain urine sample at this time and will continue to follow.   Will check cortisol level in am to r/o adrenal insufficiency. Elevated  troponin - with vague history of chest pain.  Per primary svc. HTN- low bp, losartan on hold due to AKI. Low back pain - chronic Abdominal pain and persistent nausea - CT of abd/pelvis without source of symptoms. Recommend GI consult.  COPD - per primary svc Normocytic anemia - TSAT 21%, will dose with IV iron and follow.  Transfused 1 unit PRBC today with Hgb of 6.5.  Will dose IV lasix x 1 today to see if it will ^ UOP.  Irena Cords, MD [facility]

## 2023-05-30 NOTE — Progress Notes (Addendum)
Interval History: Samantha Fletcher is a 77 y.o. with a pertinent PMH of HTN, COPD, paroxysmal AF, chronic pain, and recurring UTIs who presented on 6/30 with back, abdominal and chest pain and admitted to our service for acute renal failure. Received HD 7/03.   Subjective: Received one unit RBC overnight for Hb 6.5. Infusion was finishing up when team rounded this morning. Still endorsing cough, some SOB ([place] on, 2L O2), and abdominal tenderness and reported her intestines feel "sore". Had a very small bowel movement today, no blood in stool. Less straining compared to BM yesterday. Discussed CXR results with patient, including fluid in lungs, no sign of infection. Discussed upcoming CT without contrast to view abdomen and GI consult. Discussed worry over dropping Hgb. Pt will be getting hemodialysis today.   Objective:  Vital signs in last 24 hours: Vitals:   05/30/23 0915 05/30/23 1124  BP: (!) 115/54 (!) 141/58  Pulse: 72 63  Resp: 19 20  Temp: 99.9 F (37.7 C) 98.5 F (36.9 C)  SpO2: 99% 97%       Latest Ref Rng & Units 05/30/2023   12:43 AM 05/29/2023    6:09 AM 05/29/2023   12:26 AM  CBC  WBC 4.0 - 10.5 K/uL 9.7   10.1   Hemoglobin 12.0 - 15.0 g/dL 6.5  7.3  7.2   Hematocrit 36.0 - 46.0 % 20.1  21.8  21.7   Platelets 150 - 400 K/uL 241   208    Renal Functional Panel  Component Ref Range & Units 00:43 (05/30/23) 1 d ago (05/29/23) 2 d ago (05/28/23) 2 d ago (05/28/23) 3 d ago (05/27/23) 3 d ago (05/27/23) 3 d ago (05/27/23)  Sodium 135 - 145 mmol/L 130 Low  129 Low  126 Low  126 Low  127 Low  126 Low  128 Low   Potassium 3.5 - 5.1 mmol/L 4.5 4.3 5.1 4.9 5.2 High  5.5 High  5.7 High   Chloride 98 - 111 mmol/L 91 Low  92 Low  85 Low  91 Low  97 Low  98 99  CO2 22 - 32 mmol/L 26 24 25 22 17  Low  17 Low  17 Low   Glucose, Bld 70 - 99 mg/dL 92 86 CM 99 CM 91 CM 89 CM 90 CM 89 CM  Comment: Glucose reference range applies only to samples taken after fasting for at least 8 hours.   BUN 8 - 23 mg/dL 57 High  53 High  85 High  81 High  84 High  86 High  86 High   Creatinine, Ser 0.44 - 1.00 mg/dL 6.28 High  3.15 High  1.76 High  8.00 High  7.79 High  7.62 High  7.75 High   Calcium 8.9 - 10.3 mg/dL 7.3 Low  7.2 Low  7.1 Low  7.3 Low  7.3 Low  7.4 Low  7.5 Low   Phosphorus 2.5 - 4.6 mg/dL 6.6 High  4.6 6.5 High  5.4 High  5.1 High  4.8 High  4.8 High   Albumin 3.5 - 5.0 g/dL 2.2 Low  2.2 Low  2.1 Low  2.1 Low  2.2 Low  2.2 Low  2.1 Low   GFR, Estimated >60 mL/min 6 Low  7 Low  CM 5 Low  CM 5 Low  CM 5 Low  CM 5 Low  CM 5 Low  CM  Comment: (NOTE) Calculated using the CKD-EPI Creatinine Equation (2021)  Anion gap 5 -  15 13 13  CM 16 High  CM 13 CM 13 CM 11 CM 12 CM   In/Outs: 50 ccs from foley bag  Imaging:  -CT Abdomen/Pelvis w/o contrast 7/05: 1. Anasarca with moderate bilateral pleural effusions, mild ascites and generalized subcutaneous edema. 2. Patchy ground-glass opacities within the aerated lungs, possibly due to inflammation or edema. 3. No acute abdominopelvic findings identified. 4. Distal colonic diverticulosis without evidence of acute inflammation. 5. Foley catheter in place with resulting decompression of the bladder. Difficult to exclude mild underlying bladder wall thickening. 6.  Aortic Atherosclerosis (ICD10-I70.0). No acute bleeding, obstructions, or masses on imaging.    -CXR 7/05 showing increased bilateral pleural effusions and interstitial edema compared to 7/01 CXR.  Physical Exam:  General: Resting in bed, cooperative, in no acute distress.  CV: RRR, no murmurs, rubs, or gallops. Pulmonary/chest: Bilateral crackles across lung bases, normal respiratory effort with [place] (O2 2L at time of exam).  Abdominal: Soft, continues to be tender to palpation. Neurological: Alert and oriented to self, place, and situation. Skin: Warm and dry.  Assessment/Plan: Samantha Fletcher is a 77 y.o. with a history of COPD, HTN, chronic pain, and recurrent  UTIs who presented with chest and back pain. She was admitted to our service for acute renal failure.   #Acute Renal Failure #AKI- improving  #Hyperkalemia - resolved #AGMA - resolved  #Bilateral pleural effusions  ARF most consistent with ischemic ATN in the setting of N/V with concomitant ARB therapy (losartan) and possible NG/vasculitis. Nephrology initiated a serological workup and recommended continuing to hold home losartan. IV access lost on 7/03, maintenance fluids stopped with IV access lost. Patient received HD on 7/03 with 1000 ccs removed, no adverse events reported. When IV access regained 7/04, paused maintenance sodium bicarbonate due to increased cough and bilateral crackles on lung exam.   Patient continues to complain of cough and abdominal pain and tenderness. CXR showing increased bilateral pleural effusions and interstitial edema compared to CXR on admission. Nausea improved with promethazine. Output last 24 hours of 50 ccs in foley bag. Cr 6.62, potassium 4.5 WNL. Anion gap 13. HD planned for today 7/5, followed by IV Lasix 120 per Dr. Hadley Pen recommendations. Appreciate nephrology following and providing recommendations throughout patient's stay.   Plan:  - Trend Creatinine  - Monitor urine output  - Serial volume status exams - Lidocaine 2% jelly urethral  - Lidocaine patch on back for pain  - Dilaudid PRN q6 - Scheduled Tylenol q8h  - Cortisone cream for pruritus  - Scheduled hydroxyzine 25 mg BID for pruritus  - Melatonin 3 mg to help with sleep  - Promethazine 25 mg for nausea  - HD 7/05 - IV lasix 120 post HD - F/u RFP after HD - Tessalon Perles BID PRN for cough, can consider adding Robitussin if not improved with HD  #Normocytic Anemia  Pt with hemoglobin of 6.5 today requiring 1 unit of RBCs. Received iron infusion with 7/03 HD treatment. Previous admission showed work up with normal B12, folic acid, cortisol, and TSH to evaluate for generalized  weakness and fatigue. Does have a history of iron deficiency, with last iron panel done one year ago. Iron WNL 43, TIBC 207.  Decrease could be dilutional given she is making very little urine. Given abdominal pain and down trending Hb, will order CT Abdomen/Pelvis with oral contrast and FOBT to r/o acute bleeding or new obstruction. Patient was unable to tolerate the oral contrast, so CT Abdomen/Pelvis WO 7/04  not showing acute bleeds, obstructions, or masses. FOBT 7/5 negative, DRE 7/4 negative for blood.  Plan:  - PM CBC 7/05 following HD, plan for CBC w/ differential tomorrow - Trend Hb - Consult GI if Hb continues to downtrend  - Transfuse RBC unit if Hb <7 - Nephrology to manage iron infusions with HD   #Constipation Patient reported straining resulting in rectal prolapse (observed by nurse) before MD exam on 7/04. Added Alum & Mag Hydroxide-simeth q4 PRN to soften stool and maximize bowel regimen.  - Alum & Mag Hydroxide-simeth q4 PRN for bowel regimen    #Tropinemia- resolved Pt complaining of chest pain, found to have troponin level of 464 on admission, down to 363. Could be in the setting of acute renal failure given decreased clearance of troponins. EKG reassuring with no ST elevations, ST depressions, or T wave inversions. Currently, pt denies any chest pain, shortness of breath, or vomiting. Continues to have some nausea but it is associated with eating and Zofran was found to be helpful.TIMI score of 4. Attempted echocardiogram but patient refused as she has been worked up by cardiology in the past and CP has improved. Improved to 363 on 7/2.   #HTN Holding home telmisartan/hctz in the setting of ARF.   #COPD Pt has not had formal diagnosis with PFTs, only uses albuterol inhaler PRN.    #Hypothyroidism  - Resumed Levothyroxine 7/5.    Diet: Thin liquids VTE: Subcutaneous Heparin  IVF: None Code: DNR    Prior to Admission Living Arrangement: Home, living with  son Anticipated Discharge Location: Post acute inpatient rehab Barriers to Discharge: Medical management Dispo: Anticipated discharge greater than two nights.    Philomena Doheny, MD, PGY-1 05/26/2023, 12:41 PM After 5pm on weekdays and 1pm on weekends: On Call pager [phone]

## 2023-05-30 NOTE — TOC Transition Note (Signed)
\  Transition of Care [facility]) - CM/SW Discharge Note   Patient Details  Name: Samantha Fletcher MRN: [id number] Date of Birth: Mar 31, 1946  Transition [facility]) CM/SW Contact:  Helene Kelp, LCSW Phone Number: 05/30/2023, 1:36 PM   Clinical Narrative:         Barriers to Discharge: Continued Medical Work up   Patient Goals and CMS Choice   Choice offered to / list presented to : NA  Discharge Placement                         Discharge Plan and Services Additional resources added to the After Visit Summary for   In-house Referral: NA Discharge Planning Services: CM Consult Post Acute Care Choice: NA          DME Arranged: N/A DME Agency: NA       HH Arranged: NA          Social Determinants of Health (SDOH) Interventions SDOH Screenings   Food Insecurity: No Food Insecurity (05/26/2023)  Housing: Low Risk  (05/26/2023)  Transportation Needs: No Transportation Needs (05/26/2023)  Utilities: Not At Risk (05/26/2023)  Tobacco Use: Medium Risk (05/28/2023)     Readmission Risk Interventions     No data to display

## 2023-05-30 NOTE — Progress Notes (Addendum)
Physical Therapy Treatment Patient Details Name: Samantha Fletcher MRN: [id number] DOB: [birth year] Today's Date: 05/30/2023   History of Present Illness 77 yo female admitted 05/25/23 with AKI, UTI, decreased PT intact with N&V.  She has been anuric now with foley.  Elevated troponins as well with concern for NSTEMI.  PMHx:  anxiety, depression, memory changes, COPD, CKD3a, hypothyroidism, HTN, chronic pain, PAF, hyponatremia, CVA, weakness, fibromyalgia, OSA not on CPAP and vertigo.    PT Comments  Pt refused mobility this session despite education and encouragement.  She was agreeable to participate in bed level exercises.  Pt remains to hinder her progress by refusing OOB mobility.  Continue to recommend rehab in a post acute setting.       Assistance Recommended at Discharge    If plan is discharge home, recommend the following:  Can travel by private vehicle    A lot of help with walking and/or transfers;A lot of help with bathing/dressing/bathroom;Direct supervision/assist for medications management;Direct supervision/assist for financial management;Assist for transportation;Assistance with cooking/housework   No  Equipment Recommendations  None recommended by PT    Recommendations for Other Services       Precautions / Restrictions Precautions Precautions: Fall Precaution Comments: hx of multiple falls Restrictions Weight Bearing Restrictions: No     Mobility  Bed Mobility               General bed mobility comments: refused any formal bed mobility but did follow commands to boost in supine to [name] using L arm to pull on the rail.    Transfers                        Ambulation/Gait                   Stairs             Wheelchair Mobility     Tilt Bed    Modified Rankin (Stroke Patients Only)       Balance                                            Cognition Arousal/Alertness: Awake/alert Behavior During  Therapy: WFL for tasks assessed/performed Overall Cognitive Status: No family/caregiver present to determine baseline cognitive functioning                                 General Comments: pt with hx of dementia, poor memory and awareness of deficits        Exercises General Exercises - Lower Extremity Ankle Circles/Pumps: AROM, Both, 10 reps, Supine Quad Sets: AROM, Both, 10 reps, Supine Heel Slides: AROM, Both, 10 reps, Supine Hip ABduction/ADduction: AROM, Both, 10 reps, Supine Straight Leg Raises: AAROM, Both, 10 reps, Supine, Limitations Straight Leg Raises Limitations: B extensor lag noted.    General Comments        Pertinent Vitals/Pain Pain Assessment Pain Assessment: No/denies pain    Home Living                          Prior Function            PT Goals (current goals can now be found in the care plan section) Acute Rehab PT Goals Patient Stated Goal:  per sister to get to rehab prior to home Potential to Achieve Goals: Fair Progress towards PT goals: Progressing toward goals    Frequency    Min 1X/week      PT Plan Current plan remains appropriate    Co-evaluation              AM-PAC PT "6 Clicks" Mobility   Outcome Measure  Help needed turning from your back to your side while in a flat bed without using bedrails?: Total Help needed moving from lying on your back to sitting on the side of a flat bed without using bedrails?: Total Help needed moving to and from a bed to a chair (including a wheelchair)?: Total Help needed standing up from a chair using your arms (e.g., wheelchair or bedside chair)?: Total Help needed to walk in hospital room?: Total Help needed climbing 3-5 steps with a railing? : Total 6 Click Score: 6    End of Session   Activity Tolerance: Treatment limited secondary to agitation Patient left: in bed;with call bell/phone within reach (unable to set bed alarm as bed plug is broken and alarm  will not set on battery power.)   PT Visit Diagnosis: Repeated falls (R29.6);Muscle weakness (generalized) (M62.81);Dizziness and giddiness (R42);Other abnormalities of gait and mobility (R26.89)     Time: 9147-8295 PT Time Calculation (min) (ACUTE ONLY): 12 min  Charges:    $Therapeutic Exercise: 8-22 mins PT General Charges $$ ACUTE PT VISIT: 1 Visit                     Samantha Fletcher , PTA Acute Rehabilitation Services Office [phone]    Samantha Fletcher 05/30/2023, 12:32 PM

## 2023-05-31 DIAGNOSIS — M549 Dorsalgia, unspecified: Secondary | ICD-10-CM

## 2023-05-31 DIAGNOSIS — N170 Acute kidney failure with tubular necrosis: Secondary | ICD-10-CM

## 2023-05-31 DIAGNOSIS — Z87891 Personal history of nicotine dependence: Secondary | ICD-10-CM

## 2023-05-31 DIAGNOSIS — I10 Essential (primary) hypertension: Secondary | ICD-10-CM

## 2023-05-31 DIAGNOSIS — D649 Anemia, unspecified: Secondary | ICD-10-CM

## 2023-05-31 DIAGNOSIS — J90 Pleural effusion, not elsewhere classified: Secondary | ICD-10-CM | POA: Diagnosis not present

## 2023-05-31 DIAGNOSIS — J9601 Acute respiratory failure with hypoxia: Secondary | ICD-10-CM

## 2023-05-31 DIAGNOSIS — D72829 Elevated white blood cell count, unspecified: Secondary | ICD-10-CM

## 2023-05-31 DIAGNOSIS — E875 Hyperkalemia: Secondary | ICD-10-CM

## 2023-05-31 DIAGNOSIS — G8929 Other chronic pain: Secondary | ICD-10-CM

## 2023-05-31 DIAGNOSIS — J811 Chronic pulmonary edema: Secondary | ICD-10-CM

## 2023-05-31 DIAGNOSIS — J810 Acute pulmonary edema: Secondary | ICD-10-CM | POA: Insufficient documentation

## 2023-05-31 LAB — RENAL FUNCTION PANEL
Albumin: 2.3 g/dL — ABNORMAL LOW (ref 3.5–5.0)
Albumin: 2.2 g/dL — ABNORMAL LOW (ref 3.5–5.0)
Anion gap: 10 (ref 5–15)
Anion gap: 15 (ref 5–15)
BUN: 36 mg/dL — ABNORMAL HIGH (ref 8–23)
BUN: 39 mg/dL — ABNORMAL HIGH (ref 8–23)
CO2: 26 mmol/L (ref 22–32)
CO2: 26 mmol/L (ref 22–32)
Calcium: 7.5 mg/dL — ABNORMAL LOW (ref 8.9–10.3)
Calcium: 7.5 mg/dL — ABNORMAL LOW (ref 8.9–10.3)
Chloride: 94 mmol/L — ABNORMAL LOW (ref 98–111)
Chloride: 93 mmol/L — ABNORMAL LOW (ref 98–111)
Creatinine, Ser: 5.2 mg/dL — ABNORMAL HIGH (ref 0.44–1.00)
Creatinine, Ser: 5.48 mg/dL — ABNORMAL HIGH (ref 0.44–1.00)
GFR, Estimated: 8 mL/min — ABNORMAL LOW (ref >60)
GFR, Estimated: 8 mL/min — ABNORMAL LOW (ref >60)
Glucose, Bld: 106 mg/dL — ABNORMAL HIGH (ref 70–99)
Glucose, Bld: 113 mg/dL — ABNORMAL HIGH (ref 70–99)
Phosphorus: 5.1 mg/dL — ABNORMAL HIGH (ref 2.5–4.6)
Phosphorus: 4.7 mg/dL — ABNORMAL HIGH (ref 2.5–4.6)
Potassium: 4.6 mmol/L (ref 3.5–5.1)
Potassium: 4.2 mmol/L (ref 3.5–5.1)
Sodium: 130 mmol/L — ABNORMAL LOW (ref 135–145)
Sodium: 134 mmol/L — ABNORMAL LOW (ref 135–145)

## 2023-05-31 LAB — HEPATIC FUNCTION PANEL
ALT: 7 U/L (ref 0–44)
AST: 18 U/L (ref 15–41)
Albumin: 2.3 g/dL — ABNORMAL LOW (ref 3.5–5.0)
Alkaline Phosphatase: 66 U/L (ref 38–126)
Bilirubin, Direct: <0.1 mg/dL (ref 0.0–0.2)
Total Bilirubin: 0.3 mg/dL (ref 0.3–1.2)
Total Protein: 5.1 g/dL — ABNORMAL LOW (ref 6.5–8.1)

## 2023-05-31 LAB — CBC
HCT: 23.4 % — ABNORMAL LOW (ref 36.0–46.0)
Hemoglobin: 7.5 g/dL — ABNORMAL LOW (ref 12.0–15.0)
MCH: 27.1 pg (ref 26.0–34.0)
MCHC: 32.1 g/dL (ref 30.0–36.0)
MCV: 84.5 fL (ref 80.0–100.0)
Platelets: 306 10*3/uL (ref 150–400)
RBC: 2.77 MIL/uL — ABNORMAL LOW (ref 3.87–5.11)
RDW: 17.8 % — ABNORMAL HIGH (ref 11.5–15.5)
WBC: 12.3 10*3/uL — ABNORMAL HIGH (ref 4.0–10.5)
nRBC: 0 % (ref 0.0–0.2)

## 2023-05-31 LAB — CBC WITH DIFFERENTIAL/PLATELET
Abs Immature Granulocytes: 0.17 10*3/uL — ABNORMAL HIGH (ref 0.00–0.07)
Basophils Absolute: 0 10*3/uL (ref 0.0–0.1)
Basophils Relative: 0 %
Eosinophils Absolute: 0.5 10*3/uL (ref 0.0–0.5)
Eosinophils Relative: 4 %
HCT: 23.1 % — ABNORMAL LOW (ref 36.0–46.0)
Hemoglobin: 7.7 g/dL — ABNORMAL LOW (ref 12.0–15.0)
Immature Granulocytes: 2 %
Lymphocytes Relative: 8 %
Lymphs Abs: 0.8 10*3/uL (ref 0.7–4.0)
MCH: 27.7 pg (ref 26.0–34.0)
MCHC: 33.3 g/dL (ref 30.0–36.0)
MCV: 83.1 fL (ref 80.0–100.0)
Monocytes Absolute: 0.4 10*3/uL (ref 0.1–1.0)
Monocytes Relative: 4 %
Neutro Abs: 9.2 10*3/uL — ABNORMAL HIGH (ref 1.7–7.7)
Neutrophils Relative %: 82 %
Platelets: 289 10*3/uL (ref 150–400)
RBC: 2.78 MIL/uL — ABNORMAL LOW (ref 3.87–5.11)
RDW: 17.5 % — ABNORMAL HIGH (ref 11.5–15.5)
WBC: 11.1 10*3/uL — ABNORMAL HIGH (ref 4.0–10.5)
nRBC: 0 % (ref 0.0–0.2)

## 2023-05-31 LAB — TYPE AND SCREEN: Antibody Screen: NEGATIVE

## 2023-05-31 LAB — BPAM RBC
Blood Product Expiration Date: 202407082359
ISSUE DATE / TIME: 202407050457
Unit Type and Rh: 9500

## 2023-05-31 LAB — SUSCEPTIBILITY RESULT

## 2023-05-31 LAB — SUSCEPTIBILITY, AER + ANAEROB

## 2023-05-31 LAB — FERRITIN: Ferritin: 543 ng/mL — ABNORMAL HIGH (ref 11–307)

## 2023-05-31 MED ORDER — LIDOCAINE HCL (PF) 1 % IJ SOLN: 5.0000 mL | INTRAMUSCULAR | Status: DC | PRN

## 2023-05-31 MED ORDER — LIDOCAINE-PRILOCAINE 2.5-2.5 % EX CREA: 1.0000 | TOPICAL_CREAM | CUTANEOUS | Status: DC | PRN

## 2023-05-31 MED ORDER — ALTEPLASE 2 MG IJ SOLR: 2.0000 mg | Freq: Once | INTRAMUSCULAR | Status: DC | PRN

## 2023-05-31 MED ORDER — ANTICOAGULANT SODIUM CITRATE 4% (200MG/5ML) IV SOLN: 5.0000 mL | Status: DC | PRN

## 2023-05-31 MED ORDER — PENTAFLUOROPROP-TETRAFLUOROETH EX AERO: 1.0000 | INHALATION_SPRAY | CUTANEOUS | Status: DC | PRN

## 2023-05-31 MED ORDER — HEPARIN SODIUM (PORCINE) 1000 UNIT/ML DIALYSIS
1000.0000 [IU] | INTRAMUSCULAR | Status: DC | PRN
  Filled 2023-05-31: qty 1

## 2023-05-31 NOTE — Progress Notes (Addendum)
Patient ID: Samantha Fletcher, female   DOB: 1946-07-09, 77 y.o.   MRN: [id number] S: Having some trouble breathing today O:BP (!) 142/66 (BP Location: Left Arm)   Pulse 62   Temp 98 F (36.7 C) (Oral)   Resp 18   Ht 5\' 2"  (1.575 m)   Wt 59.1 kg   LMP  (LMP Unknown)   SpO2 97%   BMI 23.83 kg/m   Intake/Output Summary (Last 24 hours) at 05/31/2023 1028 Last data filed at 05/31/2023 0000 Gross per 24 hour  Intake 659 ml  Output 1105 ml  Net -446 ml   Intake/Output: I/O last 3 completed shifts: In: 1159 [P.O.:597; Blood:500; IV Piggyback:62] Out: 1155 [Urine:155; Other:1000]  Intake/Output this shift:  No intake/output data recorded. Weight change: 0.5 kg Gen: NAD CVS: RRR Resp: scattered rhonchi bilaterally Abd: +BS< soft, NT/ND Ext: no edema  Recent Labs  Lab 05/25/23 2319 05/26/23 0003 05/27/23 0705 05/27/23 1603 05/28/23 0026 05/28/23 1841 05/29/23 0026 05/30/23 0043 05/31/23 0036  NA 131*   < > 126* 127* 126* 126* 129* 130* 130*  K 4.5   < > 5.5* 5.2* 4.9 5.1 4.3 4.5 4.6  CL 94*   < > 98 97* 91* 85* 92* 91* 94*  CO2 17*   < > 17* 17* 22 25 24 26 26   GLUCOSE 98   < > 90 89 91 99 86 92 106*  BUN 96*   < > 86* 84* 81* 85* 53* 57* 36*  CREATININE 7.93*   < > 7.62* 7.79* 8.00* 8.28* 5.65* 6.62* 5.20*  ALBUMIN 3.0*   < > 2.2* 2.2* 2.1* 2.1* 2.2* 2.2* 2.3*  2.3*  CALCIUM 8.4*   < > 7.4* 7.3* 7.3* 7.1* 7.2* 7.3* 7.5*  PHOS  --    < > 4.8* 5.1* 5.4* 6.5* 4.6 6.6* 5.1*  AST 30  --   --   --   --   --   --   --  18  ALT 19  --   --   --   --   --   --   --  7   < > = values in this interval not displayed.   Liver Function Tests: Recent Labs  Lab 05/25/23 2319 05/27/23 0030 05/29/23 0026 05/30/23 0043 05/31/23 0036  AST 30  --   --   --  18  ALT 19  --   --   --  7  ALKPHOS 67  --   --   --  66  BILITOT 0.5  --   --   --  0.3  PROT 6.3*  --   --   --  5.1*  ALBUMIN 3.0*   < > 2.2* 2.2* 2.3*  2.3*   < > = values in this interval not displayed.   Recent Labs   Lab 05/25/23 2319  LIPASE 44   No results for input(s): "AMMONIA" in the last 168 hours. CBC: Recent Labs  Lab 05/25/23 2319 05/26/23 0003 05/29/23 0026 05/29/23 0609 05/30/23 0043 05/30/23 1121 05/30/23 1851 05/31/23 0036  WBC 12.1*   < > 10.1  --  9.7 11.0* 10.9* 11.1*  NEUTROABS 10.3*  --   --   --   --   --   --  9.2*  HGB 9.6*   < > 7.2*   < > 6.5* 8.3* 8.3* 7.7*  HCT 29.7*   < > 21.7*   < > 20.1* 24.9* 24.9* 23.1*  MCV 84.6   < > 82.2  --  84.1 84.1 83.6 83.1  PLT 153   < > 208  --  241 254 260 289   < > = values in this interval not displayed.   Cardiac Enzymes: No results for input(s): "CKTOTAL", "CKMB", "CKMBINDEX", "TROPONINI" in the last 168 hours. CBG: No results for input(s): "GLUCAP" in the last 168 hours.  Iron Studies:  Recent Labs    05/31/23 0036  FERRITIN 543*   Studies/Results: CT ABDOMEN PELVIS WO CONTRAST  Result Date: 05/30/2023 CLINICAL DATA:  Abdominal pain, acute, nonlocalized. Acute renal failure. EXAM: CT ABDOMEN AND PELVIS WITHOUT CONTRAST TECHNIQUE: Multidetector CT imaging of the abdomen and pelvis was performed following the standard protocol without IV contrast. RADIATION DOSE REDUCTION: This exam was performed according to the departmental dose-optimization program which includes automated exposure control, adjustment of the mA and/or kV according to patient size and/or use of iterative reconstruction technique. COMPARISON:  Abdominopelvic CT 05/26/2023 and 01/11/2023. FINDINGS: Lower chest: There are moderate bilateral pleural effusions with increased compressive atelectasis in both lower lobes. Patchy ground-glass opacities are present within the aerated lungs, possibly due to inflammation or edema. Evidence of underlying subpleural scarring in both lungs. Central venous catheter tip extends to the upper right atrium. There is diffuse aortic and coronary artery atherosclerosis. Hepatobiliary: The liver appears unremarkable as imaged in the  noncontrast state. No evidence of gallstones, gallbladder wall thickening or biliary dilatation. Pancreas: Mildly atrophied. No focal abnormality, ductal dilatation or surrounding inflammation identified. Spleen: Normal in size without focal abnormality. Adrenals/Urinary Tract: Both adrenal glands appear normal. No evidence of urinary tract calculus, suspicious renal lesion or hydronephrosis. Stable renal vascular calcifications bilaterally and mild cortical thinning. A Foley catheter is in place with resulting decompression of the bladder. Difficult to exclude mild underlying bladder wall thickening. Stomach/Bowel: Some enteric contrast material is present within the stomach and colon. The stomach appears unremarkable for its degree of distension. No evidence of bowel wall thickening, distention or surrounding inflammatory change. The appendix is not clearly seen and may be surgically absent. There is distal colonic diverticulosis without evidence of acute inflammation. Vascular/Lymphatic: There are no enlarged abdominal or pelvic lymph nodes. Extensive aortic and branch vessel atherosclerosis without evidence of aneurysm. Reproductive: Status post hysterectomy. No evidence of adnexal mass. Other: Generalize subcutaneous edema with scattered subcutaneous calcifications in both buttocks attributed to prior injections. Relatively mild edema within the intra-abdominal fat. Trace ascites. No pneumoperitoneum. Musculoskeletal: No acute or significant osseous findings. Thoracic spinal stimulator, previous L4-5 PLIF and multilevel spondylosis are noted. Unless specific follow-up recommendations are mentioned in the findings or impression sections, no imaging follow-up of any mentioned incidental findings is recommended. IMPRESSION: 1. Anasarca with moderate bilateral pleural effusions, mild ascites and generalized subcutaneous edema. 2. Patchy ground-glass opacities within the aerated lungs, possibly due to inflammation  or edema. 3. No acute abdominopelvic findings identified. 4. Distal colonic diverticulosis without evidence of acute inflammation. 5. Foley catheter in place with resulting decompression of the bladder. Difficult to exclude mild underlying bladder wall thickening. 6.  Aortic Atherosclerosis (ICD10-I70.0). Electronically Signed   By: Carey Bullocks M.D.   On: 05/30/2023 11:27   DG CHEST PORT 1 VIEW  Result Date: 05/30/2023 CLINICAL DATA:  Acute renal failure.  Cough. EXAM: PORTABLE CHEST 1 VIEW COMPARISON:  05/26/2023 FINDINGS: Interval placement of a right chest wall dialysis catheter with tips in the right atrium. No pneumothorax identified. Stable cardiomediastinal contours. Dorsal column stimulator is noted  in the midthoracic canal. Interval increase in bilateral pleural effusions and interstitial edema compared with the previous exam. Atelectasis and volume loss noted within both lower lobes. IMPRESSION: 1. Interval placement of a right chest wall dialysis catheter with tips in the right atrium. No pneumothorax identified. 2. Interval increase in bilateral pleural effusions and interstitial edema compared with the previous exam. Electronically Signed   By: Signa Kell M.D.   On: 05/30/2023 10:16    acetaminophen  1,000 mg Oral Q8H   albuterol  2.5 mg Nebulization BID   Chlorhexidine Gluconate Cloth  6 each Topical Q0600   Chlorhexidine Gluconate Cloth  6 each Topical Q0600   heparin injection (subcutaneous)  5,000 Units Subcutaneous Q8H   heparin sodium (porcine)  3.2 mL Intravenous Once   hydrocortisone cream   Topical BID   hydrOXYzine  25 mg Oral BID   levothyroxine  75 mcg Oral Q0600   lidocaine  1 patch Transdermal Q24H   lidocaine  1 Application Urethral Daily   lidocaine-EPINEPHrine  10 mL Infiltration Once   melatonin  3 mg Oral QHS   pantoprazole  40 mg Oral Daily   polyethylene glycol  17 g Oral Daily   senna-docusate  2 tablet Oral BID   sodium zirconium cyclosilicate  10 g  Oral Daily    BMET    Component Value Date/Time   NA 130 (L) 05/31/2023 0036   K 4.6 05/31/2023 0036   CL 94 (L) 05/31/2023 0036   CO2 26 05/31/2023 0036   GLUCOSE 106 (H) 05/31/2023 0036   BUN 36 (H) 05/31/2023 0036   CREATININE 5.20 (H) 05/31/2023 0036   CALCIUM 7.5 (L) 05/31/2023 0036   GFRNONAA 8 (L) 05/31/2023 0036   GFRAA >60 10/10/2016 0556   CBC    Component Value Date/Time   WBC 11.1 (H) 05/31/2023 0036   RBC 2.78 (L) 05/31/2023 0036   HGB 7.7 (L) 05/31/2023 0036   HCT 23.1 (L) 05/31/2023 0036   PLT 289 05/31/2023 0036   MCV 83.1 05/31/2023 0036   MCH 27.7 05/31/2023 0036   MCHC 33.3 05/31/2023 0036   RDW 17.5 (H) 05/31/2023 0036   LYMPHSABS 0.8 05/31/2023 0036   MONOABS 0.4 05/31/2023 0036   EOSABS 0.5 05/31/2023 0036   BASOSABS 0.0 05/31/2023 0036     Assessment/Plan:  AKI, oliguric - Scr was normal in May.  By history this is most consistent with ischemic ATN in setting of N/V for the past week with concomitant ARB therapy. Unfortunately no urine to evaluate.  No evidence of cortical necrosis on renal US, no NSAID use, time course too short for AIN, no obstruction.  Negative ANA, dsDNA, ASO, ANCA, and normal complements.  She had TDC placed 05/28/23 by IR, s/p first HD session 05/29/23 and second on 05/30/23.  Some UOP overnight and improving po intake.  Notified of CXR with pleural effusions and pulmonary edema.  Will stop IVF's and plan for another session of HD today with UF.   Avoid nephrotoxic medications including NSAIDs and iodinated intravenous contrast exposure unless the latter is absolutely indicated.  Preferred narcotic agents for pain control are hydromorphone, fentanyl, and methadone. Morphine should not be used. Avoid Baclofen and avoid oral sodium phosphate and magnesium citrate based laxatives / bowel preps. Continue strict Input and Output monitoring. Will monitor the patient closely with you and intervene or adjust therapy as indicated by changes in  clinical status/labs  AGMA - due to #1.  Switched to isotonic bicarb  with improvement of Co2 to 26.  Now off of bicarb.   Hyperkalemia - given lokelma 5 mg x 1, will stop LR and change to isotonic bicarb.  Also increase dose of Lokelma to 10 grams but decrease to daily since she had HD.  Hyponatremia - again likely hypovolemic hyponatremia by history.  Unable to obtain urine sample at this time and will continue to follow.   Will check cortisol level in am to r/o adrenal insufficiency. Elevated troponin - with vague history of chest pain.  Per primary svc. HTN- low bp, losartan on hold due to AKI. Low back pain - chronic Abdominal pain and persistent nausea - CT of abd/pelvis without source of symptoms. Recommend GI consult.  COPD - per primary svc Normocytic anemia - TSAT 21%, will dose with IV iron and follow.  Transfused 1 unit PRBC 05/30/23 and Hgb 7.7 today.  Irena Cords, MD [facility]

## 2023-05-31 NOTE — Plan of Care (Signed)

## 2023-05-31 NOTE — Progress Notes (Signed)
Notified Dr. Daiva Eves that patient complains of feeling like she is "choking", but explains that this feeling is in her lungs. All VSS and patient does not appear in acute distress. Also notified that patient complains of feeling "cloudy" and that "something is wrong with my brain and head". MD states she will consult with Nephrology and enter any needed orders.

## 2023-05-31 NOTE — Progress Notes (Signed)
Hospital day#5 Subjective:   Summary: Samantha Fletcher with a pertinent past medical history of HTN, COPD, paroxysmal atrial fibrillation, chronic pain, recurrent UTIs on hospital day 5 after being admitted for AKI likely secondary to ischemic ATN with oliguria s/p large volume of IVF therapy c/b pulmonary edema and pleural effusions currently undergoing HD  Overnight Events: Patient with more anxiety and worsening dyspnea   Patient is concerned this morning about her medical course. Still feels fuzzy in the head and continued to reports dyspnea at baseline mildly improved by her O2. She reports her abdominal pain, nausea, and bloating have resolved since passing BM. She continues to pass flatus. She has also been able to eat more food compared to earlier int the week. No blood per mouth or rectum.    Patient is unable to lay completely flat in bed today and has worsening cough today.  Objective:  Vital signs in last 24 hours: Vitals:   05/31/23 0402 05/31/23 0719 05/31/23 0844 05/31/23 1125  BP: 127/67 (!) 142/66  127/70  Pulse: 62 62  65  Resp: 18 18  19   Temp: 98.3 F (36.8 C) 98 F (36.7 C)  98.5 F (36.9 C)  TempSrc: Oral Oral  Oral  SpO2: 100% 100% 97% 98%  Weight: 59.1 kg     Height:       Supplemental O2: Nasal Cannula SpO2: 98 % O2 Flow Rate (L/min): 2 L/min FiO2 (%): 28 % Filed Weights   05/30/23 1504 05/30/23 1809 05/31/23 0402  Weight: 57.7 kg 56.7 kg 59.1 kg    Physical Exam:  Constitutional: Sitting up in bed in NAD HENT: Moist mucus membranes Neck: No visible JVP elevation Cardiovascular: regular rate and rhythm, no m/r/g Pulmonary/Chest: normal WOB on 1L [name], speaking in full sentences, decreased lung sounds along the bases with crackles present on bibasilar bases. Mild expiratory wheezing throughout Abdominal: Normal BS, soft, non-tender, non-distended.  MSK: normal bulk and tone. No pitting edema  Neurological: alert & oriented x 3 Skin: warm and  dry Psych: Sad mood and affect    Intake/Output Summary (Last 24 hours) at 05/31/2023 1333 Last data filed at 05/31/2023 1302 Gross per 24 hour  Intake 836 ml  Output 1105 ml  Net -269 ml   Net IO Since Admission: 4,721.69 mL [05/31/23 1333]  Pertinent Labs:    Latest Ref Rng & Units 05/31/2023   12:36 AM 05/30/2023    6:51 PM 05/30/2023   11:21 AM  CBC  WBC 4.0 - 10.5 K/uL 11.1  10.9  11.0   Hemoglobin 12.0 - 15.0 g/dL 7.7  8.3  8.3   Hematocrit 36.0 - 46.0 % 23.1  24.9  24.9   Platelets 150 - 400 K/uL 289  260  254        Latest Ref Rng & Units 05/31/2023   12:36 AM 05/30/2023   12:43 AM 05/29/2023   12:26 AM  CMP  Glucose 70 - 99 mg/dL 098  92  86   BUN 8 - 23 mg/dL 36  57  53   Creatinine 0.44 - 1.00 mg/dL 1.19  1.47  8.29   Sodium 135 - 145 mmol/L 130  130  129   Potassium 3.5 - 5.1 mmol/L 4.6  4.5  4.3   Chloride 98 - 111 mmol/L 94  91  92   CO2 22 - 32 mmol/L 26  26  24    Calcium 8.9 - 10.3 mg/dL 7.5  7.3  7.2  Total Protein 6.5 - 8.1 g/dL 5.1     Total Bilirubin 0.3 - 1.2 mg/dL 0.3     Alkaline Phos 38 - 126 U/L 66     AST 15 - 41 U/L 18     ALT 0 - 44 U/L 7       Imaging: CT ABDOMEN PELVIS WO CONTRAST  Result Date: 05/30/2023 CLINICAL DATA:  Abdominal pain, acute, nonlocalized. Acute renal failure. EXAM: CT ABDOMEN AND PELVIS WITHOUT CONTRAST TECHNIQUE: Multidetector CT imaging of the abdomen and pelvis was performed following the standard protocol without IV contrast. RADIATION DOSE REDUCTION: This exam was performed according to the departmental dose-optimization program which includes automated exposure control, adjustment of the mA and/or kV according to patient size and/or use of iterative reconstruction technique. COMPARISON:  Abdominopelvic CT 05/26/2023 and 01/11/2023. FINDINGS: Lower chest: There are moderate bilateral pleural effusions with increased compressive atelectasis in both lower lobes. Patchy ground-glass opacities are present within the aerated lungs,  possibly due to inflammation or edema. Evidence of underlying subpleural scarring in both lungs. Central venous catheter tip extends to the upper right atrium. There is diffuse aortic and coronary artery atherosclerosis. Hepatobiliary: The liver appears unremarkable as imaged in the noncontrast state. No evidence of gallstones, gallbladder wall thickening or biliary dilatation. Pancreas: Mildly atrophied. No focal abnormality, ductal dilatation or surrounding inflammation identified. Spleen: Normal in size without focal abnormality. Adrenals/Urinary Tract: Both adrenal glands appear normal. No evidence of urinary tract calculus, suspicious renal lesion or hydronephrosis. Stable renal vascular calcifications bilaterally and mild cortical thinning. A Foley catheter is in place with resulting decompression of the bladder. Difficult to exclude mild underlying bladder wall thickening. Stomach/Bowel: Some enteric contrast material is present within the stomach and colon. The stomach appears unremarkable for its degree of distension. No evidence of bowel wall thickening, distention or surrounding inflammatory change. The appendix is not clearly seen and may be surgically absent. There is distal colonic diverticulosis without evidence of acute inflammation. Vascular/Lymphatic: There are no enlarged abdominal or pelvic lymph nodes. Extensive aortic and branch vessel atherosclerosis without evidence of aneurysm. Reproductive: Status post hysterectomy. No evidence of adnexal mass. Other: Generalize subcutaneous edema with scattered subcutaneous calcifications in both buttocks attributed to prior injections. Relatively mild edema within the intra-abdominal fat. Trace ascites. No pneumoperitoneum. Musculoskeletal: No acute or significant osseous findings. Thoracic spinal stimulator, previous L4-5 PLIF and multilevel spondylosis are noted. Unless specific follow-up recommendations are mentioned in the findings or impression  sections, no imaging follow-up of any mentioned incidental findings is recommended. IMPRESSION: 1. Anasarca with moderate bilateral pleural effusions, mild ascites and generalized subcutaneous edema. 2. Patchy ground-glass opacities within the aerated lungs, possibly due to inflammation or edema. 3. No acute abdominopelvic findings identified. 4. Distal colonic diverticulosis without evidence of acute inflammation. 5. Foley catheter in place with resulting decompression of the bladder. Difficult to exclude mild underlying bladder wall thickening. 6.  Aortic Atherosclerosis (ICD10-I70.0). Electronically Signed   By: Carey Bullocks M.D.   On: 05/30/2023 11:27   DG CHEST PORT 1 VIEW  Result Date: 05/30/2023 CLINICAL DATA:  Acute renal failure.  Cough. EXAM: PORTABLE CHEST 1 VIEW COMPARISON:  05/26/2023 FINDINGS: Interval placement of a right chest wall dialysis catheter with tips in the right atrium. No pneumothorax identified. Stable cardiomediastinal contours. Dorsal column stimulator is noted in the midthoracic canal. Interval increase in bilateral pleural effusions and interstitial edema compared with the previous exam. Atelectasis and volume loss noted within both lower lobes. IMPRESSION: 1.  Interval placement of a right chest wall dialysis catheter with tips in the right atrium. No pneumothorax identified. 2. Interval increase in bilateral pleural effusions and interstitial edema compared with the previous exam. Electronically Signed   By: Signa Kell M.D.   On: 05/30/2023 10:16     Assessment/Plan:   Principal Problem:   Acute renal failure (ARF) (HCC) Active Problems:   Anxiety   Anemia, iron deficiency   Tobacco use disorder   Acute pulmonary edema (HCC)   Bilateral pleural effusion  AKI with oliguria Ischemic ATN Acute hypoxemic respiratory failure Pulmonary edema Bilateral pleural effusions Hyperkalemia Continued to produce low volume of urine. S/p HD yesterday. Given persistent   dyspnea, CXR was obtained with worsening pulmonary edema and bilateral pleural effusions likely in the setting of large volume fluid resuscitation. Cr and GFR continues to improve. However, discussed with nephrology who will plan for HD later today given worsening respiratory status. -HD today -Monitor I/Os -Supplemental O2 -O2 paremeters  -Avoid nephrotoxins -Lokelma daily per nephrology  Normocytic anemia Hx iron deficiency anemia S/p 1unit pRBC and Hgb stable this AM. FOBT negative. CTA abd and pelvis without concerns for obstruction/mass. RI 0.57 - hypo proliferative bone marrow. Elevated LDH but no signs of hemolysis with no schistocytes on cbc with diff and normal bilirubin. Could be elevated in the setting of new onset renal disease vs IDA with prolonged blood draws and poor nutritional status. Will continue to monitor and assess for signs or symptoms of malignancy.  Leukocytosis Stable. Likely in setting of acute illness. No new infectious symptoms of fever during this admission. -CTM  HTN Stable -Holding home meds in setting of low normal  Chronic back pain -Acetaminophen scheduled -Dilaudid 1mg  PRN  Abdominal pain, resolved Nausea, resolved Constipation, resolving -Bowel regimen with Miralax 17g daily and Senna S 2 tablets BID  Diet: Renal VTE: Heparin Code: DNR PT/OT recs: SNF for Subacute PT,  Dispo: Anticipated discharge to Skilled nursing facility in 2 days pending clinical improvement.   Morene Crocker, MD Internal Medicine Resident PGY-1 Please contact the on call pager after 5 pm and on weekends at [phone].

## 2023-06-01 DIAGNOSIS — J811 Chronic pulmonary edema: Secondary | ICD-10-CM | POA: Diagnosis not present

## 2023-06-01 DIAGNOSIS — J90 Pleural effusion, not elsewhere classified: Secondary | ICD-10-CM | POA: Diagnosis not present

## 2023-06-01 DIAGNOSIS — N170 Acute kidney failure with tubular necrosis: Secondary | ICD-10-CM | POA: Diagnosis not present

## 2023-06-01 DIAGNOSIS — J9601 Acute respiratory failure with hypoxia: Secondary | ICD-10-CM | POA: Diagnosis not present

## 2023-06-01 LAB — RENAL FUNCTION PANEL
Albumin: 2.3 g/dL — ABNORMAL LOW (ref 3.5–5.0)
Anion gap: 11 (ref 5–15)
BUN: 21 mg/dL (ref 8–23)
CO2: 26 mmol/L (ref 22–32)
Calcium: 7.7 mg/dL — ABNORMAL LOW (ref 8.9–10.3)
Chloride: 96 mmol/L — ABNORMAL LOW (ref 98–111)
Creatinine, Ser: 3.95 mg/dL — ABNORMAL HIGH (ref 0.44–1.00)
GFR, Estimated: 11 mL/min — ABNORMAL LOW (ref >60)
Glucose, Bld: 87 mg/dL (ref 70–99)
Phosphorus: 4 mg/dL (ref 2.5–4.6)
Potassium: 4.3 mmol/L (ref 3.5–5.1)
Sodium: 133 mmol/L — ABNORMAL LOW (ref 135–145)

## 2023-06-01 LAB — HEMOGLOBIN AND HEMATOCRIT, BLOOD
HCT: 25.2 % — ABNORMAL LOW (ref 36.0–46.0)
Hemoglobin: 8.1 g/dL — ABNORMAL LOW (ref 12.0–15.0)

## 2023-06-01 LAB — HAPTOGLOBIN: Haptoglobin: 44 mg/dL (ref 42–346)

## 2023-06-01 NOTE — Plan of Care (Signed)

## 2023-06-01 NOTE — Progress Notes (Signed)
Received patient in bed to unit.  Alert and oriented.  Informed consent signed and in chart.   TX duration:3hours.  Patient tolerated well.  Transported back to the room  Alert, without acute distress.  Hand-off given to patient's nurse.   Access used: catheter Access issues: none  Total UF removed: 2000 mls Medication(s) given: none Post HD VS: 177/96 Post HD weight: 59.8kg     06/01/23 0057  Vitals  Temp 98 F (36.7 C)  Temp Source Axillary  BP (!) 177/96  MAP (mmHg) 100  BP Location Left Arm  BP Method Automatic  Patient Position (if appropriate) Lying  Pulse Rate 74  Pulse Rate Source Monitor  ECG Heart Rate 76  Resp 15  Oxygen Therapy  SpO2 96 %  O2 Device Nasal Cannula  O2 Flow Rate (L/min) 1 L/min  Patient Activity (if Appropriate) In bed  Pulse Oximetry Type Continuous  During Treatment Monitoring  Intra-Hemodialysis Comments Tx completed;Tolerated well  Post Treatment  Dialyzer Clearance Lightly streaked  Duration of HD Treatment -hour(s) 3 hour(s)  Hemodialysis Intake (mL) 0 mL  Liters Processed 54  Fluid Removed (mL) 2000 mL  Tolerated HD Treatment Yes  Post-Hemodialysis Comments HD tx completed as expected, tolerated well, pt is stable  AVG/AVF Arterial Site Held (minutes) 0 minutes  AVG/AVF Venous Site Held (minutes) 0 minutes  Note  Observations pt is in bed stable.  Hemodialysis Catheter Right Internal jugular Double lumen Permanent (Tunneled)  Placement Date/Time: 05/28/23 1146   Serial / Lot #: [id number]  Expiration Date: 08/31/27  Maximum sterile barrier precautions: Hand hygiene;Cap;Mask;Sterile gown;Sterile gloves;Large sterile sheet  Site Prep: Chlorhexidine (preferred)  Local Anesthe...  Site Condition No complications  Blue Lumen Status Flushed;Heparin locked;Dead end cap in place  Red Lumen Status Flushed;Heparin locked;Dead end cap in place  Purple Lumen Status N/A  Catheter fill solution Heparin 1000 units/ml  Catheter fill  volume (Arterial) 1.6 cc  Catheter fill volume (Venous) 1.6  Dressing Type Transparent  Dressing Status Antimicrobial disc in place  Drainage Description None  Post treatment catheter status Capped and Clamped

## 2023-06-01 NOTE — Progress Notes (Signed)
Patient ID: YANESSA THREATS, female   DOB: 04-30-1946, 77 y.o.   MRN: [id number] S: Feels tired and finished HD early this am. O:BP (!) 129/56 (BP Location: Left Arm)   Pulse 68   Temp 97.9 F (36.6 C) (Oral)   Resp 18   Ht 5\' 2"  (1.575 m)   Wt 59.8 kg   LMP  (LMP Unknown)   SpO2 97%   BMI 24.11 kg/m   Intake/Output Summary (Last 24 hours) at 06/01/2023 1031 Last data filed at 06/01/2023 0829 Gross per 24 hour  Intake 297 ml  Output 2100 ml  Net -1803 ml   Intake/Output: I/O last 3 completed shifts: In: 836 [P.O.:774; IV Piggyback:62] Out: 2205 [Urine:205; Other:2000]  Intake/Output this shift:  Total I/O In: 120 [P.O.:120] Out: -  Weight change: 4 kg Gen: NAD CVS: RRR Resp:CTA Abd:+BS, soft, NT/nD Ext: no edema  Recent Labs  Lab 05/25/23 2319 05/26/23 0003 05/28/23 0026 05/28/23 1841 05/29/23 0026 05/30/23 0043 05/31/23 0036 05/31/23 2200 06/01/23 0638  NA 131*   < > 126* 126* 129* 130* 130* 134* 133*  K 4.5   < > 4.9 5.1 4.3 4.5 4.6 4.2 4.3  CL 94*   < > 91* 85* 92* 91* 94* 93* 96*  CO2 17*   < > 22 25 24 26 26 26 26   GLUCOSE 98   < > 91 99 86 92 106* 113* 87  BUN 96*   < > 81* 85* 53* 57* 36* 39* 21  CREATININE 7.93*   < > 8.00* 8.28* 5.65* 6.62* 5.20* 5.48* 3.95*  ALBUMIN 3.0*   < > 2.1* 2.1* 2.2* 2.2* 2.3*  2.3* 2.2* 2.3*  CALCIUM 8.4*   < > 7.3* 7.1* 7.2* 7.3* 7.5* 7.5* 7.7*  PHOS  --    < > 5.4* 6.5* 4.6 6.6* 5.1* 4.7* 4.0  AST 30  --   --   --   --   --  18  --   --   ALT 19  --   --   --   --   --  7  --   --    < > = values in this interval not displayed.   Liver Function Tests: Recent Labs  Lab 05/25/23 2319 05/27/23 0030 05/31/23 0036 05/31/23 2200 06/01/23 0638  AST 30  --  18  --   --   ALT 19  --  7  --   --   ALKPHOS 67  --  66  --   --   BILITOT 0.5  --  0.3  --   --   PROT 6.3*  --  5.1*  --   --   ALBUMIN 3.0*   < > 2.3*  2.3* 2.2* 2.3*   < > = values in this interval not displayed.   Recent Labs  Lab 05/25/23 2319  LIPASE  44   No results for input(s): "AMMONIA" in the last 168 hours. CBC: Recent Labs  Lab 05/25/23 2319 05/26/23 0003 05/30/23 0043 05/30/23 1121 05/30/23 1851 05/31/23 0036 05/31/23 2200 06/01/23 0638  WBC 12.1*   < > 9.7 11.0* 10.9* 11.1* 12.3*  --   NEUTROABS 10.3*  --   --   --   --  9.2*  --   --   HGB 9.6*   < > 6.5* 8.3* 8.3* 7.7* 7.5* 8.1*  HCT 29.7*   < > 20.1* 24.9* 24.9* 23.1* 23.4* 25.2*  MCV 84.6   < >  84.1 84.1 83.6 83.1 84.5  --   PLT 153   < > 241 254 260 289 306  --    < > = values in this interval not displayed.   Cardiac Enzymes: No results for input(s): "CKTOTAL", "CKMB", "CKMBINDEX", "TROPONINI" in the last 168 hours. CBG: No results for input(s): "GLUCAP" in the last 168 hours.  Iron Studies:  Recent Labs    05/31/23 0036  FERRITIN 543*   Studies/Results: CT ABDOMEN PELVIS WO CONTRAST  Result Date: 05/30/2023 CLINICAL DATA:  Abdominal pain, acute, nonlocalized. Acute renal failure. EXAM: CT ABDOMEN AND PELVIS WITHOUT CONTRAST TECHNIQUE: Multidetector CT imaging of the abdomen and pelvis was performed following the standard protocol without IV contrast. RADIATION DOSE REDUCTION: This exam was performed according to the departmental dose-optimization program which includes automated exposure control, adjustment of the mA and/or kV according to patient size and/or use of iterative reconstruction technique. COMPARISON:  Abdominopelvic CT 05/26/2023 and 01/11/2023. FINDINGS: Lower chest: There are moderate bilateral pleural effusions with increased compressive atelectasis in both lower lobes. Patchy ground-glass opacities are present within the aerated lungs, possibly due to inflammation or edema. Evidence of underlying subpleural scarring in both lungs. Central venous catheter tip extends to the upper right atrium. There is diffuse aortic and coronary artery atherosclerosis. Hepatobiliary: The liver appears unremarkable as imaged in the noncontrast state. No evidence  of gallstones, gallbladder wall thickening or biliary dilatation. Pancreas: Mildly atrophied. No focal abnormality, ductal dilatation or surrounding inflammation identified. Spleen: Normal in size without focal abnormality. Adrenals/Urinary Tract: Both adrenal glands appear normal. No evidence of urinary tract calculus, suspicious renal lesion or hydronephrosis. Stable renal vascular calcifications bilaterally and mild cortical thinning. A Foley catheter is in place with resulting decompression of the bladder. Difficult to exclude mild underlying bladder wall thickening. Stomach/Bowel: Some enteric contrast material is present within the stomach and colon. The stomach appears unremarkable for its degree of distension. No evidence of bowel wall thickening, distention or surrounding inflammatory change. The appendix is not clearly seen and may be surgically absent. There is distal colonic diverticulosis without evidence of acute inflammation. Vascular/Lymphatic: There are no enlarged abdominal or pelvic lymph nodes. Extensive aortic and branch vessel atherosclerosis without evidence of aneurysm. Reproductive: Status post hysterectomy. No evidence of adnexal mass. Other: Generalize subcutaneous edema with scattered subcutaneous calcifications in both buttocks attributed to prior injections. Relatively mild edema within the intra-abdominal fat. Trace ascites. No pneumoperitoneum. Musculoskeletal: No acute or significant osseous findings. Thoracic spinal stimulator, previous L4-5 PLIF and multilevel spondylosis are noted. Unless specific follow-up recommendations are mentioned in the findings or impression sections, no imaging follow-up of any mentioned incidental findings is recommended. IMPRESSION: 1. Anasarca with moderate bilateral pleural effusions, mild ascites and generalized subcutaneous edema. 2. Patchy ground-glass opacities within the aerated lungs, possibly due to inflammation or edema. 3. No acute  abdominopelvic findings identified. 4. Distal colonic diverticulosis without evidence of acute inflammation. 5. Foley catheter in place with resulting decompression of the bladder. Difficult to exclude mild underlying bladder wall thickening. 6.  Aortic Atherosclerosis (ICD10-I70.0). Electronically Signed   By: Carey Bullocks M.D.   On: 05/30/2023 11:27    acetaminophen  1,000 mg Oral Q8H   albuterol  2.5 mg Nebulization BID   Chlorhexidine Gluconate Cloth  6 each Topical Q0600   Chlorhexidine Gluconate Cloth  6 each Topical Q0600   heparin injection (subcutaneous)  5,000 Units Subcutaneous Q8H   heparin sodium (porcine)  3.2 mL Intravenous Once   hydrocortisone  cream   Topical BID   hydrOXYzine  25 mg Oral BID   levothyroxine  75 mcg Oral Q0600   lidocaine  1 patch Transdermal Q24H   lidocaine  1 Application Urethral Daily   lidocaine-EPINEPHrine  10 mL Infiltration Once   melatonin  3 mg Oral QHS   pantoprazole  40 mg Oral Daily   polyethylene glycol  17 g Oral Daily   senna-docusate  2 tablet Oral BID   sodium zirconium cyclosilicate  10 g Oral Daily    BMET    Component Value Date/Time   NA 133 (L) 06/01/2023 0638   K 4.3 06/01/2023 0638   CL 96 (L) 06/01/2023 0638   CO2 26 06/01/2023 0638   GLUCOSE 87 06/01/2023 0638   BUN 21 06/01/2023 0638   CREATININE 3.95 (H) 06/01/2023 0638   CALCIUM 7.7 (L) 06/01/2023 0638   GFRNONAA 11 (L) 06/01/2023 0638   GFRAA >60 10/10/2016 0556   CBC    Component Value Date/Time   WBC 12.3 (H) 05/31/2023 2200   RBC 2.77 (L) 05/31/2023 2200   HGB 8.1 (L) 06/01/2023 0638   HCT 25.2 (L) 06/01/2023 0638   PLT 306 05/31/2023 2200   MCV 84.5 05/31/2023 2200   MCH 27.1 05/31/2023 2200   MCHC 32.1 05/31/2023 2200   RDW 17.8 (H) 05/31/2023 2200   LYMPHSABS 0.8 05/31/2023 0036   MONOABS 0.4 05/31/2023 0036   EOSABS 0.5 05/31/2023 0036   BASOSABS 0.0 05/31/2023 0036     Assessment/Plan:  AKI, oliguric - Scr was normal in May.  By history  this is most consistent with ischemic ATN in setting of N/V for the past week with concomitant ARB therapy. Unfortunately no urine to evaluate.  No evidence of cortical necrosis on renal US, no NSAID use, time course too short for AIN, no obstruction.  Negative ANA, dsDNA, ASO, ANCA, and normal complements.  She had TDC placed 05/28/23 by IR, s/p first HD session 05/29/23 and second on 05/30/23.  Some UOP overnight and improving po intake.  Notified of CXR with pleural effusions and pulmonary edema and IVF's stopped and had 3rd HD early this morning and UF of 2 liters.  Breathing better.  Will hold off on further HD for now and follow for renal recovery.  Avoid nephrotoxic medications including NSAIDs and iodinated intravenous contrast exposure unless the latter is absolutely indicated.  Preferred narcotic agents for pain control are hydromorphone, fentanyl, and methadone. Morphine should not be used. Avoid Baclofen and avoid oral sodium phosphate and magnesium citrate based laxatives / bowel preps. Continue strict Input and Output monitoring. Will monitor the patient closely with you and intervene or adjust therapy as indicated by changes in clinical status/labs  AGMA - due to #1.  Switched to isotonic bicarb with improvement of Co2 to 26.  Now off of bicarb.   Hyperkalemia - given lokelma 5 mg x 1, will stop LR and change to isotonic bicarb.  Also increase dose of Lokelma to 10 grams but decrease to daily since she had HD.  Hyponatremia - again likely hypovolemic hyponatremia by history.  Unable to obtain urine sample at this time and will continue to follow.   cortisol level 15.7. Elevated troponin - with vague history of chest pain.  Per primary svc. HTN- low bp, losartan on hold due to AKI. Low back pain - chronic Abdominal pain and persistent nausea - CT of abd/pelvis without source of symptoms. Recommend GI consult.  COPD - per primary  svc Normocytic anemia - TSAT 21%, will dose with IV iron and follow.   Transfused 1 unit PRBC 05/30/23 and Hgb 7.7 today.  Irena Cords, MD [facility]

## 2023-06-01 NOTE — Progress Notes (Signed)
Contacted Dr. Augustin Coupe to clarify need for foley catheter due to patient being oliguric and not urinary retention. MD states to leave catheter in place and entered order to "Continue foley catheter".

## 2023-06-01 NOTE — Progress Notes (Signed)
Mobility Specialist Progress Note   06/01/23 1513  Mobility  Activity Ambulated with assistance in room  Level of Assistance Minimal assist, patient does 75% or more  Assistive Device Front wheel walker  Distance Ambulated (ft) 28 ft (14+14)  Activity Response Tolerated well  Mobility Referral Yes  $Mobility charge 1 Mobility  Mobility Specialist Start Time (ACUTE ONLY) 1440  Mobility Specialist Stop Time (ACUTE ONLY) 1510  Mobility Specialist Time Calculation (min) (ACUTE ONLY) 30 min   Pre Mobility: 99% SpO2 on 2LO2 During Mobility: 86% - 94% SpO2 on RA - 2LO2 Post Mobility: 100% SpO2 on 2LO2  Received pt in bed c/o hurting "everywhere" and disinclined to mobility but w/ max encouragement pt agreeable. Pt presenting today w/ general weakness but only requiring increased time + minA throughout session. Able to ambulate in room for a short bout w/o fault but having exaggerated steps in gait. Initially ambulated on RA but SpO2 dropping to 86% and pt requiring 2L of intermittent O2 half way through session. Returned back to bed w/ minA d/t expressed R knee pain. Placed call bell in reach and turned bed alarm on. Pt left on 2LO2 per advisement of RN.      Frederico Hamman Mobility Specialist Please contact via SecureChat or  Rehab office at [phone]

## 2023-06-01 NOTE — Progress Notes (Signed)
Hospital day#6 Subjective:   Summary: Samantha Fletcher with a pertinent past medical history of HTN, COPD, paroxysmal atrial fibrillation, chronic pain, recurrent UTIs on hospital day 5 after being admitted for AKI likely secondary to ischemic ATN with oliguria s/p large volume of IVF therapy c/b pulmonary edema and pleural effusions currently undergoing HD  Overnight Events: HD session overnight Patient feeling better today after completing last session of hemodialysis yesterday; specifically, patient says that her breathing has improved, has been able to get up from bed and move around.  Overall pain and mood has been better.  Encouraged by a little bit more urine in her Foley.  Tolerating p.o. intake without significant nausea.  Has been able to sleep bladder in bed after HD. denies chest pain, palpitations, headaches, abdominal discomfort, or suprapubic tenderness.   Objective:  Vital signs in last 24 hours: Vitals:   06/01/23 0405 06/01/23 0507 06/01/23 0810 06/01/23 1150  BP: (!) 154/73  (!) 129/56 (!) 120/107  Pulse: 71  68 71  Resp: 20  18 19   Temp: 98.5 F (36.9 C)  97.9 F (36.6 C) 97.9 F (36.6 C)  TempSrc: Oral  Oral Oral  SpO2: 100%  97% 99%  Weight: 60.9 kg 59.8 kg    Height:       Supplemental O2: Nasal Cannula SpO2: 99 % O2 Flow Rate (L/min): 7 L/min FiO2 (%): 28 % Filed Weights   06/01/23 0106 06/01/23 0405 06/01/23 0507  Weight: 59.8 kg 60.9 kg 59.8 kg    Physical Exam:  Constitutional: Laying in bed comfortably No significant JVP elevation Regular rate and rhythm without murmurs Speaking full sentences with normal work of breathing on room air.  Clear to auscultation admitted to upper bilateral lung fields with mild crackles and bibasilar lungs Normal BS, soft, non-tender, non-distended.  No lower extremity edema today Alert and oriented x 3, conversing appropriately with insight about her current medical status Skin: warm and dry Pleasant mood and  affect however    Intake/Output Summary (Last 24 hours) at 06/01/2023 1430 Last data filed at 06/01/2023 1304 Gross per 24 hour  Intake 240 ml  Output 2100 ml  Net -1860 ml   Net IO Since Admission: 2,861.69 mL [06/01/23 1430]  Pertinent Labs:    Latest Ref Rng & Units 06/01/2023    6:38 AM 05/31/2023   10:00 PM 05/31/2023   12:36 AM  CBC  WBC 4.0 - 10.5 K/uL  12.3  11.1   Hemoglobin 12.0 - 15.0 g/dL 8.1  7.5  7.7   Hematocrit 36.0 - 46.0 % 25.2  23.4  23.1   Platelets 150 - 400 K/uL  306  289        Latest Ref Rng & Units 06/01/2023    6:38 AM 05/31/2023   10:00 PM 05/31/2023   12:36 AM  CMP  Glucose 70 - 99 mg/dL 87  562  130   BUN 8 - 23 mg/dL 21  39  36   Creatinine 0.44 - 1.00 mg/dL 8.65  7.84  6.96   Sodium 135 - 145 mmol/L 133  134  130   Potassium 3.5 - 5.1 mmol/L 4.3  4.2  4.6   Chloride 98 - 111 mmol/L 96  93  94   CO2 22 - 32 mmol/L 26  26  26    Calcium 8.9 - 10.3 mg/dL 7.7  7.5  7.5   Total Protein 6.5 - 8.1 g/dL   5.1   Total  Bilirubin 0.3 - 1.2 mg/dL   0.3   Alkaline Phos 38 - 126 U/L   66   AST 15 - 41 U/L   18   ALT 0 - 44 U/L   7     Imaging: No results found.   Assessment/Plan:   Principal Problem:   Acute renal failure (ARF) (HCC) Active Problems:   Anxiety   Anemia, iron deficiency   Tobacco use disorder   Acute pulmonary edema (HCC)   Bilateral pleural effusion  AKI with oliguria Ischemic ATN Acute hypoxemic respiratory failure, improved Pulmonary edema Bilateral pleural effusions Status post third HD sessions through the tunneled HD catheter for worsening pulmonary edema and respiratory distress now with improvement. No plans for further hemodialysis today.  Will continue to monitor vital signs, specifically respiratory status, electrolytes , and signs of renal recovery -Monitor I/Os -Supplemental O2 -O2 paremeters  -Avoid nephrotoxins -Lokelma daily per nephrology  Normocytic anemia Hx iron deficiency anemia Stable hemoglobin today  at 8.1.  No overt signs of bleeding per mouth or rectum.  -Minimize blood draws -Monitor for signs of bleeding -Continue to monitor  Leukocytosis Stable. Likely in setting of acute illness. No new infectious symptoms of fever during this admission. -CTM  HTN Intermittently hypertensive today. -Holding home meds in setting of low normal -If persistently hypertensive may start patient on amlodipine  Chronic back pain -Acetaminophen scheduled -Dilaudid 1mg  PRN  Diet: Renal VTE: Heparin Code: DNR PT/OT recs: SNF for Subacute PT,  Dispo: Anticipated discharge to Skilled nursing facility in 2 days pending clinical improvement.   Morene Crocker, MD Internal Medicine Resident PGY-2 Please contact the on call pager after 5 pm and on weekends at [phone].

## 2023-06-02 ENCOUNTER — Inpatient Hospital Stay (HOSPITAL_COMMUNITY): Payer: Medicare HMO

## 2023-06-02 DIAGNOSIS — E039 Hypothyroidism, unspecified: Secondary | ICD-10-CM | POA: Diagnosis not present

## 2023-06-02 DIAGNOSIS — J449 Chronic obstructive pulmonary disease, unspecified: Secondary | ICD-10-CM | POA: Diagnosis not present

## 2023-06-02 DIAGNOSIS — N170 Acute kidney failure with tubular necrosis: Secondary | ICD-10-CM | POA: Diagnosis not present

## 2023-06-02 DIAGNOSIS — D72829 Elevated white blood cell count, unspecified: Secondary | ICD-10-CM

## 2023-06-02 DIAGNOSIS — J90 Pleural effusion, not elsewhere classified: Secondary | ICD-10-CM

## 2023-06-02 DIAGNOSIS — I10 Essential (primary) hypertension: Secondary | ICD-10-CM | POA: Diagnosis not present

## 2023-06-02 LAB — CBC
HCT: 24.8 % — ABNORMAL LOW (ref 36.0–46.0)
Hemoglobin: 8.1 g/dL — ABNORMAL LOW (ref 12.0–15.0)
MCH: 27.7 pg (ref 26.0–34.0)
MCHC: 32.7 g/dL (ref 30.0–36.0)
MCV: 84.9 fL (ref 80.0–100.0)
Platelets: 357 10*3/uL (ref 150–400)
RBC: 2.92 MIL/uL — ABNORMAL LOW (ref 3.87–5.11)
RDW: 17.4 % — ABNORMAL HIGH (ref 11.5–15.5)
WBC: 10.1 10*3/uL (ref 4.0–10.5)
nRBC: 0 % (ref 0.0–0.2)

## 2023-06-02 LAB — RENAL FUNCTION PANEL
Albumin: 2.1 g/dL — ABNORMAL LOW (ref 3.5–5.0)
Anion gap: 11 (ref 5–15)
BUN: 30 mg/dL — ABNORMAL HIGH (ref 8–23)
CO2: 26 mmol/L (ref 22–32)
Calcium: 7.6 mg/dL — ABNORMAL LOW (ref 8.9–10.3)
Chloride: 96 mmol/L — ABNORMAL LOW (ref 98–111)
Creatinine, Ser: 4.75 mg/dL — ABNORMAL HIGH (ref 0.44–1.00)
GFR, Estimated: 9 mL/min — ABNORMAL LOW (ref >60)
Glucose, Bld: 105 mg/dL — ABNORMAL HIGH (ref 70–99)
Phosphorus: 5.2 mg/dL — ABNORMAL HIGH (ref 2.5–4.6)
Potassium: 4.2 mmol/L (ref 3.5–5.1)
Sodium: 133 mmol/L — ABNORMAL LOW (ref 135–145)

## 2023-06-02 MED ORDER — HEPARIN SODIUM (PORCINE) 1000 UNIT/ML IJ SOLN
INTRAMUSCULAR | Status: AC
  Filled 2023-06-02: qty 4

## 2023-06-02 MED ORDER — AMLODIPINE BESYLATE 5 MG PO TABS
5.0000 mg | ORAL_TABLET | Freq: Every day | ORAL | Status: DC
  Administered 2023-06-02 – 2023-06-03 (×2): 5 mg via ORAL
  Filled 2023-06-02 (×2): qty 1

## 2023-06-02 MED ORDER — FUROSEMIDE 10 MG/ML IJ SOLN
100.0000 mg | Freq: Once | INTRAVENOUS | Status: DC
  Filled 2023-06-02: qty 10

## 2023-06-02 MED ORDER — HEPARIN SODIUM (PORCINE) 1000 UNIT/ML IJ SOLN
INTRAMUSCULAR | Status: AC
  Administered 2023-06-02: 3200 [IU]
  Filled 2023-06-02: qty 4

## 2023-06-02 MED ORDER — SODIUM CHLORIDE 0.9 % IV SOLN
3.0000 g | Freq: Every day | INTRAVENOUS | Status: DC
  Filled 2023-06-02: qty 8

## 2023-06-02 NOTE — Progress Notes (Signed)
IV team called this RN because patient refusing her PIV line insertion. Education provided regarding her antibiotics. But she strongly refused it. MD notified.

## 2023-06-02 NOTE — Progress Notes (Addendum)
Received patient in bed.Awake,alert and oriented x 4.Consent verified.  Access used : Right HD catheter that worked well. Dressing on date.  Medicine given : Heparin 3200 unit pre-run dose.  Duration of treatment : 2.5 hours.  Fluid removed 1 liter.  Hand off to the patient's nurse.

## 2023-06-02 NOTE — Progress Notes (Signed)
Patient ID: [name], female   DOB: 1946-04-23, 77 y.o.   MRN: [id number] S: Continue dyspnea and chest pain.  Cough present.  Didn't feel much better after HD yest. UOP 60mL yest.   O:BP (!) 155/61 (BP Location: Left Arm)   Pulse 68   Temp 98.5 F (36.9 C) (Oral)   Resp 16   Ht 5\' 2"  (1.575 m)   Wt 60.1 kg   LMP  (LMP Unknown)   SpO2 98%   BMI 24.23 kg/m   Intake/Output Summary (Last 24 hours) at 06/02/2023 0917 Last data filed at 06/01/2023 1837 Gross per 24 hour  Intake 120 ml  Output 60 ml  Net 60 ml    Intake/Output: I/O last 3 completed shifts: In: 240 [P.O.:240] Out: 2160 [Urine:160; Other:2000]  Intake/Output this shift:  No intake/output data recorded. Weight change: -1.6 kg Gen: NAD CVS: RRR Resp:dec BS bases, normal WOB at rest Abd:+BS, soft, NT/nD  Ext: no edema  Recent Labs  Lab 05/28/23 1841 05/29/23 0026 05/30/23 0043 05/31/23 0036 05/31/23 2200 06/01/23 0638 06/02/23 0100  NA 126* 129* 130* 130* 134* 133* 133*  K 5.1 4.3 4.5 4.6 4.2 4.3 4.2  CL 85* 92* 91* 94* 93* 96* 96*  CO2 25 24 26 26 26 26 26   GLUCOSE 99 86 92 106* 113* 87 105*  BUN 85* 53* 57* 36* 39* 21 30*  CREATININE 8.28* 5.65* 6.62* 5.20* 5.48* 3.95* 4.75*  ALBUMIN 2.1* 2.2* 2.2* 2.3*  2.3* 2.2* 2.3* 2.1*  CALCIUM 7.1* 7.2* 7.3* 7.5* 7.5* 7.7* 7.6*  PHOS 6.5* 4.6 6.6* 5.1* 4.7* 4.0 5.2*  AST  --   --   --  18  --   --   --   ALT  --   --   --  7  --   --   --     Liver Function Tests: Recent Labs  Lab 05/31/23 0036 05/31/23 2200 06/01/23 0638 06/02/23 0100  AST 18  --   --   --   ALT 7  --   --   --   ALKPHOS 66  --   --   --   BILITOT 0.3  --   --   --   PROT 5.1*  --   --   --   ALBUMIN 2.3*  2.3* 2.2* 2.3* 2.1*    No results for input(s): "LIPASE", "AMYLASE" in the last 168 hours.  No results for input(s): "AMMONIA" in the last 168 hours. CBC: Recent Labs  Lab 05/30/23 1121 05/30/23 1851 05/31/23 0036 05/31/23 2200 06/01/23 0638 06/02/23 0100  WBC  11.0* 10.9* 11.1* 12.3*  --  10.1  NEUTROABS  --   --  9.2*  --   --   --   HGB 8.3* 8.3* 7.7* 7.5* 8.1* 8.1*  HCT 24.9* 24.9* 23.1* 23.4* 25.2* 24.8*  MCV 84.1 83.6 83.1 84.5  --  84.9  PLT 254 260 289 306  --  357    Cardiac Enzymes: No results for input(s): "CKTOTAL", "CKMB", "CKMBINDEX", "TROPONINI" in the last 168 hours. CBG: No results for input(s): "GLUCAP" in the last 168 hours.  Iron Studies:  Recent Labs    05/31/23 0036  FERRITIN 543*    Studies/Results: DG CHEST PORT 1 VIEW  Result Date: 06/02/2023 CLINICAL DATA:  10031 Cough 10031 EXAM: PORTABLE CHEST 1 VIEW COMPARISON:  CXR 05/30/23 FINDINGS: Redemonstrated small bilateral pleural effusions and superimposed hazy bibasilar airspace opacities, unchanged from prior exam.  Unchanged cardiac and mediastinal contours. No radiographically apparent new displaced rib fractures. Right-sided central venous catheter and spinal nerve stimulator leads in place. Visualized upper abdomen is unremarkable. IMPRESSION: Unchanged small bilateral pleural effusions and superimposed hazy bibasilar airspace opacities, which could represent atelectasis or infection. Electronically Signed   By: Lorenza Cambridge M.D.   On: 06/02/2023 08:59    acetaminophen  1,000 mg Oral Q8H   amLODipine  5 mg Oral Daily   Chlorhexidine Gluconate Cloth  6 each Topical Q0600   Chlorhexidine Gluconate Cloth  6 each Topical Q0600   heparin injection (subcutaneous)  5,000 Units Subcutaneous Q8H   heparin sodium (porcine)  3.2 mL Intravenous Once   hydrocortisone cream   Topical BID   hydrOXYzine  25 mg Oral BID   levothyroxine  75 mcg Oral Q0600   lidocaine  1 patch Transdermal Q24H   lidocaine  1 Application Urethral Daily   lidocaine-EPINEPHrine  10 mL Infiltration Once   melatonin  3 mg Oral QHS   pantoprazole  40 mg Oral Daily   polyethylene glycol  17 g Oral Daily   senna-docusate  2 tablet Oral BID   sodium zirconium cyclosilicate  10 g Oral Daily    BMET     Component Value Date/Time   NA 133 (L) 06/02/2023 0100   K 4.2 06/02/2023 0100   CL 96 (L) 06/02/2023 0100   CO2 26 06/02/2023 0100   GLUCOSE 105 (H) 06/02/2023 0100   BUN 30 (H) 06/02/2023 0100   CREATININE 4.75 (H) 06/02/2023 0100   CALCIUM 7.6 (L) 06/02/2023 0100   GFRNONAA 9 (L) 06/02/2023 0100   GFRAA >60 10/10/2016 0556   CBC    Component Value Date/Time   WBC 10.1 06/02/2023 0100   RBC 2.92 (L) 06/02/2023 0100   HGB 8.1 (L) 06/02/2023 0100   HCT 24.8 (L) 06/02/2023 0100   PLT 357 06/02/2023 0100   MCV 84.9 06/02/2023 0100   MCH 27.7 06/02/2023 0100   MCHC 32.7 06/02/2023 0100   RDW 17.4 (H) 06/02/2023 0100   LYMPHSABS 0.8 05/31/2023 0036   MONOABS 0.4 05/31/2023 0036   EOSABS 0.5 05/31/2023 0036   BASOSABS 0.0 05/31/2023 0036     Assessment/Plan:  AKI, oliguric - Scr was normal in May.  By history this is most consistent with ischemic ATN in setting of N/V for the past week with concomitant ARB therapy. Unfortunately no urine to evaluate.  No evidence of cortical necrosis on renal US, no NSAID use, time course too short for AIN, no obstruction.  Negative ANA, dsDNA, ASO, ANCA, and normal complements.  She had TDC placed 05/28/23 by IR, s/p first HD session 05/29/23, 2nd 7/5, 3rd 7/7.  UOP remains poor and labs showing no e/o recovery.  Ordered HD today given ongoing dyspnea, however if no improvement suspect it's more related to effusions or possible bronchitis.     Avoid nephrotoxic medications including NSAIDs and iodinated intravenous contrast exposure unless the latter is absolutely indicated.  Preferred narcotic agents for pain control are hydromorphone, fentanyl, and methadone. Morphine should not be used. Avoid Baclofen and avoid oral sodium phosphate and magnesium citrate based laxatives / bowel preps. Continue strict Input and Output monitoring. Will monitor the patient closely with you and intervene or adjust therapy as indicated by changes in clinical status/labs   Hyponatremia - suspected hypovolemic hyponatremia by history.  Improving. Elevated troponin - with vague history of chest pain.  Per primary svc. HTN- losartan on hold with  AKI, BP remains high and amlodipine was started today. Low back pain - chronic Abdominal pain and persistent nausea - CT of abd/pelvis without source of symptoms.  Per primary.  COPD - per primary svc; CXT today with persistent effusions and opacities ? Bronchitis.  Trying UF as above in case pulm edema contributing.  Normocytic anemia - TSAT 21%, dosed with IV iron and follow.  Transfused 1 unit PRBC 05/30/23 and Hgb stable 8.1 today.  Estill Bakes MD [facility] Pager [phone]

## 2023-06-02 NOTE — Progress Notes (Signed)
Hospital day#7 Subjective:   Summary: Samantha Fletcher with a pertinent past medical history of HTN, COPD, paroxysmal atrial fibrillation, chronic pain, recurrent UTIs on hospital day 7 after being admitted for AKI likely secondary to ischemic ATN with oliguria s/p large volume of IVF therapy c/b pulmonary edema and pleural effusions currently undergoing HD. Three HD sessions so far. Fourth HD session 7/08.  No overnight events. Congested cough with clear sputum. Reports cough is worse with drinking and eating. Receiving O2 at 2L via [place]. Reports some nausea, denies vomiting. Denies diarrhea; reports "tight" BMs with most recent BM yesterday or today. Patient expressed frustration this morning at similar questions on daily exam.   Objective:  Vital signs in last 24 hours: Vitals:   06/01/23 1926 06/02/23 0012 06/02/23 0458 06/02/23 0824  BP: 139/75 (!) 172/87 (!) 168/89 (!) 155/61  Pulse: 66 73 71 68  Resp: 18 19  16   Temp: 98.2 F (36.8 C) 98 F (36.7 C) 98.3 F (36.8 C) 98.5 F (36.9 C)  TempSrc: Oral Oral Oral Oral  SpO2: 97% 99% 99% 98%  Weight:   60.1 kg   Height:       Supplemental O2: Nasal Cannula SpO2: 98 % O2 Flow Rate (L/min): 2 L/min FiO2 (%): 28 % Filed Weights   06/01/23 0405 06/01/23 0507 06/02/23 0458  Weight: 60.9 kg 59.8 kg 60.1 kg    Physical Exam:  Constitutional: Patient is resting with head of bed elevated while eating breakfast. Coughing throughout morning interview and exam.  CV: Regular rate and rhythm without murmurs on auscultation. No LE edema.  Pulmonary/Respiratory: Upper bilateral lung fields with mild crackles, worsening crackles bilaterally up to mid-lung.  Abdominal: Normal BS, soft, non-tender, non-distended.  Neuro: Alert and oriented x 3, conversing appropriately  Skin: warm and dry Psych: Frustrated by questions, congruent affect.   Intake/Output Summary (Last 24 hours) at 06/02/2023 0949 Last data filed at 06/01/2023 1837 Gross per 24  hour  Intake 120 ml  Output 60 ml  Net 60 ml   Net IO Since Admission: 2,801.69 mL [06/02/23 0949]  Pertinent Labs:    Latest Ref Rng & Units 06/02/2023    1:00 AM 06/01/2023    6:38 AM 05/31/2023   10:00 PM  CBC  WBC 4.0 - 10.5 K/uL 10.1   12.3   Hemoglobin 12.0 - 15.0 g/dL 8.1  8.1  7.5   Hematocrit 36.0 - 46.0 % 24.8  25.2  23.4   Platelets 150 - 400 K/uL 357   306        Latest Ref Rng & Units 06/02/2023    1:00 AM 06/01/2023    6:38 AM 05/31/2023   10:00 PM  CMP  Glucose 70 - 99 mg/dL 161  87  096   BUN 8 - 23 mg/dL 30  21  39   Creatinine 0.44 - 1.00 mg/dL 0.45  4.09  8.11   Sodium 135 - 145 mmol/L 133  133  134   Potassium 3.5 - 5.1 mmol/L 4.2  4.3  4.2   Chloride 98 - 111 mmol/L 96  96  93   CO2 22 - 32 mmol/L 26  26  26    Calcium 8.9 - 10.3 mg/dL 7.6  7.7  7.5    Component Ref Range & Units 01:00 (06/02/23) 1 d ago (06/01/23) 2 d ago (05/31/23) 2 d ago (05/31/23) 2 d ago (05/31/23) 3 d ago (05/30/23) 4 d ago (05/29/23)  Sodium 135 -  145 mmol/L 133 Low  133 Low  134 Low   130 Low  130 Low  129 Low   Potassium 3.5 - 5.1 mmol/L 4.2 4.3 4.2  4.6 4.5 4.3  Chloride 98 - 111 mmol/L 96 Low  96 Low  93 Low   94 Low  91 Low  92 Low   CO2 22 - 32 mmol/L 26 26 26  26 26 24   Glucose, Bld 70 - 99 mg/dL 409 High  87 CM 811 High  CM  106 High  CM 92 CM 86 CM  Comment: Glucose reference range applies only to samples taken after fasting for at least 8 hours.  BUN 8 - 23 mg/dL 30 High  21 39 High   36 High  57 High  53 High   Creatinine, Ser 0.44 - 1.00 mg/dL 9.14 High  7.82 High  9.56 High   5.20 High  6.62 High  5.65 High   Calcium 8.9 - 10.3 mg/dL 7.6 Low  7.7 Low  7.5 Low   7.5 Low  7.3 Low  7.2 Low   Phosphorus 2.5 - 4.6 mg/dL 5.2 High  4.0 4.7 High   5.1 High  6.6 High  4.6  Albumin 3.5 - 5.0 g/dL 2.1 Low  2.3 Low  2.2 Low  2.3 Low  2.3 Low  2.2 Low  2.2 Low   GFR, Estimated >60 mL/min 9 Low  11 Low  CM 8 Low  CM  8 Low  CM 6 Low  CM 7 Low  CM  Comment: (NOTE) Calculated using  the CKD-EPI Creatinine Equation (2021)  Anion gap 5 - 15 11 11  CM 15 CM  10 CM 13 CM 13 CM    Imaging: DG CHEST PORT 1 VIEW  Result Date: 06/02/2023 CLINICAL DATA:  10031 Cough 10031 EXAM: PORTABLE CHEST 1 VIEW COMPARISON:  CXR 05/30/23 FINDINGS: Redemonstrated small bilateral pleural effusions and superimposed hazy bibasilar airspace opacities, unchanged from prior exam. Unchanged cardiac and mediastinal contours. No radiographically apparent new displaced rib fractures. Right-sided central venous catheter and spinal nerve stimulator leads in place. Visualized upper abdomen is unremarkable. IMPRESSION: Unchanged small bilateral pleural effusions and superimposed hazy bibasilar airspace opacities, which could represent atelectasis or infection. Electronically Signed   By: Lorenza Cambridge M.D.   On: 06/02/2023 08:59     Assessment/Plan:   Principal Problem:   Acute renal failure (ARF) (HCC) Active Problems:   Anxiety   Anemia, iron deficiency   Tobacco use disorder   Acute pulmonary edema (HCC)   Bilateral pleural effusion  AKI with oliguria Ischemic ATN Acute hypoxemic respiratory failure, improved Pulmonary edema Bilateral pleural effusions Status post third HD sessions through the tunneled HD catheter for worsening pulmonary edema and respiratory distress now with worsening cough. CXR 7/08 with bilateral pleural effusions and bibasilar airspace opacities, relatively similar to CXR 7/05. WBC WNL 10.1, remains afebrile. Low threshold for aspiration pneumonia. On aspiration precautions but eating and drinking unmodified diet. Hemodialysis planned for today (7/08). Will continue to monitor vital signs, specifically respiratory status, electrolytes, and signs of renal recovery. Nephrology following, continue to appreciate recommendations.  -Monitor I/Os -Supplemental O2 -O2 paremeters  -Avoid nephrotoxins -Lokelma daily per nephrology -SLP consult for aspiration/modified diet  need  Normocytic anemia Hx iron deficiency anemia Stable hemoglobin today at 8.1.  No overt signs of bleeding per mouth or rectum.  -Minimize blood draws -Monitor for signs of bleeding -Continue to monitor  Leukocytosis WBC WNL today, remains afebrile. WBC  going up and down, with elevations to WBC likely in setting of acute illness. No new infectious symptoms of fever during this admission. Increased sputum and worsening cough with low threshold for aspiration pneumonia.  -Continue to monitor  HTN Intermittently hypertensive today. Last three blood pressures 172/87, 168/69, 155/61 (most recent). Holding home meds in setting of low normal, but given rising blood pressures will slowly restart home medications and continue to monitor vitals.  Plan:  - Restart amlodipine 5 mg daily   Chronic back pain -Acetaminophen scheduled -Dilaudid 1mg  PRN  Diet: Renal VTE: Heparin Code: DNR PT/OT recs: SNF for Subacute PT,  Dispo: Anticipated discharge to Skilled nursing facility in 1 day pending clinical improvement.   Mukhtar Shams Colbert Coyer, MD Internal Medicine Resident PGY-1 Please contact the on call pager after 5 pm and on weekends at [phone].

## 2023-06-02 NOTE — Progress Notes (Signed)
SLP Cancellation Note  Patient Details Name: Samantha Fletcher MRN: [id number] DOB: September 29, 1946   Cancelled treatment:        Pt refuses swallowing assessment at this time.  Pt states her swallowing is fine.  SLP provided education on risk of pneumonia with aspiration and pt continues to refuse evaluation.  SLP will reattempt as schedule permits when pt is agreeable to clinical swallowing assessment.    Kerrie Pleasure, MA, CCC-SLP Acute Rehabilitation Services Office: [phone] 06/02/2023, 11:39 AM

## 2023-06-02 NOTE — Progress Notes (Signed)
PT Cancellation Note  Patient Details Name: Samantha Fletcher MRN: [id number] DOB: 1946/07/19   Cancelled Treatment:    Reason Eval/Treat Not Completed: Patient declined, no reason specified - pt refused mobility, states she needs to rest given feeling short of breath. SPO2 97%, RR 21. Pt adamantly refuses PT today, will check back tomorrow.  Marye Round, PT DPT Acute Rehabilitation Services Secure Chat Preferred  Office [phone]    Keoni Havey Sheliah Plane 06/02/2023, 10:05 AM

## 2023-06-02 NOTE — Progress Notes (Signed)
Pharmacy Antibiotic Note  Samantha Fletcher is a 77 y.o. female admitted on 05/25/2023 with  asp pna .  Pharmacy has been consulted for Unasyn dosing.  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    Allergies  Allergen Reactions   Celecoxib Other (See Comments)    Mouth sores   Gabapentin Other (See Comments)    Confused, psychotic    Nabumetone Other (See Comments)    Mouth sores   Naproxen Other (See Comments)    Dropped WBC count   Tramadol Other (See Comments)    Mouth sores   Lyrica [Pregabalin] Other (See Comments)    confusion   Macrobid [Nitrofurantoin]     Vomiting   Buspar [Buspirone] Anxiety    Antimicrobials this admission: 7/8 Unasyn >>   Microbiology results: Pending  Thank you for allowing pharmacy to be a part of this patient's care.  Christoper Fabian, PharmD, BCPS Please see amion for complete clinical pharmacist phone list 06/02/2023 8:52 PM

## 2023-06-03 DIAGNOSIS — J449 Chronic obstructive pulmonary disease, unspecified: Secondary | ICD-10-CM | POA: Diagnosis not present

## 2023-06-03 DIAGNOSIS — E039 Hypothyroidism, unspecified: Secondary | ICD-10-CM | POA: Diagnosis not present

## 2023-06-03 DIAGNOSIS — I10 Essential (primary) hypertension: Secondary | ICD-10-CM | POA: Diagnosis not present

## 2023-06-03 DIAGNOSIS — N170 Acute kidney failure with tubular necrosis: Secondary | ICD-10-CM | POA: Diagnosis not present

## 2023-06-03 LAB — RENAL FUNCTION PANEL
Albumin: 2.1 g/dL — ABNORMAL LOW (ref 3.5–5.0)
Anion gap: 11 (ref 5–15)
BUN: 23 mg/dL (ref 8–23)
CO2: 25 mmol/L (ref 22–32)
Calcium: 7.7 mg/dL — ABNORMAL LOW (ref 8.9–10.3)
Chloride: 96 mmol/L — ABNORMAL LOW (ref 98–111)
Creatinine, Ser: 3.83 mg/dL — ABNORMAL HIGH (ref 0.44–1.00)
GFR, Estimated: 12 mL/min — ABNORMAL LOW (ref >60)
Glucose, Bld: 89 mg/dL (ref 70–99)
Phosphorus: 3.7 mg/dL (ref 2.5–4.6)
Potassium: 4.2 mmol/L (ref 3.5–5.1)
Sodium: 132 mmol/L — ABNORMAL LOW (ref 135–145)

## 2023-06-03 LAB — MRSA NEXT GEN BY PCR, NASAL: MRSA by PCR Next Gen: NOT DETECTED

## 2023-06-03 LAB — CBC
HCT: 24 % — ABNORMAL LOW (ref 36.0–46.0)
Hemoglobin: 7.6 g/dL — ABNORMAL LOW (ref 12.0–15.0)
MCH: 27 pg (ref 26.0–34.0)
MCHC: 31.7 g/dL (ref 30.0–36.0)
MCV: 85.1 fL (ref 80.0–100.0)
Platelets: 401 10*3/uL — ABNORMAL HIGH (ref 150–400)
RBC: 2.82 MIL/uL — ABNORMAL LOW (ref 3.87–5.11)
RDW: 17.4 % — ABNORMAL HIGH (ref 11.5–15.5)
WBC: 10.7 10*3/uL — ABNORMAL HIGH (ref 4.0–10.5)
nRBC: 0 % (ref 0.0–0.2)

## 2023-06-03 LAB — SUSCEPTIBILITY, AER + ANAEROB

## 2023-06-03 MED ORDER — PROMETHAZINE HCL 12.5 MG PO TABS
12.5000 mg | ORAL_TABLET | Freq: Once | ORAL | Status: AC | PRN
  Administered 2023-06-03: 12.5 mg via ORAL
  Filled 2023-06-03: qty 1

## 2023-06-03 MED ORDER — AMOXICILLIN-POT CLAVULANATE 250-125 MG PO TABS
1.0000 | ORAL_TABLET | Freq: Two times a day (BID) | ORAL | Status: DC
  Administered 2023-06-03 – 2023-06-04 (×5): 1 via ORAL
  Filled 2023-06-03 (×5): qty 1

## 2023-06-03 NOTE — Progress Notes (Signed)
Hospital day#8 Subjective:   Summary: Samantha Fletcher with a pertinent past medical history of HTN, suspected COPD, paroxysmal atrial fibrillation, chronic pain, recurrent UTIs on hospital day 7 after being admitted for AKI likely secondary to ischemic ATN with oliguria s/p large volume of IVF therapy c/b pulmonary edema and pleural effusions currently undergoing HD. Fourth HD session 7/08. PFT normal 2015.   For overnight events, pt. refused PIV line for antibiotics. Continues to have a cough with clear sputum. Receiving O2 at 2L via [place]. Interview limited by patient participation. Remained in bed with eyes closed while replying to team in short phrases. Patient expressed feeling tired, ended team interview early.   Objective:  Vital signs in last 24 hours: Vitals:   06/03/23 0015 06/03/23 0412 06/03/23 0438 06/03/23 1135  BP: (!) 152/68 (!) 160/93  (!) 155/82  Pulse:  89  65  Resp:  18 17 19   Temp:  98.4 F (36.9 C)  98.3 F (36.8 C)  TempSrc:  Oral  Oral  SpO2:  96%  99%  Weight:   53.7 kg   Height:       Supplemental O2: Nasal Cannula SpO2: 99 % O2 Flow Rate (L/min): 2 L/min FiO2 (%): 28 % Filed Weights   06/02/23 0458 06/02/23 1735 06/03/23 0438  Weight: 60.1 kg 59.1 kg 53.7 kg    Physical Exam:  **Physical exam today limited by patient participation  Constitutional: Patient is resting with head of bed elevated, eyes closed. Would speak 1-2 word responses with verbal prompting.   Intake/Output Summary (Last 24 hours) at 06/03/2023 1536 Last data filed at 06/03/2023 1446 Gross per 24 hour  Intake 100 ml  Output 1300 ml  Net -1200 ml   Net IO Since Admission: 1,841.69 mL [06/03/23 1536]  Pertinent Labs:    Latest Ref Rng & Units 06/03/2023   12:48 AM 06/02/2023    1:00 AM 06/01/2023    6:38 AM  CBC  WBC 4.0 - 10.5 K/uL 10.7  10.1    Hemoglobin 12.0 - 15.0 g/dL 7.6  8.1  8.1   Hematocrit 36.0 - 46.0 % 24.0  24.8  25.2   Platelets 150 - 400 K/uL 401  357          Latest Ref Rng & Units 06/03/2023   12:48 AM 06/02/2023    1:00 AM 06/01/2023    6:38 AM  CMP  Glucose 70 - 99 mg/dL 89  409  87   BUN 8 - 23 mg/dL 23  30  21    Creatinine 0.44 - 1.00 mg/dL 8.11  9.14  7.82   Sodium 135 - 145 mmol/L 132  133  133   Potassium 3.5 - 5.1 mmol/L 4.2  4.2  4.3   Chloride 98 - 111 mmol/L 96  96  96   CO2 22 - 32 mmol/L 25  26  26    Calcium 8.9 - 10.3 mg/dL 7.7  7.6  7.7    Component Ref Range & Units 00:48 (06/03/23) 1 d ago (06/02/23) 2 d ago (06/01/23) 3 d ago (05/31/23) 3 d ago (05/31/23) 3 d ago (05/31/23) 4 d ago (05/30/23)  Sodium 135 - 145 mmol/L 132 Low  133 Low  133 Low  134 Low   130 Low  130 Low   Potassium 3.5 - 5.1 mmol/L 4.2 4.2 4.3 4.2  4.6 4.5  Chloride 98 - 111 mmol/L 96 Low  96 Low  96 Low  93 Low  94 Low  91 Low   CO2 22 - 32 mmol/L 25 26 26 26  26 26   Glucose, Bld 70 - 99 mg/dL 89 161 High  CM 87 CM 096 High  CM  106 High  CM 92 CM  Comment: Glucose reference range applies only to samples taken after fasting for at least 8 hours.  BUN 8 - 23 mg/dL 23 30 High  21 39 High   36 High  57 High   Creatinine, Ser 0.44 - 1.00 mg/dL 0.45 High  4.09 High  8.11 High  5.48 High   5.20 High  6.62 High   Calcium 8.9 - 10.3 mg/dL 7.7 Low  7.6 Low  7.7 Low  7.5 Low   7.5 Low  7.3 Low   Phosphorus 2.5 - 4.6 mg/dL 3.7 5.2 High  4.0 4.7 High   5.1 High  6.6 High   Albumin 3.5 - 5.0 g/dL 2.1 Low  2.1 Low  2.3 Low  2.2 Low  2.3 Low  2.3 Low  2.2 Low   GFR, Estimated >60 mL/min 12 Low  9 Low  CM 11 Low  CM 8 Low  CM  8 Low  CM 6 Low  CM  Comment: (NOTE) Calculated using the CKD-EPI Creatinine Equation (2021)  Anion gap 5 - 15 11 11  CM 11 CM 15 CM  10 CM 13 CM    Imaging: DG CHEST PORT 1 VIEW  Result Date: 06/02/2023 CLINICAL DATA:  10031.  Inpatient stat x-ray for cough. EXAM: PORTABLE CHEST 1 VIEW COMPARISON:  Portable chest today at 8:42 a.m. FINDINGS: 8:09 p.m. Right IJ dialysis catheter again terminates in the upper right atrium. Spinal stimulator  wiring terminates at T4. The heart is enlarged. There is mild central vascular prominence without overt edema. Small layering pleural effusions on the left-greater-than-right with overlying atelectasis or consolidation continue to be seen, with fluid extending into the horizontal fissure on the right. Remaining lungs are clear. The aorta is tortuous and calcified with stable mediastinum. Osteopenia. Vascular calcifications are again noted in the innominate artery. IMPRESSION: 1. Cardiomegaly with mild central vascular prominence without overt edema. 2. Small layering pleural effusions on the left-greater-than-right with overlying atelectasis or consolidation, unchanged. Electronically Signed   By: Almira Bar M.D.   On: 06/02/2023 20:22   DG CHEST PORT 1 VIEW  Result Date: 06/02/2023 CLINICAL DATA:  10031 Cough 10031 EXAM: PORTABLE CHEST 1 VIEW COMPARISON:  CXR 05/30/23 FINDINGS: Redemonstrated small bilateral pleural effusions and superimposed hazy bibasilar airspace opacities, unchanged from prior exam. Unchanged cardiac and mediastinal contours. No radiographically apparent new displaced rib fractures. Right-sided central venous catheter and spinal nerve stimulator leads in place. Visualized upper abdomen is unremarkable. IMPRESSION: Unchanged small bilateral pleural effusions and superimposed hazy bibasilar airspace opacities, which could represent atelectasis or infection. Electronically Signed   By: Lorenza Cambridge M.D.   On: 06/02/2023 08:59     Assessment/Plan:   Principal Problem:   Acute renal failure (ARF) (HCC) Active Problems:   Anxiety   Anemia, iron deficiency   Tobacco use disorder   Acute pulmonary edema (HCC)   Bilateral pleural effusion  AKI with oliguria Ischemic ATN Acute hypoxemic respiratory failure, improved Aspiration pneumonia/leukocytosis  Status post fourth HD session through the tunneled HD catheter. Last HD session 7/08. CXR 7/08 with bilateral pleural effusions and  bibasilar airspace opacities, relatively similar to CXR 7/05. However, CXR 7/09 showing small layering pleural effusions on the left-greater-than-right with overlying atelectasis or consolidation,  concerning for aspiration pneumonia. Clinically correlated with worsening cough, clear sputum, increased O2 need via nasal cannula, and WBC 10.7. Questionable lung fibrosis given previous scarring found on previous CXR in the context of suspected COPD with worsening cough, sputum, and shortness of breath. Remains afebrile. On aspiration precautions but eating and drinking unmodified diet. Declined SLP swallowing evaluation on 7/08. Per nursing and therapy notes, patient is declining to participate in multiple services. Will continue to monitor vital signs, specifically respiratory status, electrolytes, and signs of renal recovery. Nephrology following, recommended holding off diuresis today.  Plan:  -PO Augmentin 7/09-7/14 -Monitor I/Os -Supplemental O2 -O2 paremeters  -Avoid nephrotoxins -Lokelma daily per nephrology -SLP consult for aspiration/modified diet pending  Normocytic anemia Hx iron deficiency anemia Stable hemoglobin today at 7.6.  No overt signs of bleeding per mouth or rectum.  -Minimize blood draws -Monitor for signs of bleeding -Continue to monitor  HTN Intermittently hypertensive today. Last three blood pressures 155/82, 160/93, 152/68. Holding home meds in setting of low normal, but given rising blood pressures will slowly restart home medications and continue to monitor vitals.  Plan:  - Continue amlodipine 5 mg daily   Chronic back pain -Acetaminophen scheduled -Dilaudid 1mg  PRN  Diet: Renal VTE: Heparin Code: DNR PT/OT recs: SNF for Subacute PT,  Dispo: Anticipated discharge to Skilled nursing facility in 1 day pending clinical improvement.   Ignazio Kincaid Colbert Coyer, MD Internal Medicine Resident PGY-1 Please contact the on call pager after 5 pm and on weekends at  [phone].

## 2023-06-03 NOTE — Progress Notes (Signed)
PT Cancellation Note  Patient Details Name: Samantha Fletcher MRN: [id number] DOB: 1946/10/29   Cancelled Treatment:    Reason Eval/Treat Not Completed: Patient declined, no reason specified - pt declines, stating she doesn't feel good. Continues to refuse with mod encouragement, requests PT check back if able.   Marye Round, PT DPT Acute Rehabilitation Services Secure Chat Preferred  Office [phone]    Amya Hlad Sheliah Plane 06/03/2023, 9:04 AM

## 2023-06-03 NOTE — Progress Notes (Signed)
SLP Cancellation Note  Patient Details Name: Samantha Fletcher MRN: [id number] DOB: 11-May-1946   Cancelled treatment:        Attempted clinical swallowing assessment.  Pt asleep on SLP arrival but able to rouse Pt endorses that she has been coughing more with POs than usual, but denies swallowing deficits.  SLP again provided education regarding risks of aspiration.  Pt continues to adamantly refuse evaluation.   Kerrie Pleasure, MA, CCC-SLP Acute Rehabilitation Services Office: [phone] 06/03/2023, 11:31 AM

## 2023-06-03 NOTE — Progress Notes (Signed)
Patient ID: Samantha Fletcher, female   DOB: Apr 19, 1946, 77 y.o.   MRN: [id number] S: Continue dyspnea and chest pain.  Cough present.  Didn't feel much better after HD yest with 1L UF. UOP yest from 60mL day prior.   O:BP (!) 160/93 (BP Location: Left Arm)   Pulse 89   Temp 98.4 F (36.9 C) (Oral)   Resp 17   Ht 5\' 2"  (1.575 m)   Wt 53.7 kg   LMP  (LMP Unknown)   SpO2 96%   BMI 21.65 kg/m   Intake/Output Summary (Last 24 hours) at 06/03/2023 1131 Last data filed at 06/03/2023 0416 Gross per 24 hour  Intake 100 ml  Output 1200 ml  Net -1100 ml     Gen: NAD CVS: RRR Resp:dec BS bases, normal WOB at rest Abd:+BS, soft, NT/nD  Ext: no edema RIJ TDC c/d/i  Recent Labs  Lab 05/29/23 0026 05/30/23 0043 05/31/23 0036 05/31/23 2200 06/01/23 0638 06/02/23 0100 06/03/23 0048  NA 129* 130* 130* 134* 133* 133* 132*  K 4.3 4.5 4.6 4.2 4.3 4.2 4.2  CL 92* 91* 94* 93* 96* 96* 96*  CO2 24 26 26 26 26 26 25   GLUCOSE 86 92 106* 113* 87 105* 89  BUN 53* 57* 36* 39* 21 30* 23  CREATININE 5.65* 6.62* 5.20* 5.48* 3.95* 4.75* 3.83*  ALBUMIN 2.2* 2.2* 2.3*  2.3* 2.2* 2.3* 2.1* 2.1*  CALCIUM 7.2* 7.3* 7.5* 7.5* 7.7* 7.6* 7.7*  PHOS 4.6 6.6* 5.1* 4.7* 4.0 5.2* 3.7  AST  --   --  18  --   --   --   --   ALT  --   --  7  --   --   --   --     Liver Function Tests: Recent Labs  Lab 05/31/23 0036 05/31/23 2200 06/01/23 0638 06/02/23 0100 06/03/23 0048  AST 18  --   --   --   --   ALT 7  --   --   --   --   ALKPHOS 66  --   --   --   --   BILITOT 0.3  --   --   --   --   PROT 5.1*  --   --   --   --   ALBUMIN 2.3*  2.3*   < > 2.3* 2.1* 2.1*   < > = values in this interval not displayed.    No results for input(s): "LIPASE", "AMYLASE" in the last 168 hours.  No results for input(s): "AMMONIA" in the last 168 hours. CBC: Recent Labs  Lab 05/30/23 1851 05/31/23 0036 05/31/23 2200 06/01/23 0638 06/02/23 0100 06/03/23 0048  WBC 10.9* 11.1* 12.3*  --  10.1 10.7*   NEUTROABS  --  9.2*  --   --   --   --   HGB 8.3* 7.7* 7.5* 8.1* 8.1* 7.6*  HCT 24.9* 23.1* 23.4* 25.2* 24.8* 24.0*  MCV 83.6 83.1 84.5  --  84.9 85.1  PLT 260 289 306  --  357 401*    Cardiac Enzymes: No results for input(s): "CKTOTAL", "CKMB", "CKMBINDEX", "TROPONINI" in the last 168 hours. CBG: No results for input(s): "GLUCAP" in the last 168 hours.  Iron Studies:  No results for input(s): "IRON", "TIBC", "TRANSFERRIN", "FERRITIN" in the last 72 hours.  Studies/Results: DG CHEST PORT 1 VIEW  Result Date: 06/02/2023 CLINICAL DATA:  10031.  Inpatient stat x-ray for cough. EXAM: PORTABLE CHEST  1 VIEW COMPARISON:  Portable chest today at 8:42 a.m. FINDINGS: 8:09 p.m. Right IJ dialysis catheter again terminates in the upper right atrium. Spinal stimulator wiring terminates at T4. The heart is enlarged. There is mild central vascular prominence without overt edema. Small layering pleural effusions on the left-greater-than-right with overlying atelectasis or consolidation continue to be seen, with fluid extending into the horizontal fissure on the right. Remaining lungs are clear. The aorta is tortuous and calcified with stable mediastinum. Osteopenia. Vascular calcifications are again noted in the innominate artery. IMPRESSION: 1. Cardiomegaly with mild central vascular prominence without overt edema. 2. Small layering pleural effusions on the left-greater-than-right with overlying atelectasis or consolidation, unchanged. Electronically Signed   By: Almira Bar M.D.   On: 06/02/2023 20:22   DG CHEST PORT 1 VIEW  Result Date: 06/02/2023 CLINICAL DATA:  10031 Cough 10031 EXAM: PORTABLE CHEST 1 VIEW COMPARISON:  CXR 05/30/23 FINDINGS: Redemonstrated small bilateral pleural effusions and superimposed hazy bibasilar airspace opacities, unchanged from prior exam. Unchanged cardiac and mediastinal contours. No radiographically apparent new displaced rib fractures. Right-sided central venous catheter  and spinal nerve stimulator leads in place. Visualized upper abdomen is unremarkable. IMPRESSION: Unchanged small bilateral pleural effusions and superimposed hazy bibasilar airspace opacities, which could represent atelectasis or infection. Electronically Signed   By: Lorenza Cambridge M.D.   On: 06/02/2023 08:59    acetaminophen  1,000 mg Oral Q8H   amLODipine  5 mg Oral Daily   amoxicillin-clavulanate  1 tablet Oral Q12H   Chlorhexidine Gluconate Cloth  6 each Topical Q0600   Chlorhexidine Gluconate Cloth  6 each Topical Q0600   heparin injection (subcutaneous)  5,000 Units Subcutaneous Q8H   hydrocortisone cream   Topical BID   hydrOXYzine  25 mg Oral BID   levothyroxine  75 mcg Oral Q0600   lidocaine  1 patch Transdermal Q24H   lidocaine  1 Application Urethral Daily   lidocaine-EPINEPHrine  10 mL Infiltration Once   melatonin  3 mg Oral QHS   pantoprazole  40 mg Oral Daily   polyethylene glycol  17 g Oral Daily   senna-docusate  2 tablet Oral BID   sodium zirconium cyclosilicate  10 g Oral Daily    BMET    Component Value Date/Time   NA 132 (L) 06/03/2023 0048   K 4.2 06/03/2023 0048   CL 96 (L) 06/03/2023 0048   CO2 25 06/03/2023 0048   GLUCOSE 89 06/03/2023 0048   BUN 23 06/03/2023 0048   CREATININE 3.83 (H) 06/03/2023 0048   CALCIUM 7.7 (L) 06/03/2023 0048   GFRNONAA 12 (L) 06/03/2023 0048   GFRAA >60 10/10/2016 0556   CBC    Component Value Date/Time   WBC 10.7 (H) 06/03/2023 0048   RBC 2.82 (L) 06/03/2023 0048   HGB 7.6 (L) 06/03/2023 0048   HCT 24.0 (L) 06/03/2023 0048   PLT 401 (H) 06/03/2023 0048   MCV 85.1 06/03/2023 0048   MCH 27.0 06/03/2023 0048   MCHC 31.7 06/03/2023 0048   RDW 17.4 (H) 06/03/2023 0048   LYMPHSABS 0.8 05/31/2023 0036   MONOABS 0.4 05/31/2023 0036   EOSABS 0.5 05/31/2023 0036   BASOSABS 0.0 05/31/2023 0036     Assessment/Plan:  AKI, oliguric - Scr was normal in May.  By history this is most consistent with ischemic ATN in setting of  N/V for the past week with concomitant ARB therapy.  No evidence of cortical necrosis on renal US, no NSAID use, time course too  short for AIN, no obstruction.  Negative ANA, dsDNA, ASO, ANCA, and normal complements.  She had TDC placed 05/28/23 by IR, s/p first HD session 05/29/23, 2nd 7/5, 3rd 7/7, last 7/8.  UOP remains poor but did increase some.  Continue daily labs and assessment for HD needs.     Avoid nephrotoxic medications including NSAIDs and iodinated intravenous contrast exposure unless the latter is absolutely indicated.  Preferred narcotic agents for pain control are hydromorphone, fentanyl, and methadone. Morphine should not be used. Avoid Baclofen and avoid oral sodium phosphate and magnesium citrate based laxatives / bowel preps. Continue strict Input and Output monitoring. Will monitor the patient closely with you and intervene or adjust therapy as indicated by changes in clinical status/labs  Hyponatremia - mild and stable HTN- losartan on hold with AKI,  amlodipine was started yesterday; acceptable, avoid hypotension Low back pain - chronic Abdominal pain and persistent nausea - CT of abd/pelvis without source of symptoms.  Per primary.  COPD - per primary svc; CXR today with persistent effusions and opacities ? Bronchitis.  UF with HD didn't help, doesn't appear pulm edema contributing. Normocytic anemia - TSAT 21%, dosed with IV iron and follow.  Transfused 1 unit PRBC 05/30/23 and 7-8s now Remain inpatient for now pending improvement in kidney function; if not observed in next few days will need outpt HD arranged.   Estill Bakes MD [facility] Pager [phone]

## 2023-06-04 DIAGNOSIS — D72829 Elevated white blood cell count, unspecified: Secondary | ICD-10-CM | POA: Diagnosis not present

## 2023-06-04 DIAGNOSIS — J690 Pneumonitis due to inhalation of food and vomit: Secondary | ICD-10-CM

## 2023-06-04 DIAGNOSIS — M549 Dorsalgia, unspecified: Secondary | ICD-10-CM

## 2023-06-04 DIAGNOSIS — N170 Acute kidney failure with tubular necrosis: Secondary | ICD-10-CM | POA: Diagnosis not present

## 2023-06-04 DIAGNOSIS — D649 Anemia, unspecified: Secondary | ICD-10-CM | POA: Diagnosis not present

## 2023-06-04 DIAGNOSIS — G8929 Other chronic pain: Secondary | ICD-10-CM

## 2023-06-04 DIAGNOSIS — Z87891 Personal history of nicotine dependence: Secondary | ICD-10-CM

## 2023-06-04 LAB — CBC
HCT: 25.5 % — ABNORMAL LOW (ref 36.0–46.0)
Hemoglobin: 8 g/dL — ABNORMAL LOW (ref 12.0–15.0)
MCH: 27.1 pg (ref 26.0–34.0)
MCHC: 31.4 g/dL (ref 30.0–36.0)
MCV: 86.4 fL (ref 80.0–100.0)
Platelets: 431 10*3/uL — ABNORMAL HIGH (ref 150–400)
RBC: 2.95 MIL/uL — ABNORMAL LOW (ref 3.87–5.11)
RDW: 17.2 % — ABNORMAL HIGH (ref 11.5–15.5)
WBC: 13.7 10*3/uL — ABNORMAL HIGH (ref 4.0–10.5)
nRBC: 0 % (ref 0.0–0.2)

## 2023-06-04 LAB — RENAL FUNCTION PANEL
Albumin: 2.1 g/dL — ABNORMAL LOW (ref 3.5–5.0)
Anion gap: 12 (ref 5–15)
BUN: 31 mg/dL — ABNORMAL HIGH (ref 8–23)
CO2: 24 mmol/L (ref 22–32)
Calcium: 8.1 mg/dL — ABNORMAL LOW (ref 8.9–10.3)
Chloride: 96 mmol/L — ABNORMAL LOW (ref 98–111)
Creatinine, Ser: 4.82 mg/dL — ABNORMAL HIGH (ref 0.44–1.00)
GFR, Estimated: 9 mL/min — ABNORMAL LOW (ref >60)
Glucose, Bld: 87 mg/dL (ref 70–99)
Phosphorus: 4.9 mg/dL — ABNORMAL HIGH (ref 2.5–4.6)
Potassium: 4.8 mmol/L (ref 3.5–5.1)
Sodium: 132 mmol/L — ABNORMAL LOW (ref 135–145)

## 2023-06-04 LAB — SUSCEPTIBILITY RESULT

## 2023-06-04 MED ORDER — ACETAMINOPHEN 500 MG PO TABS
1000.0000 mg | ORAL_TABLET | Freq: Four times a day (QID) | ORAL | Status: DC | PRN
  Administered 2023-06-04 – 2023-06-06 (×3): 1000 mg via ORAL
  Filled 2023-06-04 (×4): qty 2

## 2023-06-04 MED ORDER — AMLODIPINE BESYLATE 10 MG PO TABS
10.0000 mg | ORAL_TABLET | Freq: Every day | ORAL | Status: DC
  Administered 2023-06-04 – 2023-06-09 (×6): 10 mg via ORAL
  Filled 2023-06-04 (×6): qty 1

## 2023-06-04 MED ORDER — HEPARIN SODIUM (PORCINE) 1000 UNIT/ML IJ SOLN
INTRAMUSCULAR | Status: AC
  Administered 2023-06-04: 1000 [IU]
  Filled 2023-06-04: qty 3

## 2023-06-04 MED ORDER — HEPARIN SODIUM (PORCINE) 1000 UNIT/ML DIALYSIS
1000.0000 [IU] | INTRAMUSCULAR | Status: DC | PRN
  Filled 2023-06-04 (×2): qty 1

## 2023-06-04 MED ORDER — ANTICOAGULANT SODIUM CITRATE 4% (200MG/5ML) IV SOLN: 5.0000 mL | Status: DC | PRN

## 2023-06-04 MED ORDER — SIMETHICONE 80 MG PO CHEW
80.0000 mg | CHEWABLE_TABLET | Freq: Once | ORAL | Status: AC
  Administered 2023-06-04: 80 mg via ORAL
  Filled 2023-06-04: qty 1

## 2023-06-04 MED ORDER — ALTEPLASE 2 MG IJ SOLR: 2.0000 mg | Freq: Once | INTRAMUSCULAR | Status: DC | PRN

## 2023-06-04 NOTE — Progress Notes (Signed)
Hospital day#9 Subjective:   Summary: Samantha Fletcher with a pertinent past medical history of HTN, suspected COPD, paroxysmal atrial fibrillation, chronic pain, recurrent UTIs on hospital day 7 after being admitted for AKI likely secondary to ischemic ATN with oliguria s/p large volume of IVF therapy c/b pulmonary edema and pleural effusions currently undergoing HD. Fourth HD session 7/08. PFT normal 2015.   No overnight events reported. Patient on 2L O2 via [place]. Feeling gassy and nauseated. Denied vomiting, diarrhea, chest pain, heart racing, or palpitations. Resting in bed in no acute distress. Able to complete full morning interview.   Objective:  Vital signs in last 24 hours: Vitals:   06/04/23 0425 06/04/23 0848 06/04/23 1650 06/04/23 1653  BP: (!) 148/76 (!) 155/73  121/62  Pulse:  72  70  Resp: 18 18  (!) 24  Temp: 99 F (37.2 C) 99.3 F (37.4 C) 100.2 F (37.9 C)   TempSrc: Oral Oral Oral   SpO2: 97% 98%  99%  Weight: 60.9 kg     Height:       Supplemental O2: Nasal Cannula SpO2: 99 % O2 Flow Rate (L/min): 2 L/min FiO2 (%): 28 % Filed Weights   06/02/23 1735 06/03/23 0438 06/04/23 0425  Weight: 59.1 kg 53.7 kg 60.9 kg    Physical Exam:    Constitutional: Patient is resting with head of bed elevated speaking in full sentences.  CV: RRR, no abnormal murmurs, rubs, or gallops. Pulmonary: Improved airflow across both lungs, in no acute distress. Abdominal: Soft, non-tender, positive bowel sounds.  Skin: warm and dry.  Psych: cooperative mood and normal affect.   Intake/Output Summary (Last 24 hours) at 06/04/2023 1658 Last data filed at 06/04/2023 0739 Gross per 24 hour  Intake 240 ml  Output 200 ml  Net 40 ml   Net IO Since Admission: 2,121.69 mL [06/04/23 1658]  Pertinent Labs:    Latest Ref Rng & Units 06/04/2023   12:54 AM 06/03/2023   12:48 AM 06/02/2023    1:00 AM  CBC  WBC 4.0 - 10.5 K/uL 13.7  10.7  10.1   Hemoglobin 12.0 - 15.0 g/dL 8.0  7.6  8.1    Hematocrit 36.0 - 46.0 % 25.5  24.0  24.8   Platelets 150 - 400 K/uL 431  401  357        Latest Ref Rng & Units 06/04/2023   12:54 AM 06/03/2023   12:48 AM 06/02/2023    1:00 AM  CMP  Glucose 70 - 99 mg/dL 87  89  161   BUN 8 - 23 mg/dL 31  23  30    Creatinine 0.44 - 1.00 mg/dL 0.96  0.45  4.09   Sodium 135 - 145 mmol/L 132  132  133   Potassium 3.5 - 5.1 mmol/L 4.8  4.2  4.2   Chloride 98 - 111 mmol/L 96  96  96   CO2 22 - 32 mmol/L 24  25  26    Calcium 8.9 - 10.3 mg/dL 8.1  7.7  7.6    Renal functional panel  Component Ref Range & Units 00:54 (06/04/23) 1 d ago (06/03/23) 2 d ago (06/02/23) 3 d ago (06/01/23) 4 d ago (05/31/23) 4 d ago (05/31/23) 4 d ago (05/31/23)  Sodium 135 - 145 mmol/L 132 Low  132 Low  133 Low  133 Low  134 Low   130 Low   Potassium 3.5 - 5.1 mmol/L 4.8 4.2 4.2 4.3 4.2  4.6  Chloride 98 - 111 mmol/L 96 Low  96 Low  96 Low  96 Low  93 Low   94 Low   CO2 22 - 32 mmol/L 24 25 26 26 26  26   Glucose, Bld 70 - 99 mg/dL 87 89 CM 161 High  CM 87 CM 113 High  CM  106 High  CM  Comment: Glucose reference range applies only to samples taken after fasting for at least 8 hours.  BUN 8 - 23 mg/dL 31 High  23 30 High  21 39 High   36 High   Creatinine, Ser 0.44 - 1.00 mg/dL 0.96 High  0.45 High  4.09 High  3.95 High  5.48 High   5.20 High   Calcium 8.9 - 10.3 mg/dL 8.1 Low  7.7 Low  7.6 Low  7.7 Low  7.5 Low   7.5 Low   Phosphorus 2.5 - 4.6 mg/dL 4.9 High  3.7 5.2 High  4.0 4.7 High   5.1 High   Albumin 3.5 - 5.0 g/dL 2.1 Low  2.1 Low  2.1 Low  2.3 Low  2.2 Low  2.3 Low  2.3 Low   GFR, Estimated >60 mL/min 9 Low  12 Low  CM 9 Low  CM 11 Low  CM 8 Low  CM  8 Low  CM   Imaging: N/a  Assessment/Plan:   Principal Problem:   Acute renal failure (ARF) (HCC) Active Problems:   Anxiety   Anemia, iron deficiency   Tobacco use disorder   Acute pulmonary edema (HCC)   Bilateral pleural effusion  AKI with oliguria Ischemic ATN Acute hypoxemic respiratory failure,  improved Aspiration pneumonia/leukocytosis  Status post fourth HD session through the tunneled HD catheter. CXR 7/09 showing small layering pleural effusions on the left-greater-than-right with overlying atelectasis or consolidation, concerning for aspiration pneumonia. Clinically correlated with worsening cough, clear sputum, increased O2 need via nasal cannula, and WBC 13.7. Remains afebrile. On aspiration precautions. Declined SLP swallowing evaluation on 7/10 due to nausea. Also declined working with PT 7/10 due to nausea. Will continue to monitor vital signs, specifically respiratory status, electrolytes, and signs of renal recovery. Nephrology following, recommended discontinuing foley catheter and doing a trial of pure wick. Patient receiving HD today 7/10. Renal navigator arranging services for o/p HD. Family preference for [facility]. Patient expressed preference for home health.  Plan:  -PO Augmentin 7/09-7/14 -Monitor I/Os, especially with pure wick  -Supplemental O2 as needed -O2 paremeters  -Avoid nephrotoxins -Lokelma daily per nephrology -SLP consult for aspiration/modified diet pending; will sign-off if patient does not participate   Normocytic anemia Hx iron deficiency anemia Stable hemoglobin today at 8.0.  No overt signs of bleeding per mouth or rectum.  -Minimize blood draws -Monitor for signs of bleeding -Continue to monitor  HTN Continues to be intermittently hypertensive. Holding home meds in setting of low normal, but given rising blood pressures will slowly restart home medications and continue to monitor vitals.  Plan:  - Continue amlodipine 5 mg daily   Chronic back pain -Acetaminophen scheduled -Dilaudid 1mg  PRN  Diet: Renal VTE: Heparin Code: DNR PT/OT recs: SNF for Subacute PT,  Dispo: Anticipated discharge to Skilled nursing facility in 1 day pending clinical improvement.   Dreamer Carillo Colbert Coyer, MD Internal Medicine Resident PGY-1 Please  contact the on call pager after 5 pm and on weekends at [phone].

## 2023-06-04 NOTE — Progress Notes (Signed)
PT Cancellation Note  Patient Details Name: CHRISTYNA LETENDRE MRN: [id number] DOB: [birth year]   Cancelled Treatment:    Reason Eval/Treat Not Completed: (P) Patient declined, no reason specified, pt adamantly refusing therapy this date, stating she "has been sick all morning" and "just wants to be left alone". Will check back as schedule allows to continue with PT POC.  Lenora Boys. PTA Acute Rehabilitation Services Office: [phone]    Catalina Antigua 06/04/2023, 11:32 AM

## 2023-06-04 NOTE — Progress Notes (Signed)
Patient ID: Samantha Fletcher, female   DOB: October 14, 1946, 77 y.o.   MRN: [id number] S: UOP only yesterday, Cr rose ~1mg /dL in 40J without dialysis.   Cont poor appetite and weak.  No other new issues.   O:BP (!) 148/76 (BP Location: Left Arm)   Pulse 73   Temp 99 F (37.2 C) (Oral)   Resp 18   Ht 5\' 2"  (1.575 m)   Wt 60.9 kg   LMP  (LMP Unknown)   SpO2 97%   BMI 24.56 kg/m   Intake/Output Summary (Last 24 hours) at 06/04/2023 0830 Last data filed at 06/04/2023 0739 Gross per 24 hour  Intake 240 ml  Output 300 ml  Net -60 ml     Gen: NAD CVS: RRR Resp:dec BS bases, normal WOB at rest  Abd:+BS, soft, NT/nD  Ext: no edema RIJ TDC c/d/i  Recent Labs  Lab 05/30/23 0043 05/31/23 0036 05/31/23 2200 06/01/23 0638 06/02/23 0100 06/03/23 0048 06/04/23 0054  NA 130* 130* 134* 133* 133* 132* 132*  K 4.5 4.6 4.2 4.3 4.2 4.2 4.8  CL 91* 94* 93* 96* 96* 96* 96*  CO2 26 26 26 26 26 25 24   GLUCOSE 92 106* 113* 87 105* 89 87  BUN 57* 36* 39* 21 30* 23 31*  CREATININE 6.62* 5.20* 5.48* 3.95* 4.75* 3.83* 4.82*  ALBUMIN 2.2* 2.3*  2.3* 2.2* 2.3* 2.1* 2.1* 2.1*  CALCIUM 7.3* 7.5* 7.5* 7.7* 7.6* 7.7* 8.1*  PHOS 6.6* 5.1* 4.7* 4.0 5.2* 3.7 4.9*  AST  --  18  --   --   --   --   --   ALT  --  7  --   --   --   --   --     Liver Function Tests: Recent Labs  Lab 05/31/23 0036 05/31/23 2200 06/02/23 0100 06/03/23 0048 06/04/23 0054  AST 18  --   --   --   --   ALT 7  --   --   --   --   ALKPHOS 66  --   --   --   --   BILITOT 0.3  --   --   --   --   PROT 5.1*  --   --   --   --   ALBUMIN 2.3*  2.3*   < > 2.1* 2.1* 2.1*   < > = values in this interval not displayed.    No results for input(s): "LIPASE", "AMYLASE" in the last 168 hours.  No results for input(s): "AMMONIA" in the last 168 hours. CBC: Recent Labs  Lab 05/31/23 0036 05/31/23 2200 06/01/23 0638 06/02/23 0100 06/03/23 0048 06/04/23 0054  WBC 11.1* 12.3*  --  10.1 10.7* 13.7*  NEUTROABS 9.2*  --   --    --   --   --   HGB 7.7* 7.5*   < > 8.1* 7.6* 8.0*  HCT 23.1* 23.4*   < > 24.8* 24.0* 25.5*  MCV 83.1 84.5  --  84.9 85.1 86.4  PLT 289 306  --  357 401* 431*   < > = values in this interval not displayed.    Cardiac Enzymes: No results for input(s): "CKTOTAL", "CKMB", "CKMBINDEX", "TROPONINI" in the last 168 hours. CBG: No results for input(s): "GLUCAP" in the last 168 hours.  Iron Studies:  No results for input(s): "IRON", "TIBC", "TRANSFERRIN", "FERRITIN" in the last 72 hours.  Studies/Results: DG CHEST PORT 1 VIEW  Result  Date: 06/02/2023 CLINICAL DATA:  10031.  Inpatient stat x-ray for cough. EXAM: PORTABLE CHEST 1 VIEW COMPARISON:  Portable chest today at 8:42 a.m. FINDINGS: 8:09 p.m. Right IJ dialysis catheter again terminates in the upper right atrium. Spinal stimulator wiring terminates at T4. The heart is enlarged. There is mild central vascular prominence without overt edema. Small layering pleural effusions on the left-greater-than-right with overlying atelectasis or consolidation continue to be seen, with fluid extending into the horizontal fissure on the right. Remaining lungs are clear. The aorta is tortuous and calcified with stable mediastinum. Osteopenia. Vascular calcifications are again noted in the innominate artery. IMPRESSION: 1. Cardiomegaly with mild central vascular prominence without overt edema. 2. Small layering pleural effusions on the left-greater-than-right with overlying atelectasis or consolidation, unchanged. Electronically Signed   By: Almira Bar M.D.   On: 06/02/2023 20:22   DG CHEST PORT 1 VIEW  Result Date: 06/02/2023 CLINICAL DATA:  10031 Cough 10031 EXAM: PORTABLE CHEST 1 VIEW COMPARISON:  CXR 05/30/23 FINDINGS: Redemonstrated small bilateral pleural effusions and superimposed hazy bibasilar airspace opacities, unchanged from prior exam. Unchanged cardiac and mediastinal contours. No radiographically apparent new displaced rib fractures. Right-sided  central venous catheter and spinal nerve stimulator leads in place. Visualized upper abdomen is unremarkable. IMPRESSION: Unchanged small bilateral pleural effusions and superimposed hazy bibasilar airspace opacities, which could represent atelectasis or infection. Electronically Signed   By: Lorenza Cambridge M.D.   On: 06/02/2023 08:59    acetaminophen  1,000 mg Oral Q8H   amLODipine  5 mg Oral Daily   amoxicillin-clavulanate  1 tablet Oral Q12H   Chlorhexidine Gluconate Cloth  6 each Topical Q0600   heparin injection (subcutaneous)  5,000 Units Subcutaneous Q8H   hydrocortisone cream   Topical BID   hydrOXYzine  25 mg Oral BID   levothyroxine  75 mcg Oral Q0600   lidocaine  1 patch Transdermal Q24H   lidocaine  1 Application Urethral Daily   lidocaine-EPINEPHrine  10 mL Infiltration Once   melatonin  3 mg Oral QHS   pantoprazole  40 mg Oral Daily   polyethylene glycol  17 g Oral Daily   senna-docusate  2 tablet Oral BID   simethicone  80 mg Oral Once   sodium zirconium cyclosilicate  10 g Oral Daily    BMET    Component Value Date/Time   NA 132 (L) 06/04/2023 0054   K 4.8 06/04/2023 0054   CL 96 (L) 06/04/2023 0054   CO2 24 06/04/2023 0054   GLUCOSE 87 06/04/2023 0054   BUN 31 (H) 06/04/2023 0054   CREATININE 4.82 (H) 06/04/2023 0054   CALCIUM 8.1 (L) 06/04/2023 0054   GFRNONAA 9 (L) 06/04/2023 0054   GFRAA >60 10/10/2016 0556   CBC    Component Value Date/Time   WBC 13.7 (H) 06/04/2023 0054   RBC 2.95 (L) 06/04/2023 0054   HGB 8.0 (L) 06/04/2023 0054   HCT 25.5 (L) 06/04/2023 0054   PLT 431 (H) 06/04/2023 0054   MCV 86.4 06/04/2023 0054   MCH 27.1 06/04/2023 0054   MCHC 31.4 06/04/2023 0054   RDW 17.2 (H) 06/04/2023 0054   LYMPHSABS 0.8 05/31/2023 0036   MONOABS 0.4 05/31/2023 0036   EOSABS 0.5 05/31/2023 0036   BASOSABS 0.0 05/31/2023 0036     Assessment/Plan:  AKI, oliguric - Scr was normal in May.  By history this is most consistent with ischemic ATN in  setting of N/V for the past week with concomitant ARB therapy.  No evidence of cortical necrosis on renal US, no NSAID use, time course too short for AIN, no obstruction.  Negative ANA, dsDNA, ASO, ANCA, and normal complements.  She had TDC placed 05/28/23 by IR, s/p first HD session 05/29/23, 2nd 7/5, 3rd 7/7, last 7/8.  Remains oliguric  Continue daily labs and assessment for HD needs --> HD today and start process for outpt AKI dialysis placement.  Given normal baseline function still expect recovery.  D/C foley. Trial of void and purewick for I/Os.  Avoid nephrotoxic medications including NSAIDs and iodinated intravenous contrast exposure unless the latter is absolutely indicated.  Preferred narcotic agents for pain control are hydromorphone, fentanyl, and methadone. Morphine should not be used. Avoid Baclofen and avoid oral sodium phosphate and magnesium citrate based laxatives / bowel preps. Continue strict Input and Output monitoring. Will monitor the patient closely with you and intervene or adjust therapy as indicated by changes in clinical status/labs  Hyponatremia - mild and stable HTN- losartan on hold with AKI,  amlodipine titrated today. Low back pain - chronic Abdominal pain and persistent nausea - CT of abd/pelvis without source of symptoms and seems to be improved somewhat.  Per primary.  COPD - per primary svc; CXR this wek with persistent effusions and opacities ? Bronchitis.  UF with HD didn't help, doesn't appear pulm edema contributing. Normocytic anemia - TSAT 21%, dosed with IV iron and follow.  Transfused 1 unit PRBC 05/30/23 and 7-8s now.  Remain inpatient for now pending arrangement of outpt dialysis.   Estill Bakes MD [facility] Pager [phone]

## 2023-06-04 NOTE — TOC Progression Note (Signed)
Transition of Care [facility]) - Progression Note    Patient Details  Name: Samantha Fletcher MRN: [id number] Date of Birth: [birth year]  Transition of Care [facility]) CM/SW Contact  Carmina Miller, [place] Phone Number: 06/04/2023, 2:07 PM  Clinical Narrative:     CSW spoke with pt's sister Darel Hong regarding transportation assistance for pt to get to HD on Saturdays, options are limited but CSW texted information to pt's sister for [facility], sister appreciative.   Expected Discharge Plan: Home/Self Care Barriers to Discharge: Continued Medical Work up  Expected Discharge Plan and Services In-house Referral: NA Discharge Planning Services: CM Consult Post Acute Care Choice: NA Living arrangements for the past 2 months: Single Family Home                 DME Arranged: N/A DME Agency: NA       HH Arranged: NA           Social Determinants of Health (SDOH) Interventions SDOH Screenings   Food Insecurity: No Food Insecurity (05/26/2023)  Housing: Low Risk  (05/26/2023)  Transportation Needs: No Transportation Needs (05/26/2023)  Utilities: Not At Risk (05/26/2023)  Tobacco Use: Medium Risk (05/28/2023)    Readmission Risk Interventions     No data to display

## 2023-06-04 NOTE — Progress Notes (Signed)
Received patient in bed to unit.  Alert and oriented.  Informed consent signed and in chart. yes  TX duration:3hrs  Patient tolerated well. yes Transported back to the room yes Alert, without acute distress. none Hand-off given to patient's nurse. Almyra Brace RN  Access used: yes Access issues: none  Total UF removed: 2000 Medication(s) given: tylenol 1000mg  Post HD VS: 135/61 hr 76 Post HD weight: 56.5kg   [facility]

## 2023-06-04 NOTE — TOC Progression Note (Signed)
Transition of Care [facility]) - Progression Note    Patient Details  Name: Samantha Fletcher MRN: [id number] Date of Birth: 01/09/1946  Transition of Care [facility]) CM/SW Contact  Delilah Shan, LCSWA Phone Number: 06/04/2023, 10:49 AM  Clinical Narrative:     CSW received consult from MD patient may be interested in SNF. CSW spoke with patient at bedside. Patient request for CSW to reach out to [facility] facility she was previously at for short term rehab to see if in copay days. CSW spoke with French Ana with [facility] who informed CSW that patient is in her copay days. CSW informed Patient. Patient informed CSW that she would like to return home when medically ready for dc. All questions answered. No further questions reported at this time.CSW informed MD and CM.   Expected Discharge Plan: Home/Self Care Barriers to Discharge: Continued Medical Work up  Expected Discharge Plan and Services In-house Referral: NA Discharge Planning Services: CM Consult Post Acute Care Choice: NA Living arrangements for the past 2 months: Single Family Home                 DME Arranged: N/A DME Agency: NA       HH Arranged: NA           Social Determinants of Health (SDOH) Interventions SDOH Screenings   Food Insecurity: No Food Insecurity (05/26/2023)  Housing: Low Risk  (05/26/2023)  Transportation Needs: No Transportation Needs (05/26/2023)  Utilities: Not At Risk (05/26/2023)  Tobacco Use: Medium Risk (05/28/2023)    Readmission Risk Interventions     No data to display

## 2023-06-04 NOTE — Progress Notes (Signed)
SLP Cancellation Note  Patient Details Name: Samantha Fletcher MRN: [id number] DOB: Dec 26, 1945   Cancelled treatment:        Pt awake but refuses to eat because of pain and nausea. With encouragement she would not take any po's but stated therapist could come back tomorrow. Will try once more to assess swallow. If she refuses again will need to sign off.    Royce Macadamia 06/04/2023, 9:26 AM

## 2023-06-04 NOTE — Progress Notes (Signed)
Requested to see pt for out-pt HD needs at d/c. Met with pt at bedside. Introduced self and explained role. Discussed out-pt HD options. Pt states that it depends on who will transport pt to HD as to which clinic would be best. Pt states that son or pt's sister may provide transportation. Inquired of pt if navigator could contact sister since she may provide transportation to see what she thinks is best. Pt agreeable to navigator calling sister. Spoke to pt's sister, Darel Hong, via phone. Sister confirms that she can transport pt and would prefer the [facility] clinic if possible. Referral submitted to [facility] for review. Contacted TOC staff to make aware of pt's need for HD at d/c. Will assist as needed.   Olivia Canter Renal Navigator [phone]

## 2023-06-04 NOTE — Progress Notes (Signed)
   06/04/23 2001  Vitals  Temp 98.5 F (36.9 C)  Temp Source Oral  BP Location Left Arm  BP Method Automatic  Patient Position (if appropriate) Lying  Pulse Rate Source Monitor  Oxygen Therapy  O2 Device Nasal Cannula  O2 Flow Rate (L/min) 2 L/min  Pulse Oximetry Type Continuous  During Treatment Monitoring  Intra-Hemodialysis Comments Tx completed  Post Treatment  Dialyzer Clearance Lightly streaked  Duration of HD Treatment -hour(s) 3 hour(s)  Hemodialysis Intake (mL) 0 mL  Liters Processed 63  Fluid Removed (mL) 2000 mL  Tolerated HD Treatment Yes  Post-Hemodialysis Comments pt stable  Hemodialysis Catheter Right Internal jugular Double lumen Permanent (Tunneled)  Placement Date/Time: 05/28/23 1146   Serial / Lot #: [id number]  Expiration Date: 08/31/27  Maximum sterile barrier precautions: Hand hygiene;Cap;Mask;Sterile gown;Sterile gloves;Large sterile sheet  Site Prep: Chlorhexidine (preferred)  Local Anesthe...  Site Condition No complications  Blue Lumen Status Flushed;Heparin locked  Red Lumen Status Flushed;Heparin locked  Catheter fill solution Heparin 1000 units/ml  Catheter fill volume (Arterial) 2 cc  Catheter fill volume (Venous) 2  Dressing Type Transparent  Dressing Status Clean, Dry, Intact  Drainage Description None  Post treatment catheter status Capped and Clamped   Dialysis treatment 3hrs [place] to [employer] RN

## 2023-06-04 NOTE — TOC Progression Note (Addendum)
Transition of Care [facility]) - Progression Note    Patient Details  Name: Samantha Fletcher MRN: [id number] Date of Birth: 1946-04-13  Transition of Care [facility]) CM/SW Contact  Samantha Haven, RN Phone Number: 06/04/2023, 11:08 AM  Clinical Narrative:    NCM notified by CSW that patient declined SNF and wants to go home.  NCM offered HH services, she declined the [facility] services as well.  She states she lives with her son and her is there with her at all times per patient.  She has PCP and insurance on file.  She states her sister will transport her home at dc.  She currently does not have any HH services in place. She does have a walker and a cane at home.  Her son is her support sytem.  She gets her medications from [facility] on [address]  Patient refusing HH services at this time. NCM asked patient if it would be ok for this NCM to speak with her sister, Samantha Fletcher, she states yes.  NCM spoke with Samantha Fletcher she states patient son is disabled he is not able to care for patient at home. Patient will go home for 2 weeks and come right back to the hospital.  Samantha Fletcher states patient was in [facility] before and she did well there.  Samantha Fletcher would like for her to be able to go to [facility] again.  NCM informed her will try to talk to her again. Patient states her son cleans and cooks and she can take care of her self, she states she does not have the money to pay for SNF for copays. She states  that she does not want HH. NCM informed MD.   Expected Discharge Plan: Home/Self Care Barriers to Discharge: Continued Medical Work up  Expected Discharge Plan and Services In-house Referral: NA Discharge Planning Services: CM Consult Post Acute Care Choice: NA Living arrangements for the past 2 months: Single Family Home                 DME Arranged: N/A DME Agency: NA       HH Arranged: NA           Social Determinants of Health (SDOH) Interventions SDOH Screenings   Food Insecurity: No Food Insecurity (05/26/2023)   Housing: Low Risk  (05/26/2023)  Transportation Needs: No Transportation Needs (05/26/2023)  Utilities: Not At Risk (05/26/2023)  Tobacco Use: Medium Risk (05/28/2023)    Readmission Risk Interventions     No data to display

## 2023-06-05 DIAGNOSIS — D649 Anemia, unspecified: Secondary | ICD-10-CM | POA: Diagnosis not present

## 2023-06-05 DIAGNOSIS — D72829 Elevated white blood cell count, unspecified: Secondary | ICD-10-CM | POA: Diagnosis not present

## 2023-06-05 DIAGNOSIS — N170 Acute kidney failure with tubular necrosis: Secondary | ICD-10-CM | POA: Diagnosis not present

## 2023-06-05 DIAGNOSIS — J690 Pneumonitis due to inhalation of food and vomit: Secondary | ICD-10-CM | POA: Diagnosis not present

## 2023-06-05 LAB — CBC WITH DIFFERENTIAL/PLATELET
Abs Immature Granulocytes: 0.06 10*3/uL (ref 0.00–0.07)
Basophils Absolute: 0.1 10*3/uL (ref 0.0–0.1)
Basophils Relative: 1 %
Eosinophils Absolute: 0.3 10*3/uL (ref 0.0–0.5)
Eosinophils Relative: 3 %
HCT: 24.6 % — ABNORMAL LOW (ref 36.0–46.0)
Hemoglobin: 7.9 g/dL — ABNORMAL LOW (ref 12.0–15.0)
Immature Granulocytes: 1 %
Lymphocytes Relative: 10 %
Lymphs Abs: 1.1 10*3/uL (ref 0.7–4.0)
MCH: 27.5 pg (ref 26.0–34.0)
MCHC: 32.1 g/dL (ref 30.0–36.0)
MCV: 85.7 fL (ref 80.0–100.0)
Monocytes Absolute: 0.5 10*3/uL (ref 0.1–1.0)
Monocytes Relative: 5 %
Neutro Abs: 8.6 10*3/uL — ABNORMAL HIGH (ref 1.7–7.7)
Neutrophils Relative %: 80 %
Platelets: 424 10*3/uL — ABNORMAL HIGH (ref 150–400)
RBC: 2.87 MIL/uL — ABNORMAL LOW (ref 3.87–5.11)
RDW: 17.1 % — ABNORMAL HIGH (ref 11.5–15.5)
WBC: 10.6 10*3/uL — ABNORMAL HIGH (ref 4.0–10.5)
nRBC: 0 % (ref 0.0–0.2)

## 2023-06-05 LAB — RENAL FUNCTION PANEL
Albumin: 2.1 g/dL — ABNORMAL LOW (ref 3.5–5.0)
Anion gap: 14 (ref 5–15)
BUN: 19 mg/dL (ref 8–23)
CO2: 26 mmol/L (ref 22–32)
Calcium: 8 mg/dL — ABNORMAL LOW (ref 8.9–10.3)
Chloride: 96 mmol/L — ABNORMAL LOW (ref 98–111)
Creatinine, Ser: 3.27 mg/dL — ABNORMAL HIGH (ref 0.44–1.00)
GFR, Estimated: 14 mL/min — ABNORMAL LOW (ref >60)
Glucose, Bld: 114 mg/dL — ABNORMAL HIGH (ref 70–99)
Phosphorus: 3.2 mg/dL (ref 2.5–4.6)
Potassium: 3.7 mmol/L (ref 3.5–5.1)
Sodium: 136 mmol/L (ref 135–145)

## 2023-06-05 MED ORDER — SODIUM CHLORIDE 0.9 % IV SOLN
1.0000 g | Freq: Every day | INTRAVENOUS | Status: DC
  Administered 2023-06-05 – 2023-06-08 (×5): 1 g via INTRAVENOUS
  Filled 2023-06-05 (×5): qty 10

## 2023-06-05 MED ORDER — SODIUM CHLORIDE 0.9 % IV SOLN
500.0000 mg | Freq: Every day | INTRAVENOUS | Status: DC
  Administered 2023-06-05 (×2): 500 mg via INTRAVENOUS
  Filled 2023-06-05 (×2): qty 5

## 2023-06-05 MED ORDER — TUBERCULIN PPD 5 UNIT/0.1ML ID SOLN
5.0000 [IU] | Freq: Once | INTRADERMAL | Status: AC
  Administered 2023-06-05: 5 [IU] via INTRADERMAL
  Filled 2023-06-05: qty 0.1

## 2023-06-05 MED ORDER — PROMETHAZINE HCL 12.5 MG PO TABS
12.5000 mg | ORAL_TABLET | Freq: Once | ORAL | Status: AC
  Administered 2023-06-05: 12.5 mg via ORAL
  Filled 2023-06-05 (×2): qty 1

## 2023-06-05 NOTE — Progress Notes (Addendum)
Pt's referral for out-pt HD is still pending at this time. Case discussed with nephrologist this morning regarding clinic will likely require a TB skin test for admission to clinic. MD to order TB skin test today. Will assist as needed.   Olivia Canter Renal Navigator [phone]

## 2023-06-05 NOTE — Plan of Care (Signed)
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
## 2023-06-05 NOTE — Progress Notes (Addendum)
Patient ID: Samantha Fletcher, female   DOB: 15-Dec-1945, 77 y.o.   MRN: [id number] S: UOP only yesterday, UF 2L.  Tolerated HD fine, no new issues.  Doesn't want to talk this AM.    O:BP (!) 151/75 (BP Location: Left Arm)   Pulse 74   Temp 98.5 F (36.9 C) (Oral)   Resp 20   Ht 5\' 2"  (1.575 m)   Wt 57.3 kg   LMP  (LMP Unknown)   SpO2 98%   BMI 23.10 kg/m   Intake/Output Summary (Last 24 hours) at 06/05/2023 1101 Last data filed at 06/04/2023 2204 Gross per 24 hour  Intake 240 ml  Output 2000 ml  Net -1760 ml    Gen: NAD CVS: RRR Resp:dec BS bases, normal WOB at rest  Abd:+BS, soft, NT/nD  Ext: no edema RIJ TDC c/d/i  Recent Labs  Lab 05/31/23 0036 05/31/23 2200 06/01/23 0638 06/02/23 0100 06/03/23 0048 06/04/23 0054 06/05/23 0059  NA 130* 134* 133* 133* 132* 132* 136  K 4.6 4.2 4.3 4.2 4.2 4.8 3.7  CL 94* 93* 96* 96* 96* 96* 96*  CO2 26 26 26 26 25 24 26   GLUCOSE 106* 113* 87 105* 89 87 114*  BUN 36* 39* 21 30* 23 31* 19  CREATININE 5.20* 5.48* 3.95* 4.75* 3.83* 4.82* 3.27*  ALBUMIN 2.3*  2.3* 2.2* 2.3* 2.1* 2.1* 2.1* 2.1*  CALCIUM 7.5* 7.5* 7.7* 7.6* 7.7* 8.1* 8.0*  PHOS 5.1* 4.7* 4.0 5.2* 3.7 4.9* 3.2  AST 18  --   --   --   --   --   --   ALT 7  --   --   --   --   --   --    Liver Function Tests: Recent Labs  Lab 05/31/23 0036 05/31/23 2200 06/03/23 0048 06/04/23 0054 06/05/23 0059  AST 18  --   --   --   --   ALT 7  --   --   --   --   ALKPHOS 66  --   --   --   --   BILITOT 0.3  --   --   --   --   PROT 5.1*  --   --   --   --   ALBUMIN 2.3*  2.3*   < > 2.1* 2.1* 2.1*   < > = values in this interval not displayed.   No results for input(s): "LIPASE", "AMYLASE" in the last 168 hours.  No results for input(s): "AMMONIA" in the last 168 hours. CBC: Recent Labs  Lab 05/31/23 0036 05/31/23 2200 06/01/23 0638 06/02/23 0100 06/03/23 0048 06/04/23 0054 06/05/23 0059  WBC 11.1* 12.3*  --  10.1 10.7* 13.7* 10.6*  NEUTROABS 9.2*  --   --    --   --   --  8.6*  HGB 7.7* 7.5*   < > 8.1* 7.6* 8.0* 7.9*  HCT 23.1* 23.4*   < > 24.8* 24.0* 25.5* 24.6*  MCV 83.1 84.5  --  84.9 85.1 86.4 85.7  PLT 289 306  --  357 401* 431* 424*   < > = values in this interval not displayed.   Cardiac Enzymes: No results for input(s): "CKTOTAL", "CKMB", "CKMBINDEX", "TROPONINI" in the last 168 hours. CBG: No results for input(s): "GLUCAP" in the last 168 hours.  Iron Studies:  No results for input(s): "IRON", "TIBC", "TRANSFERRIN", "FERRITIN" in the last 72 hours.  Studies/Results: No results found.  amLODipine  10 mg Oral Daily   Chlorhexidine Gluconate Cloth  6 each Topical Q0600   heparin injection (subcutaneous)  5,000 Units Subcutaneous Q8H   hydrocortisone cream   Topical BID   hydrOXYzine  25 mg Oral BID   levothyroxine  75 mcg Oral Q0600   lidocaine  1 patch Transdermal Q24H   lidocaine  1 Application Urethral Daily   lidocaine-EPINEPHrine  10 mL Infiltration Once   melatonin  3 mg Oral QHS   pantoprazole  40 mg Oral Daily   polyethylene glycol  17 g Oral Daily   senna-docusate  2 tablet Oral BID   sodium zirconium cyclosilicate  10 g Oral Daily   tuberculin  5 Units Intradermal Once    BMET    Component Value Date/Time   NA 136 06/05/2023 0059   K 3.7 06/05/2023 0059   CL 96 (L) 06/05/2023 0059   CO2 26 06/05/2023 0059   GLUCOSE 114 (H) 06/05/2023 0059   BUN 19 06/05/2023 0059   CREATININE 3.27 (H) 06/05/2023 0059   CALCIUM 8.0 (L) 06/05/2023 0059   GFRNONAA 14 (L) 06/05/2023 0059   GFRAA >60 10/10/2016 0556   CBC    Component Value Date/Time   WBC 10.6 (H) 06/05/2023 0059   RBC 2.87 (L) 06/05/2023 0059   HGB 7.9 (L) 06/05/2023 0059   HCT 24.6 (L) 06/05/2023 0059   PLT 424 (H) 06/05/2023 0059   MCV 85.7 06/05/2023 0059   MCH 27.5 06/05/2023 0059   MCHC 32.1 06/05/2023 0059   RDW 17.1 (H) 06/05/2023 0059   LYMPHSABS 1.1 06/05/2023 0059   MONOABS 0.5 06/05/2023 0059   EOSABS 0.3 06/05/2023 0059   BASOSABS  0.1 06/05/2023 0059     Assessment/Plan:  AKI, oliguric - Scr was normal in May.  By history this is most consistent with ischemic ATN in setting of N/V for the past week with concomitant ARB therapy.  No evidence of cortical necrosis on renal US, no NSAID use, time course too short for AIN, no obstruction.  Negative ANA, dsDNA, ASO, ANCA, and normal complements.  She had TDC placed 05/28/23 by IR, s/p first HD session 05/29/23, 2nd 7/5, 3rd 7/7, last 7/8.  Remains oliguric  Continue daily labs and assessment for HD needs --> HD for now and started process for outpt AKI dialysis placement.  Placing PPD today for that purpose. Given normal baseline function still expect recovery.   Avoid nephrotoxic medications including NSAIDs and iodinated intravenous contrast exposure unless the latter is absolutely indicated.  Preferred narcotic agents for pain control are hydromorphone, fentanyl, and methadone. Morphine should not be used. Avoid Baclofen and avoid oral sodium phosphate and magnesium citrate based laxatives / bowel preps. Continue strict Input and Output monitoring. Will monitor the patient closely with you and intervene or adjust therapy as indicated by changes in clinical status/labs  Hyponatremia - mild and stable HTN- losartan on hold with AKI,  on amlodipine, BP fine. Low back pain - chronic Abdominal pain and persistent nausea - CT of abd/pelvis without source of symptoms and seems to be improved somewhat.  Per primary.  COPD - per primary svc; CXR this wek with persistent effusions and opacities ? Bronchitis.  UF with HD didn't help, doesn't appear pulm edema contributing. Normocytic anemia - TSAT 21%, dosed with IV iron and follow.  Transfused 1 unit PRBC 05/30/23 and 7-8s now.  Remain inpatient for now pending arrangement of outpt dialysis.   Estill Bakes MD [facility] Pager [phone]

## 2023-06-05 NOTE — Progress Notes (Signed)
OT Cancellation Note  Patient Details Name: Samantha Fletcher MRN: [id number] DOB: 08/22/1946   Cancelled Treatment:    Reason Eval/Treat Not Completed: Patient declined, no reason specified. Adamantly declined any activity with OT. Will discharge next visit if pt continues to refuse as per department policy.   Evern Bio 06/05/2023, 12:24 PM Berna Spare, OTR/L Acute Rehabilitation Services Office: [phone]

## 2023-06-05 NOTE — Progress Notes (Signed)
Hospital day#10 Subjective:   Summary: Samantha Fletcher with a pertinent past medical history of HTN, suspected COPD, paroxysmal atrial fibrillation, chronic pain, recurrent UTIs on hospital day 7 after being admitted for AKI likely secondary to ischemic ATN with oliguria s/p large volume of IVF therapy c/b pulmonary edema and pleural effusions currently undergoing HD. Fourth HD session 7/08. PFT normal 2015.   No overnight events reported. Continues to feel nauseated but was able to take bites and some drink during breakfast. Reports isn't able to urinate very well with pure wick. Baseline headache and abdominal upset present. Discussed the importance of participating in therapy.   Objective:  Vital signs in last 24 hours: Vitals:   06/04/23 2055 06/04/23 2344 06/05/23 0025 06/05/23 0405  BP: (!) 119/97 (!) 122/55 (!) 122/55 (!) 124/53  Pulse: 75     Resp: 18 (!) 21 19 19   Temp: 98.2 F (36.8 C) 98.2 F (36.8 C) 98.2 F (36.8 C) 98.4 F (36.9 C)  TempSrc: Oral Oral Oral Oral  SpO2: 99% 99%    Weight:    57.3 kg  Height:       Supplemental O2: Nasal Cannula SpO2: 99 % O2 Flow Rate (L/min): 2 L/min FiO2 (%): 28 % Filed Weights   06/04/23 0425 06/04/23 1650 06/05/23 0405  Weight: 60.9 kg 58.5 kg 57.3 kg   Renal Functional Panel:  Component Ref Range & Units 00:59 (06/05/23) 1 d ago (06/04/23) 2 d ago (06/03/23) 3 d ago (06/02/23) 4 d ago (06/01/23) 5 d ago (05/31/23) 5 d ago (05/31/23) 5 d ago (05/31/23)  Sodium 135 - 145 mmol/L 136 132 Low  132 Low  133 Low  133 Low  134 Low   130 Low   Potassium 3.5 - 5.1 mmol/L 3.7 4.8 4.2 4.2 4.3 4.2  4.6  Chloride 98 - 111 mmol/L 96 Low  96 Low  96 Low  96 Low  96 Low  93 Low   94 Low   CO2 22 - 32 mmol/L 26 24 25 26 26 26  26   Glucose, Bld 70 - 99 mg/dL 782 High  87 CM 89 CM 956 High  CM 87 CM 113 High  CM  106 High  CM  Comment: Glucose reference range applies only to samples taken after fasting for at least 8 hours.  BUN 8 - 23  mg/dL 19 31 High  23 30 High  21 39 High   36 High   Creatinine, Ser 0.44 - 1.00 mg/dL 2.13 High  0.86 High  5.78 High  4.75 High  3.95 High  5.48 High   5.20 High   Calcium 8.9 - 10.3 mg/dL 8.0 Low  8.1 Low  7.7 Low  7.6 Low  7.7 Low  7.5 Low   7.5 Low   Phosphorus 2.5 - 4.6 mg/dL 3.2 4.9 High  3.7 5.2 High  4.0 4.7 High   5.1 High   Albumin 3.5 - 5.0 g/dL 2.1 Low  2.1 Low  2.1 Low  2.1 Low  2.3 Low  2.2 Low  2.3 Low  2.3 Low   GFR, Estimated >60 mL/min 14 Low  9 Low  CM 12 Low  CM 9 Low  CM 11 Low  CM 8 Low  CM  8 Low  CM  Comment: (NOTE) Calculated using the CKD-EPI Creatinine Equation (2021)  Anion gap 5 - 15 14 12  CM 11 CM 11 CM 11 CM 15 CM  10 CM  Physical Exam:   Constitutional: Head of bed elevated while eating some breakfast. In no acute distress.  CV: RRR, no rubs, murmurs, or gallops; proximal/distal pulses intact.  Pulmonary: Crackles bilateral lower lobes, expiratory wheezing bilateral upper lobes.  Abdominal: Positive bowel sounds, tender to palpation across lower abdomen. Skin: Warm and dry.  Psych: Normal mood and affect.  Intake/Output Summary (Last 24 hours) at 06/05/2023 0659 Last data filed at 06/04/2023 2204 Gross per 24 hour  Intake 240 ml  Output 2200 ml  Net -1960 ml   Net IO Since Admission: 361.69 mL [06/05/23 0659]  Pertinent Labs:    Latest Ref Rng & Units 06/05/2023   12:59 AM 06/04/2023   12:54 AM 06/03/2023   12:48 AM  CBC  WBC 4.0 - 10.5 K/uL 10.6  13.7  10.7   Hemoglobin 12.0 - 15.0 g/dL 7.9  8.0  7.6   Hematocrit 36.0 - 46.0 % 24.6  25.5  24.0   Platelets 150 - 400 K/uL 424  431  401        Latest Ref Rng & Units 06/05/2023   12:59 AM 06/04/2023   12:54 AM 06/03/2023   12:48 AM  CMP  Glucose 70 - 99 mg/dL 244  87  89   BUN 8 - 23 mg/dL 19  31  23    Creatinine 0.44 - 1.00 mg/dL 0.10  2.72  5.36   Sodium 135 - 145 mmol/L 136  132  132   Potassium 3.5 - 5.1 mmol/L 3.7  4.8  4.2   Chloride 98 - 111 mmol/L 96  96  96   CO2 22 - 32  mmol/L 26  24  25    Calcium 8.9 - 10.3 mg/dL 8.0  8.1  7.7    Blood culture: in process  Imaging: N/a  Assessment/Plan:   Principal Problem:   Acute renal failure (ARF) (HCC) Active Problems:   Anxiety   Anemia, iron deficiency   Tobacco use disorder   Acute pulmonary edema (HCC)   Bilateral pleural effusion  AKI with oliguria Ischemic ATN Acute hypoxemic respiratory failure, improved Aspiration pneumonia/leukocytosis  Four HD sessions as of 7/10. Fevered 7/10, continued concern for aspiration pneumonia vs. Line infection.  On aspiration precautions. Declined SLP swallowing evaluation on 7/10 due to nausea. Also declined working with PT 7/10 due to nausea. Will continue to monitor vital signs, specifically respiratory status, electrolytes, and signs of renal recovery. Nephrology following, recommended trial with pure wick. Renal navigator arranging services for o/p HD. Nephrology placed orders including tuberculin test in preparation for outpatient dialysis. [facility] preference [facility]. Patient expressed preference for home health. Will attempt to be there at time of SLP eval or therapies to encourage patient to participate. Nausea continues intermittently.   Plan:  -PO Augmentin discontinued after 5 doses 7/10, changed to azithromycin and ceftriaxone -Monitor I/Os, especially with pure wick  -Supplemental O2 as needed -O2 paremeters  -Avoid nephrotoxins -Lokelma daily per nephrology -SLP consult for aspiration/modified diet pending; will sign-off if patient does not participate  -Phenergan 12.5 PRN for nausea    Normocytic anemia Hx iron deficiency anemia Stable hemoglobin today at 7.9.  No overt signs of bleeding per mouth or rectum.  -Minimize blood draws -Monitor for signs of bleeding -Continue to monitor  HTN Continues to be intermittently hypertensive. Holding home meds in setting of low normal, but given rising blood pressures will slowly restart home  medications and continue to monitor vitals. Blood pressures on the  lower side overnight to 7/11. Latest BP 124/53, HR 67.  Plan:  - Amlodipine 5 mg daily increased to 10 mg 7/10 by nephrology   Chronic back pain -Acetaminophen changed to PRN q6h -Dilaudid 1mg  PRN  Diet: Renal VTE: Heparin Code: DNR PT/OT recs: SNF for Subacute PT,  Dispo: Anticipated discharge to Home health in 2 days pending clinical improvement.   Marlowe Lawes Colbert Coyer, MD Internal Medicine Resident PGY-1 Please contact the on call pager after 5 pm and on weekends at [phone].

## 2023-06-06 DIAGNOSIS — Z515 Encounter for palliative care: Secondary | ICD-10-CM

## 2023-06-06 DIAGNOSIS — Z66 Do not resuscitate: Secondary | ICD-10-CM | POA: Diagnosis not present

## 2023-06-06 DIAGNOSIS — J690 Pneumonitis due to inhalation of food and vomit: Secondary | ICD-10-CM | POA: Diagnosis not present

## 2023-06-06 DIAGNOSIS — N170 Acute kidney failure with tubular necrosis: Secondary | ICD-10-CM | POA: Diagnosis not present

## 2023-06-06 DIAGNOSIS — N179 Acute kidney failure, unspecified: Secondary | ICD-10-CM

## 2023-06-06 DIAGNOSIS — D649 Anemia, unspecified: Secondary | ICD-10-CM | POA: Diagnosis not present

## 2023-06-06 DIAGNOSIS — D72829 Elevated white blood cell count, unspecified: Secondary | ICD-10-CM | POA: Diagnosis not present

## 2023-06-06 DIAGNOSIS — R531 Weakness: Secondary | ICD-10-CM

## 2023-06-06 DIAGNOSIS — Z789 Other specified health status: Secondary | ICD-10-CM

## 2023-06-06 LAB — RENAL FUNCTION PANEL
Albumin: 2 g/dL — ABNORMAL LOW (ref 3.5–5.0)
Anion gap: 14 (ref 5–15)
BUN: 29 mg/dL — ABNORMAL HIGH (ref 8–23)
CO2: 24 mmol/L (ref 22–32)
Calcium: 8 mg/dL — ABNORMAL LOW (ref 8.9–10.3)
Chloride: 97 mmol/L — ABNORMAL LOW (ref 98–111)
Creatinine, Ser: 4.64 mg/dL — ABNORMAL HIGH (ref 0.44–1.00)
GFR, Estimated: 9 mL/min — ABNORMAL LOW (ref >60)
Glucose, Bld: 87 mg/dL (ref 70–99)
Phosphorus: 4.6 mg/dL (ref 2.5–4.6)
Potassium: 3.9 mmol/L (ref 3.5–5.1)
Sodium: 135 mmol/L (ref 135–145)

## 2023-06-06 LAB — CBC WITH DIFFERENTIAL/PLATELET
Abs Immature Granulocytes: 0.02 10*3/uL (ref 0.00–0.07)
Basophils Absolute: 0.1 10*3/uL (ref 0.0–0.1)
Basophils Relative: 1 %
Eosinophils Absolute: 0.3 10*3/uL (ref 0.0–0.5)
Eosinophils Relative: 4 %
HCT: 23.8 % — ABNORMAL LOW (ref 36.0–46.0)
Hemoglobin: 7.5 g/dL — ABNORMAL LOW (ref 12.0–15.0)
Immature Granulocytes: 0 %
Lymphocytes Relative: 15 %
Lymphs Abs: 1.3 10*3/uL (ref 0.7–4.0)
MCH: 27.4 pg (ref 26.0–34.0)
MCHC: 31.5 g/dL (ref 30.0–36.0)
MCV: 86.9 fL (ref 80.0–100.0)
Monocytes Absolute: 0.6 10*3/uL (ref 0.1–1.0)
Monocytes Relative: 7 %
Neutro Abs: 6.3 10*3/uL (ref 1.7–7.7)
Neutrophils Relative %: 73 %
Platelets: 406 10*3/uL — ABNORMAL HIGH (ref 150–400)
RBC: 2.74 MIL/uL — ABNORMAL LOW (ref 3.87–5.11)
RDW: 17 % — ABNORMAL HIGH (ref 11.5–15.5)
WBC: 8.6 10*3/uL (ref 4.0–10.5)
nRBC: 0 % (ref 0.0–0.2)

## 2023-06-06 LAB — URINE CULTURE: Culture: 10000 — AB

## 2023-06-06 LAB — FERRITIN: Ferritin: 483 ng/mL — ABNORMAL HIGH (ref 11–307)

## 2023-06-06 MED ORDER — UMECLIDINIUM-VILANTEROL 62.5-25 MCG/ACT IN AEPB
1.0000 | INHALATION_SPRAY | Freq: Every day | RESPIRATORY_TRACT | Status: DC
  Administered 2023-06-09: 1 via RESPIRATORY_TRACT
  Filled 2023-06-06: qty 14

## 2023-06-06 MED ORDER — DARBEPOETIN ALFA 60 MCG/0.3ML IJ SOSY
60.0000 ug | PREFILLED_SYRINGE | INTRAMUSCULAR | Status: DC
  Filled 2023-06-06: qty 0.3

## 2023-06-06 MED ORDER — UMECLIDINIUM-VILANTEROL 62.5-25 MCG/ACT IN AEPB: 1.0000 | INHALATION_SPRAY | Freq: Every day | RESPIRATORY_TRACT | Status: DC

## 2023-06-06 MED ORDER — ALBUTEROL SULFATE (2.5 MG/3ML) 0.083% IN NEBU
2.5000 mg | INHALATION_SOLUTION | RESPIRATORY_TRACT | Status: DC
  Administered 2023-06-06 (×2): 2.5 mg via RESPIRATORY_TRACT
  Filled 2023-06-06 (×2): qty 3

## 2023-06-06 MED ORDER — PREDNISONE 20 MG PO TABS
40.0000 mg | ORAL_TABLET | Freq: Every day | ORAL | Status: DC
  Administered 2023-06-06 – 2023-06-09 (×4): 40 mg via ORAL
  Filled 2023-06-06 (×4): qty 2

## 2023-06-06 MED ORDER — AZITHROMYCIN 250 MG PO TABS
500.0000 mg | ORAL_TABLET | Freq: Every day | ORAL | Status: DC
  Administered 2023-06-06 – 2023-06-08 (×3): 500 mg via ORAL
  Filled 2023-06-06 (×3): qty 2

## 2023-06-06 MED ORDER — HEPARIN SODIUM (PORCINE) 1000 UNIT/ML IJ SOLN
INTRAMUSCULAR | Status: AC
  Administered 2023-06-06: 3200 [IU]
  Filled 2023-06-06: qty 4

## 2023-06-06 MED ORDER — ALBUTEROL SULFATE (2.5 MG/3ML) 0.083% IN NEBU
2.5000 mg | INHALATION_SOLUTION | Freq: Two times a day (BID) | RESPIRATORY_TRACT | Status: DC
  Administered 2023-06-07 – 2023-06-09 (×5): 2.5 mg via RESPIRATORY_TRACT
  Filled 2023-06-06 (×5): qty 3

## 2023-06-06 MED ORDER — PROMETHAZINE HCL 12.5 MG PO TABS
12.5000 mg | ORAL_TABLET | Freq: Once | ORAL | Status: AC
  Administered 2023-06-06: 12.5 mg via ORAL
  Filled 2023-06-06: qty 1

## 2023-06-06 MED ORDER — VENLAFAXINE HCL ER 37.5 MG PO CP24
37.5000 mg | ORAL_CAPSULE | Freq: Every day | ORAL | Status: DC
  Administered 2023-06-06 – 2023-06-09 (×4): 37.5 mg via ORAL
  Filled 2023-06-06 (×5): qty 1

## 2023-06-06 MED ORDER — MIRTAZAPINE 15 MG PO TABS
7.5000 mg | ORAL_TABLET | Freq: Every day | ORAL | Status: DC
  Administered 2023-06-06 – 2023-06-08 (×3): 7.5 mg via ORAL
  Filled 2023-06-06 (×3): qty 1

## 2023-06-06 NOTE — Progress Notes (Signed)
Approximately [phone]-- Pt left for HD this AM with transporter. Per night shift RN, report provided early this AM to HD. VS obtained prior to pt leaving for HD. Pt A&O x 4, drowsy but arousable. Telemetry notified. RN to assess pt and administer overdue medications upon return to unit.

## 2023-06-06 NOTE — Progress Notes (Signed)
New Dialysis Start    Patient identified as new dialysis start (AKI). Kidney Education packet assembled and given. I attempted to discuss the following items below with patient. Patient acknowledged me but was not interested in discussing anything with me. Patient kept her eyes closed for the entire visit. Patient told me to just leave the packet by her bedside and she will go over it on her own.    Current medications and possible changes once started:  Discussed that patient's medications may change over time.  Ex; hypertension medications and diabetes medication.  Nephrologists will adjust as needed.   Fluid restrictions reviewed:  32 oz daily goal:  All liquids count; soups, ice, jello, fruits. Will also refer dietitian.   Phosphorus and potassium: Handout given showing high potassium and phosphorus foods.  Alternative food and drink options given. Will also refer dietitian.   Family support:  None at bedside.   Outpatient Clinic Resources:  Discussed roles of Outpatient clinic staff and advised to make a list of needs, if any, to talk with outpatient staff if needed.   Care plan schedule: Informed patient of Care Plans in outpatient setting and to participate in the care plan.  An invitation would be given from outpatient clinic.    Dialysis Access Options:  Reviewed access options with patients. Discussed in detail about care at home with new AVG & AVF. Reviewed checking bruit and thrill. If dialysis catheter present, educated that patient could not take showers.  Catheter dressing changes were to be done by outpatient clinic staff only   Home therapy options:  Educated patient about home therapy options:  PD vs home hemo.     Patient did not engage in conversation with me during visit. Will continue to round on patient during admission as needed.  Informed patient that my contact information was inside the packet provided if and when any questions or concerns arise.   Jean Rosenthal Dialysis Nurse Coordinator [phone]

## 2023-06-06 NOTE — Progress Notes (Signed)
Summary:  Samantha Fletcher with a pertinent past medical history of HTN, suspected COPD, paroxysmal atrial fibrillation, chronic pain, recurrent UTIs on hospital day 7 after being admitted for AKI likely secondary to ischemic ATN with oliguria s/p large volume of IVF therapy c/b pulmonary edema and pleural effusions currently undergoing HD. Fourth HD session 7/08. PFT normal 2015.   Subjective:  Nausea, decreased appetite, continues to have difficulty breathing, saturating well on 2L O2. Denies chest pain, vomiting, or diarrhea. Discussed importance of sitting up in chair and participating with therapy. Patient with eyes closed and requiring a lot of prompting to speak (alert, just declining to participate in conversation).   Objective:  Vital signs in last 24 hours: Vitals:   06/06/23 0930 06/06/23 1000 06/06/23 1030 06/06/23 1100  BP: (!) 157/73 (!) 164/72 (!) 153/65 (!) 167/72  Pulse: 72 66 66 65  Resp: (!) 22 20 17  (!) 24  Temp:      TempSrc:      SpO2: 100% 100% 100% 100%  Weight:      Height:          Latest Ref Rng & Units 06/06/2023   12:56 AM 06/05/2023   12:59 AM 06/04/2023   12:54 AM  CBC  WBC 4.0 - 10.5 K/uL 8.6  10.6  13.7   Hemoglobin 12.0 - 15.0 g/dL 7.5  7.9  8.0   Hematocrit 36.0 - 46.0 % 23.8  24.6  25.5   Platelets 150 - 400 K/uL 406  424  431     Renal Functional Panel:  Component Ref Range & Units 00:56 (06/06/23) 1 d ago (06/05/23) 2 d ago (06/04/23) 3 d ago (06/03/23) 4 d ago (06/02/23) 5 d ago (06/01/23) 6 d ago (05/31/23)  Sodium 135 - 145 mmol/L 135 136 132 Low  132 Low  133 Low  133 Low  134 Low   Potassium 3.5 - 5.1 mmol/L 3.9 3.7 4.8 4.2 4.2 4.3 4.2  Chloride 98 - 111 mmol/L 97 Low  96 Low  96 Low  96 Low  96 Low  96 Low  93 Low   CO2 22 - 32 mmol/L 24 26 24 25 26 26 26   Glucose, Bld 70 - 99 mg/dL 87 161 High  CM 87 CM 89 CM 105 High  CM 87 CM 113 High  CM  Comment: Glucose reference range applies only to samples taken after fasting for at least 8  hours.  BUN 8 - 23 mg/dL 29 High  19 31 High  23 30 High  21 39 High   Creatinine, Ser 0.44 - 1.00 mg/dL 0.96 High  0.45 High  4.09 High  3.83 High  4.75 High  3.95 High  5.48 High   Calcium 8.9 - 10.3 mg/dL 8.0 Low  8.0 Low  8.1 Low  7.7 Low  7.6 Low  7.7 Low  7.5 Low   Phosphorus 2.5 - 4.6 mg/dL 4.6 3.2 4.9 High  3.7 5.2 High  4.0 4.7 High   Albumin 3.5 - 5.0 g/dL 2.0 Low  2.1 Low  2.1 Low  2.1 Low  2.1 Low  2.3 Low  2.2 Low   GFR, Estimated >60 mL/min 9 Low  14 Low  CM 9 Low  CM 12 Low  CM 9 Low  CM 11 Low  CM 8 Low  CM   Ferritin 483  I/O: - 300 ccs urine, total net input +1,020 ccs  Physical exam: Constitutional: Head of bed elevated, in  bed eyes closed. In no acute distress.  CV: RRR, no rubs, murmurs, or gallops; proximal/distal pulses intact.  Pulmonary: Crackles bilateral lower lobes, expiratory wheezing bilateral upper lobes.  Abdominal: Positive bowel sounds, tender to palpation across lower abdomen. Skin: Warm and dry.  Psych: Flat affect, unwilling to participate in conversation.   Assessment/Plan:  Principal Problem:   Acute renal failure (ARF) (HCC) Active Problems:   Anxiety   Anemia, iron deficiency   Tobacco use disorder   Acute pulmonary edema (HCC)   Bilateral pleural effusion  AKI with oliguria Ischemic ATN Acute hypoxemic respiratory failure, improved Aspiration pneumonia/leukocytosis  Five HD sessions as of 7/12 (last 7/10). Fevered 7/10, continued concern for aspiration pneumonia vs. line infection. Blood culture in process. Microscopy report mentioned E. Avium but this was collected from a foley bag days after admission and is likely a contaminant. On aspiration precautions, SLP made recommendations to minimize aspiration risk. Has been declining PT/SLP, sometimes due to nausea and sometimes because the patient says she does not feel well. Has not sat up in chair today and has minimal mobilization.   Will continue to monitor vital signs,  specifically respiratory status, electrolytes, and signs of renal recovery. Trial with pure wick with output ~300ccs; patient reporting continued discomfort with urination (unchanged throughout stay). Post-void bladder scan ~90ccs. Renal navigator arranging services for o/p HD, still has not confirmed services at [facility]. Per SW, patient declined HH.   Plan:  -PO Augmentin discontinued after 5 doses 7/10, changed to azithromycin and ceftriaxone with first dose 7/11. Will change azithromycin to PO azithromycin 500 mg today, continue with IV ceftriaxone. End date for azithromycin and ceftriaxone 7/13.  -Monitor I/Os, especially with pure wick  -Supplemental O2 as needed -Avoid nephrotoxins -Holding lokelma, K 3.9 this morning, HD session today  - Aspiration precautions -Phenergan 12.5 PRN for nausea    Normocytic anemia Hx iron deficiency anemia Hemoglobin today at 7.5. MCV 86.9, ferritin 483. No overt signs of bleeding per mouth or rectum.  -Minimize blood draws -Monitor for signs of bleeding -Continue to monitor   HTN Continues to be intermittently hypertensive. Holding home meds in setting of low normal, but given rising blood pressures will slowly restart home medications and continue to monitor vitals. Blood pressures on the lower side overnight to 7/11. Latest BP 165/75 (in HD), HR 70.  Plan:  - Amlodipine 10 mg (since 7/10 by nephrology)    Chronic back pain -Acetaminophen changed to PRN q6h -Dilaudid 1mg  PRN   Diet: Renal VTE: Heparin Code: DNR PT/OT recs: Patient declined HH Dispo: Anticipated discharge to Home health in 1 days pending clinical improvement.    Philomena Doheny, MD, PGY-1 06/06/2023, 11:29 AM Pager: @MYPAGER @ After 5pm on weekdays and 1pm on weekends: On Call pager [phone]

## 2023-06-06 NOTE — Plan of Care (Signed)

## 2023-06-06 NOTE — Progress Notes (Addendum)
Contacted [facility] admissions this morning to request an update on pt's referral. Navigator advised referral is still pending at this time. Will assist as needed.   Olivia Canter Renal Navigator [phone]  Addendum at 3:07 pm: [facility] admissions to request an update on pt's financial clearance. Also contacted [facility] staff earlier today regarding sister's concerns about transportation to/from out-pt HD appts at d/c. Sister requesting any possible resources/options and TOC staff made aware of sister's request.   Addendum at 4:39 pm: Pt has been accepted at [facility] in [facility] on TTS 10:15 chair time. Pt can start on Tuesday and will need to arrive at 9:30 to complete paperwork prior to treatment. Met with pt at bedside to discuss arrangements. Pt kept eyes closed the entire time. Pt did acknowledge navigator but did not participate in conversation. Schedule letter left at pt's bedside. Pt advised that navigator called pt's sister to discuss arrangements as well. Spoke to sister via phone. Discussed arrangements with sister and sent text with clinic details and schedule/time. Sister agreeable to plan. Update provided to attending, nephrologist, pt's RN, CSW,and RN CM. Arrangements added to AVS as well in case pt stable for d/c this weekend. Attempted to reach clinic via phone and email. Messages left.

## 2023-06-06 NOTE — Progress Notes (Signed)
Physical Therapy Treatment Patient Details Name: Samantha Fletcher MRN: [id number] DOB: Nov 28, 1945 Today's Date: 06/06/2023   History of Present Illness 77 yo female admitted 05/25/23 with AKI, UTI, decreased PT intact with N&V.  She has been anuric now with foley.  Elevated troponins as well with concern for NSTEMI.  PMHx:  anxiety, depression, memory changes, COPD, CKD3a, hypothyroidism, HTN, chronic pain, PAF, hyponatremia, CVA, weakness, fibromyalgia, OSA not on CPAP and vertigo.    PT Comments  Pt admitted with above diagnosis. Pt was able to ambulate with RW around bed needing mod assist and cues. Pt with poor safety awareness and needed incr encouragement to participate.  Will continue to follow acutely. Pt currently with functional limitations due to the deficits listed below (see PT Problem List). Pt will benefit from acute skilled PT to increase their independence and safety with mobility to allow discharge.        Assistance Recommended at Discharge Frequent or constant Supervision/Assistance  If plan is discharge home, recommend the following:  Can travel by private vehicle    A lot of help with walking and/or transfers;A lot of help with bathing/dressing/bathroom;Direct supervision/assist for medications management;Direct supervision/assist for financial management;Assist for transportation;Assistance with cooking/housework   No  Equipment Recommendations  None recommended by PT    Recommendations for Other Services       Precautions / Restrictions Precautions Precautions: Fall Precaution Comments: hx of multiple falls Restrictions Weight Bearing Restrictions: No     Mobility  Bed Mobility Overal bed mobility: Needs Assistance Bed Mobility: Sit to Supine       Sit to supine: Min assist   General bed mobility comments: Assist needed for LEs back into bed    Transfers Overall transfer level: Needs assistance Equipment used: Rolling walker (2 wheels) Transfers:  Sit to/from Stand Sit to Stand: Min assist           General transfer comment: On arrival, sats 87% with pt having removed her O2.  Min assist to stand from chair.    Ambulation/Gait Ambulation/Gait assistance: Mod assist Gait Distance (Feet): 15 Feet Assistive device: Rolling walker (2 wheels) Gait Pattern/deviations: Step-to pattern, Decreased step length - right, Decreased step length - left, Decreased stride length, Decreased dorsiflexion - left, Decreased dorsiflexion - right, Antalgic, Staggering right, Staggering left, Drifts right/left, Trunk flexed, Wide base of support   Gait velocity interpretation: <1.31 ft/sec, indicative of household ambulator   General Gait Details: Pt had difficulty standing upright even with RW use. Pt flexed and needed cues to stand upright. Pt with poor coordination of bil LEs, right poorer than left.  Pt needed incr time to ambulate and incr assist for safety. Pt needed cues to sequence steps and RW. Pt tried to sit on bed too soon and needed help to guide buttocks to bed.   Stairs             Wheelchair Mobility     Tilt Bed    Modified Rankin (Stroke Patients Only)       Balance Overall balance assessment: History of Falls                                          Cognition Arousal/Alertness: Awake/alert Behavior During Therapy: WFL for tasks assessed/performed Overall Cognitive Status: No family/caregiver present to determine baseline cognitive functioning  General Comments: pt with hx of dementia, poor memory and awareness of deficits        Exercises General Exercises - Lower Extremity Ankle Circles/Pumps: AROM, Both, 10 reps, Supine Heel Slides: AROM, Both, 10 reps, Supine    General Comments General comments (skin integrity, edema, etc.): Replaced O2 at 2L and RT came in to give breathing treatment and sats improved from 85% after walk.       Pertinent Vitals/Pain Pain Assessment Pain Assessment: Faces Faces Pain Scale: Hurts even more Pain Location: back Pain Descriptors / Indicators: Discomfort, Grimacing, Guarding Pain Intervention(s): Limited activity within patient's tolerance, Monitored during session, Repositioned    Home Living                          Prior Function            PT Goals (current goals can now be found in the care plan section) Acute Rehab PT Goals Patient Stated Goal: per sister to get to rehab prior to home Progress towards PT goals: Progressing toward goals    Frequency    Min 1X/week      PT Plan Current plan remains appropriate    Co-evaluation              AM-PAC PT "6 Clicks" Mobility   Outcome Measure  Help needed turning from your back to your side while in a flat bed without using bedrails?: Total Help needed moving from lying on your back to sitting on the side of a flat bed without using bedrails?: Total Help needed moving to and from a bed to a chair (including a wheelchair)?: Total Help needed standing up from a chair using your arms (e.g., wheelchair or bedside chair)?: Total Help needed to walk in hospital room?: Total Help needed climbing 3-5 steps with a railing? : Total 6 Click Score: 6    End of Session Equipment Utilized During Treatment: Gait belt;Oxygen Activity Tolerance: Patient limited by fatigue Patient left: in bed;with call bell/phone within reach;with bed alarm set;with family/visitor present (unable to set bed alarm as bed plug is broken and alarm will not set on battery power.) Nurse Communication: Mobility status PT Visit Diagnosis: Repeated falls (R29.6);Muscle weakness (generalized) (M62.81);Dizziness and giddiness (R42);Other abnormalities of gait and mobility (R26.89)     Time: 1430-1451 PT Time Calculation (min) (ACUTE ONLY): 21 min  Charges:    $Gait Training: 8-22 mins PT General Charges $$ ACUTE PT VISIT: 1  Visit                     Samantha Fletcher M,PT Acute Rehab Services [phone]    Samantha Fletcher 06/06/2023, 4:57 PM

## 2023-06-06 NOTE — Progress Notes (Signed)
PT Cancellation Note  Patient Details Name: Samantha Fletcher MRN: [id number] DOB: Feb 01, 1946   Cancelled Treatment:    Reason Eval/Treat Not Completed: Patient at procedure or test/unavailable (Pt in HD currently. Will reattempt as able.)   [name] 06/06/2023, 11:24 AM Taurus Alamo M,PT Acute Rehab Services [phone]

## 2023-06-06 NOTE — Progress Notes (Signed)
   06/06/23 1200  Vitals  Temp 98.3 F (36.8 C)  Pulse Rate 70  Resp 18  BP (!) 154/78  SpO2 100 %  Weight 51 kg  Oxygen Therapy  O2 Flow Rate (L/min) 2 L/min  Pulse Oximetry Type Continuous  Oximetry Probe Site Changed No  Patient Activity (if Appropriate) In bed  Post Treatment  Dialyzer Clearance Lightly streaked  Duration of HD Treatment -hour(s) 3 hour(s)  Hemodialysis Intake (mL) 0 mL  Liters Processed 63  Fluid Removed (mL) 1500 mL  Tolerated HD Treatment Yes   Received patient in bed to unit.  Alert and oriented.  Informed consent signed and in chart.   TX duration:3.0  Patient tolerated well.  Transported back to the room  Alert, without acute distress.  Hand-off given to patient's nurse.   Access used: [facility] Access issues: no complications  Total UF removed: 1500 Medication(s) none [facility]
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
## 2023-06-06 NOTE — Progress Notes (Signed)
Patient ID: MARLENIE MALAS, female   DOB: October 12, 1946, 77 y.o.   MRN: [id number] S: UOP only yesterday.     Seen at HD today - tolerating fine.  Still has various complaints - dyspnea, nausea but doesn't give many details.     O:BP (!) 157/73   Pulse 72   Temp 98 F (36.7 C)   Resp (!) 22   Ht 5\' 2"  (1.575 m)   Wt 57.3 kg   LMP  (LMP Unknown)   SpO2 100%   BMI 23.10 kg/m   Intake/Output Summary (Last 24 hours) at 06/06/2023 0947 Last data filed at 06/06/2023 5621 Gross per 24 hour  Intake 1080 ml  Output 300 ml  Net 780 ml    Gen: NAD CVS: RRR Resp:dec BS bases, normal WOB at rest  Abd:+BS, soft, NT/nD  Ext: no edema RIJ TDC c/d/i  Recent Labs  Lab 05/31/23 0036 05/31/23 2200 06/01/23 0638 06/02/23 0100 06/03/23 0048 06/04/23 0054 06/05/23 0059 06/06/23 0056  NA 130* 134* 133* 133* 132* 132* 136 135  K 4.6 4.2 4.3 4.2 4.2 4.8 3.7 3.9  CL 94* 93* 96* 96* 96* 96* 96* 97*  CO2 26 26 26 26 25 24 26 24   GLUCOSE 106* 113* 87 105* 89 87 114* 87  BUN 36* 39* 21 30* 23 31* 19 29*  CREATININE 5.20* 5.48* 3.95* 4.75* 3.83* 4.82* 3.27* 4.64*  ALBUMIN 2.3*  2.3* 2.2* 2.3* 2.1* 2.1* 2.1* 2.1* 2.0*  CALCIUM 7.5* 7.5* 7.7* 7.6* 7.7* 8.1* 8.0* 8.0*  PHOS 5.1* 4.7* 4.0 5.2* 3.7 4.9* 3.2 4.6  AST 18  --   --   --   --   --   --   --   ALT 7  --   --   --   --   --   --   --    Liver Function Tests: Recent Labs  Lab 05/31/23 0036 05/31/23 2200 06/04/23 0054 06/05/23 0059 06/06/23 0056  AST 18  --   --   --   --   ALT 7  --   --   --   --   ALKPHOS 66  --   --   --   --   BILITOT 0.3  --   --   --   --   PROT 5.1*  --   --   --   --   ALBUMIN 2.3*  2.3*   < > 2.1* 2.1* 2.0*   < > = values in this interval not displayed.   No results for input(s): "LIPASE", "AMYLASE" in the last 168 hours.  No results for input(s): "AMMONIA" in the last 168 hours. CBC: Recent Labs  Lab 05/31/23 0036 05/31/23 2200 06/02/23 0100 06/03/23 0048 06/04/23 0054 06/05/23 0059  06/06/23 0056  WBC 11.1*   < > 10.1 10.7* 13.7* 10.6* 8.6  NEUTROABS 9.2*  --   --   --   --  8.6* 6.3  HGB 7.7*   < > 8.1* 7.6* 8.0* 7.9* 7.5*  HCT 23.1*   < > 24.8* 24.0* 25.5* 24.6* 23.8*  MCV 83.1   < > 84.9 85.1 86.4 85.7 86.9  PLT 289   < > 357 401* 431* 424* 406*   < > = values in this interval not displayed.   Cardiac Enzymes: No results for input(s): "CKTOTAL", "CKMB", "CKMBINDEX", "TROPONINI" in the last 168 hours. CBG: No results for input(s): "GLUCAP" in the last 168 hours.  Iron Studies:  Recent Labs    06/06/23 0056  FERRITIN 483*    Studies/Results: No results found.  albuterol  2.5 mg Nebulization Q4H   amLODipine  10 mg Oral Daily   azithromycin  500 mg Oral Daily   Chlorhexidine Gluconate Cloth  6 each Topical Q0600   heparin injection (subcutaneous)  5,000 Units Subcutaneous Q8H   heparin sodium (porcine)       hydrocortisone cream   Topical BID   hydrOXYzine  25 mg Oral BID   levothyroxine  75 mcg Oral Q0600   lidocaine  1 patch Transdermal Q24H   lidocaine  1 Application Urethral Daily   lidocaine-EPINEPHrine  10 mL Infiltration Once   melatonin  3 mg Oral QHS   pantoprazole  40 mg Oral Daily   polyethylene glycol  17 g Oral Daily   predniSONE  40 mg Oral Q breakfast   senna-docusate  2 tablet Oral BID   sodium zirconium cyclosilicate  10 g Oral Daily   tuberculin  5 Units Intradermal Once   umeclidinium-vilanterol  1 puff Inhalation Daily    BMET    Component Value Date/Time   NA 135 06/06/2023 0056   K 3.9 06/06/2023 0056   CL 97 (L) 06/06/2023 0056   CO2 24 06/06/2023 0056   GLUCOSE 87 06/06/2023 0056   BUN 29 (H) 06/06/2023 0056   CREATININE 4.64 (H) 06/06/2023 0056   CALCIUM 8.0 (L) 06/06/2023 0056   GFRNONAA 9 (L) 06/06/2023 0056   GFRAA >60 10/10/2016 0556   CBC    Component Value Date/Time   WBC 8.6 06/06/2023 0056   RBC 2.74 (L) 06/06/2023 0056   HGB 7.5 (L) 06/06/2023 0056   HCT 23.8 (L) 06/06/2023 0056   PLT 406 (H)  06/06/2023 0056   MCV 86.9 06/06/2023 0056   MCH 27.4 06/06/2023 0056   MCHC 31.5 06/06/2023 0056   RDW 17.0 (H) 06/06/2023 0056   LYMPHSABS 1.3 06/06/2023 0056   MONOABS 0.6 06/06/2023 0056   EOSABS 0.3 06/06/2023 0056   BASOSABS 0.1 06/06/2023 0056     Assessment/Plan:  AKI, oliguric - Scr was normal in May.  By history this is most consistent with ischemic ATN in setting of N/V for the past week with concomitant ARB therapy.  No evidence of cortical necrosis on renal US, no NSAID use, time course too short for AIN, no obstruction.  Negative ANA, dsDNA, ASO, ANCA, and normal complements.  She had TDC placed 05/28/23 by IR, s/p first HD session 05/29/23, 2nd 7/5, 3rd 7/7, last 7/8.  Remains oliguric  Continue daily labs and assessment for HD needs --> HD for now and started process for outpt AKI dialysis placement.  Placed PPD 7/11 for that purpose. Given normal baseline function still expect recovery.   Avoid nephrotoxic medications including NSAIDs and iodinated intravenous contrast exposure unless the latter is absolutely indicated.  Preferred narcotic agents for pain control are hydromorphone, fentanyl, and methadone. Morphine should not be used. Avoid Baclofen and avoid oral sodium phosphate and magnesium citrate based laxatives / bowel preps. Continue strict Input and Output monitoring. Will monitor the patient closely with you and intervene or adjust therapy as indicated by changes in clinical status/labs  HTN- losartan on hold with AKI,  on amlodipine, UF with HD.  Low back pain - chronic Abdominal pain and persistent nausea - CT of abd/pelvis without source of symptoms and seems to be improved somewhat.  Per primary.  COPD - per primary svc;  CXR this wek with persistent effusions and opacities ? Bronchitis.  UF with HD didn't help, doesn't appear pulm edema contributing. Normocytic anemia - TSAT 21%, dosed with IV iron and follow.  Transfused 1 unit PRBC 05/30/23 and 7-8s now. Start  ESA.  Remain inpatient for now pending arrangement of outpt dialysis.   Estill Bakes MD [facility] Pager [phone]

## 2023-06-06 NOTE — Plan of Care (Signed)
  Problem: Clinical Measurements: Goal: Will remain free from infection Outcome: Progressing   Problem: Pain Managment: Goal: General experience of comfort will improve Outcome: Progressing   Problem: Skin Integrity: Goal: Risk for impaired skin integrity will decrease Outcome: Progressing   Problem: Clinical Measurements: Goal: Ability to maintain clinical measurements within normal limits will improve Outcome: Progressing

## 2023-06-06 NOTE — Progress Notes (Signed)
Pt refusing CHG bath this shift. Pt states she "doesn't feel like it" at this time. When asked if she would like her CHG bath later this evening, pt stated "maybe." Education provided by this RN on the importance of daily CHG baths. RN to pass information along to nightshift RN and NT. Charge RN, Dianna, notified.

## 2023-06-07 DIAGNOSIS — Z7189 Other specified counseling: Secondary | ICD-10-CM | POA: Diagnosis not present

## 2023-06-07 DIAGNOSIS — J810 Acute pulmonary edema: Secondary | ICD-10-CM

## 2023-06-07 DIAGNOSIS — N179 Acute kidney failure, unspecified: Secondary | ICD-10-CM | POA: Diagnosis not present

## 2023-06-07 DIAGNOSIS — F419 Anxiety disorder, unspecified: Secondary | ICD-10-CM

## 2023-06-07 DIAGNOSIS — F329 Major depressive disorder, single episode, unspecified: Secondary | ICD-10-CM

## 2023-06-07 DIAGNOSIS — J90 Pleural effusion, not elsewhere classified: Secondary | ICD-10-CM | POA: Diagnosis not present

## 2023-06-07 LAB — RENAL FUNCTION PANEL
Albumin: 2.2 g/dL — ABNORMAL LOW (ref 3.5–5.0)
Anion gap: 12 (ref 5–15)
BUN: 18 mg/dL (ref 8–23)
CO2: 26 mmol/L (ref 22–32)
Calcium: 8.4 mg/dL — ABNORMAL LOW (ref 8.9–10.3)
Chloride: 94 mmol/L — ABNORMAL LOW (ref 98–111)
Creatinine, Ser: 3.15 mg/dL — ABNORMAL HIGH (ref 0.44–1.00)
GFR, Estimated: 15 mL/min — ABNORMAL LOW (ref >60)
Glucose, Bld: 131 mg/dL — ABNORMAL HIGH (ref 70–99)
Phosphorus: 3.6 mg/dL (ref 2.5–4.6)
Potassium: 4.2 mmol/L (ref 3.5–5.1)
Sodium: 132 mmol/L — ABNORMAL LOW (ref 135–145)

## 2023-06-07 LAB — CBC
HCT: 25.5 % — ABNORMAL LOW (ref 36.0–46.0)
Hemoglobin: 8 g/dL — ABNORMAL LOW (ref 12.0–15.0)
MCH: 27.4 pg (ref 26.0–34.0)
MCHC: 31.4 g/dL (ref 30.0–36.0)
MCV: 87.3 fL (ref 80.0–100.0)
Platelets: 437 10*3/uL — ABNORMAL HIGH (ref 150–400)
RBC: 2.92 MIL/uL — ABNORMAL LOW (ref 3.87–5.11)
RDW: 16.4 % — ABNORMAL HIGH (ref 11.5–15.5)
WBC: 5.2 10*3/uL (ref 4.0–10.5)
nRBC: 0 % (ref 0.0–0.2)

## 2023-06-07 NOTE — Progress Notes (Signed)
Patient ID: Samantha Fletcher, female   DOB: [birth year], 77 y.o.   MRN: [id number] S: UOP only doc yesterday but she says more.   Had HD yest tolerated fine.  Seems more energetic this AM and denies new complaints.   O:BP 121/68 (BP Location: Left Arm)   Pulse 67   Temp 98.4 F (36.9 C) (Oral)   Resp 17   Ht 5\' 2"  (1.575 m)   Wt 51 kg   LMP  (LMP Unknown)   SpO2 98%   BMI 20.56 kg/m   Intake/Output Summary (Last 24 hours) at 06/07/2023 1035 Last data filed at 06/07/2023 5284 Gross per 24 hour  Intake 997.33 ml  Output 1650 ml  Net -652.67 ml    Gen: NAD CVS: RRR Resp:dec BS bases, normal WOB at rest  Abd:+BS, soft, NT/nD  Ext: no edema RIJ TDC c/d/i  Recent Labs  Lab 06/01/23 0638 06/02/23 0100 06/03/23 0048 06/04/23 0054 06/05/23 0059 06/06/23 0056 06/07/23 0051  NA 133* 133* 132* 132* 136 135 132*  K 4.3 4.2 4.2 4.8 3.7 3.9 4.2  CL 96* 96* 96* 96* 96* 97* 94*  CO2 26 26 25 24 26 24 26   GLUCOSE 87 105* 89 87 114* 87 131*  BUN 21 30* 23 31* 19 29* 18  CREATININE 3.95* 4.75* 3.83* 4.82* 3.27* 4.64* 3.15*  ALBUMIN 2.3* 2.1* 2.1* 2.1* 2.1* 2.0* 2.2*  CALCIUM 7.7* 7.6* 7.7* 8.1* 8.0* 8.0* 8.4*  PHOS 4.0 5.2* 3.7 4.9* 3.2 4.6 3.6   Liver Function Tests: Recent Labs  Lab 06/05/23 0059 06/06/23 0056 06/07/23 0051  ALBUMIN 2.1* 2.0* 2.2*   No results for input(s): "LIPASE", "AMYLASE" in the last 168 hours.  No results for input(s): "AMMONIA" in the last 168 hours. CBC: Recent Labs  Lab 06/03/23 0048 06/04/23 0054 06/05/23 0059 06/06/23 0056 06/07/23 0051  WBC 10.7* 13.7* 10.6* 8.6 5.2  NEUTROABS  --   --  8.6* 6.3  --   HGB 7.6* 8.0* 7.9* 7.5* 8.0*  HCT 24.0* 25.5* 24.6* 23.8* 25.5*  MCV 85.1 86.4 85.7 86.9 87.3  PLT 401* 431* 424* 406* 437*   Cardiac Enzymes: No results for input(s): "CKTOTAL", "CKMB", "CKMBINDEX", "TROPONINI" in the last 168 hours. CBG: No results for input(s): "GLUCAP" in the last 168 hours.  Iron Studies:  Recent Labs     06/06/23 0056  FERRITIN 483*    Studies/Results: No results found.  albuterol  2.5 mg Nebulization BID   amLODipine  10 mg Oral Daily   azithromycin  500 mg Oral Daily   Chlorhexidine Gluconate Cloth  6 each Topical Q0600   [START ON 06/09/2023] darbepoetin (ARANESP) injection - DIALYSIS  60 mcg Subcutaneous Q Mon-1800   heparin injection (subcutaneous)  5,000 Units Subcutaneous Q8H   hydrocortisone cream   Topical BID   hydrOXYzine  25 mg Oral BID   levothyroxine  75 mcg Oral Q0600   lidocaine  1 patch Transdermal Q24H   lidocaine  1 Application Urethral Daily   lidocaine-EPINEPHrine  10 mL Infiltration Once   melatonin  3 mg Oral QHS   mirtazapine  7.5 mg Oral QHS   pantoprazole  40 mg Oral Daily   polyethylene glycol  17 g Oral Daily   predniSONE  40 mg Oral Q breakfast   senna-docusate  2 tablet Oral BID   sodium zirconium cyclosilicate  10 g Oral Daily   tuberculin  5 Units Intradermal Once   umeclidinium-vilanterol  1 puff Inhalation Daily  venlafaxine XR  37.5 mg Oral Q breakfast    BMET    Component Value Date/Time   NA 132 (L) 06/07/2023 0051   K 4.2 06/07/2023 0051   CL 94 (L) 06/07/2023 0051   CO2 26 06/07/2023 0051   GLUCOSE 131 (H) 06/07/2023 0051   BUN 18 06/07/2023 0051   CREATININE 3.15 (H) 06/07/2023 0051   CALCIUM 8.4 (L) 06/07/2023 0051   GFRNONAA 15 (L) 06/07/2023 0051   GFRAA >60 10/10/2016 0556   CBC    Component Value Date/Time   WBC 5.2 06/07/2023 0051   RBC 2.92 (L) 06/07/2023 0051   HGB 8.0 (L) 06/07/2023 0051   HCT 25.5 (L) 06/07/2023 0051   PLT 437 (H) 06/07/2023 0051   MCV 87.3 06/07/2023 0051   MCH 27.4 06/07/2023 0051   MCHC 31.4 06/07/2023 0051   RDW 16.4 (H) 06/07/2023 0051   LYMPHSABS 1.3 06/06/2023 0056   MONOABS 0.6 06/06/2023 0056   EOSABS 0.3 06/06/2023 0056   BASOSABS 0.1 06/06/2023 0056     Assessment/Plan:  AKI, oliguric - Scr was normal in May.  By history this is most consistent with ischemic ATN in setting of  N/V for the past week with concomitant ARB therapy.  No evidence of cortical necrosis on renal US, no NSAID use, time course too short for AIN, no obstruction.  Negative ANA, dsDNA, ASO, ANCA, and normal complements.  She had TDC placed 05/28/23 by IR, s/p first HD session 05/29/23, 2nd 7/5, 3rd 7/7, last 7/8.  Remains oliguric  Continue daily labs and assessment for HD needs --> HD for now and started process for outpt AKI dialysis placement.  Placed PPD 7/11 for that purpose. Given normal baseline function still expect recovery.   Avoid nephrotoxic medications including NSAIDs and iodinated intravenous contrast exposure unless the latter is absolutely indicated.  Preferred narcotic agents for pain control are hydromorphone, fentanyl, and methadone. Morphine should not be used. Avoid Baclofen and avoid oral sodium phosphate and magnesium citrate based laxatives / bowel preps. Continue strict Input and Output monitoring. Will monitor the patient closely with you and intervene or adjust therapy as indicated by changes in clinical status/labs  HTN- losartan on hold with AKI,  on amlodipine, UF with HD.  Low back pain - chronic Abdominal pain and persistent nausea - CT of abd/pelvis without source of symptoms and seems to be improved somewhat.  Per primary.  COPD - per primary svc; CXR this wek with persistent effusions and opacities ? Bronchitis.  UF with HD didn't help, doesn't appear pulm edema contributing. Normocytic anemia - TSAT 21%, dosed with IV iron and follow.  Transfused 1 unit PRBC 05/30/23 and 7-8s now. Start ESA.  Has outpt HD arranged for TTS at [facility] to start Tues - ok for d/c from nephrology perspective.  WIll continue to follow while in to assess for any AKI recovery, continuing daily labs for now.   Estill Bakes MD [facility] Pager [phone]

## 2023-06-07 NOTE — Plan of Care (Signed)
  Problem: Clinical Measurements: Goal: Will remain free from infection Outcome: Progressing   Problem: Clinical Measurements: Goal: Respiratory complications will improve Outcome: Progressing   Problem: Elimination: Goal: Will not experience complications related to bowel motility Outcome: Progressing   Problem: Safety: Goal: Ability to remain free from injury will improve Outcome: Progressing   Problem: Skin Integrity: Goal: Risk for impaired skin integrity will decrease Outcome: Progressing

## 2023-06-07 NOTE — Progress Notes (Signed)
Daily Progress Note   Patient Name: Samantha Fletcher       Date: 06/07/2023 DOB: Sep 25, 1946  Age: 77 y.o. MRN#: [id number] Attending Physician: Dickie La, MD Primary Care Physician: Marylen Ponto, MD Admit Date: 05/25/2023  Reason for Consultation/Follow-up: Establishing goals of care  Subjective: Medical records reviewed including progress notes, labs, imaging. Patient assessed at the bedside.  Samantha Fletcher is eating up in bedside chair.  No family present during my visit.  Created space and opportunity for patient's thoughts and feelings on her current illness.  Samantha Fletcher is initially hesitant to speak with me, but agreeable.  Reoriented her to the role of palliative care team and importance of goals of care conversations.  Samantha Fletcher feels this is not very necessary, as her goals are clear and Samantha Fletcher knows "what Samantha Fletcher needs to do."  Samantha Fletcher understands her options including hospice and discontinuation of dialysis should Samantha Fletcher desire in the future.  Patient wants to die peacefully when her time comes and not be in pain.  Samantha Fletcher has had discussions regarding her wishes with her son and sister in the past.  Samantha Fletcher does not want them called today.  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  Samantha Fletcher has based her decisions for herself on this experience.  Samantha Fletcher would never want a feeding tube and only agreed to dialysis because "they said it might make me feel better."  Samantha Fletcher does not feel like it is helping or harming in a noticeable way and is open to continuing HD and see how it goes in the outpatient setting.  Samantha Fletcher is also open to discussing her quality of life in more detail today.  Her main problem is that Samantha Fletcher is dizzy every time Samantha Fletcher stands up.  Samantha Fletcher has been insisting on MRI of her head for a long time.  Samantha Fletcher  also reports difficulty with thinking, memory, and headaches.  Samantha Fletcher tells me that Samantha Fletcher has refused home health or SNF because Samantha Fletcher would like to go home and does not like feeling that Samantha Fletcher needs to "get the house ready for all kinds of people to come in and out."  Samantha Fletcher feels confident that her son will be able to continue taking care of her, as he was before this admission.  They are living  together.  Samantha Fletcher tells me that he had a TBI at 45 and would not take it well whenever Samantha Fletcher passes away.  Samantha Fletcher does feel like he would be able to make decisions on her behalf and would name him as primary HCPOA, as well as Sister Darel Hong is secondary HCPOA.  A MOST form was introduced and extensive conversation was had, covering concepts specific to code status, artifical feeding and hydration, continued IV antibiotics and rehospitalization.  Encourage patient to consider completion of advanced directives while admitted.  Samantha Fletcher is open to this if Samantha Fletcher remains in the hospital when notary returns on Monday.  Questions and concerns addressed. PMT will continue to support holistically.   Length of Stay: 12   Physical Exam Vitals and nursing note reviewed.  Constitutional:      General: Samantha Fletcher is not in acute distress. Cardiovascular:     Rate and Rhythm: Normal rate.  Pulmonary:     Effort: Pulmonary effort is normal. No respiratory distress.  Neurological:     Mental Status: Samantha Fletcher is alert.  Psychiatric:        Mood and Affect: Mood normal.        Behavior: Behavior normal.            Vital Signs: BP (!) 113/54 (BP Location: Left Arm)   Pulse 69   Temp 98.4 F (36.9 C) (Oral)   Resp 14   Ht 5\' 2"  (1.575 m)   Wt 51 kg   LMP  (LMP Unknown)   SpO2 99%   BMI 20.56 kg/m  SpO2: SpO2: 99 % O2 Device: O2 Device: Nasal Cannula O2 Flow Rate: O2 Flow Rate (L/min): 2 L/min      Palliative Assessment/Data: 50%   Palliative Care Assessment & Plan   Patient Profile: 77 y.o. female  with past medical history of CHF, COPD,  TIA, CKD 3, atrial fibrillation not on anticoagulation due to being high fall risk, HTN, COPD and chronic pain  presented to ED on 05/25/23 from home with complaints of back/abdominal pain. Patient was admitted on 05/25/2023 with ARF/AGMA. Creatinine on admission was 7.93. Nephrology was consulted and HD was initiated on 7/4, which Samantha Fletcher tolerated well. However, Samantha Fletcher continues to have ongoing complaints of dyspnea and weakness; Samantha Fletcher is refusing discharge to SNF or HH.   Assessment: Goals of care conversation AKI with oliguria Acute hypoxic respiratory failure, improved Ischemic ATN Aspiration pneumonia   Recommendations/Plan: Continue DNR/DNI Patient continues to refuse SNF or home health.  Samantha Fletcher insists that her son will be able to care for her and does not want family called Patient's goal is to return home, feel better, continue HD if necessary and Samantha Fletcher can still tolerate Ongoing goals of care discussions Patient is open to completion of advance directives if still admitted MOST form introduced today PMT will continue to follow and support   Prognosis:  Unable to determine  Discharge Planning: To Be Determined  Care plan was discussed with patient   MDM high         Undray Allman Jeni Salles, PA-C  Palliative Medicine Team Team phone # [phone]  Thank you for allowing the Palliative Medicine Team to assist in the care of this patient. Please utilize secure chat with additional questions, if there is no response within 30 minutes please call the above phone number.  Palliative Medicine Team providers are available by phone from 7am to 7pm daily and can be reached through the team cell phone.  Should this patient require  assistance outside of these hours, please call the patient's attending physician.

## 2023-06-07 NOTE — Progress Notes (Addendum)
Summary:  Samantha Fletcher with a pertinent past medical history of HTN, suspected COPD, paroxysmal atrial fibrillation, chronic pain, recurrent UTIs on hospital day 7 after being admitted for AKI likely secondary to ischemic ATN with oliguria s/p large volume of IVF therapy c/b pulmonary edema and pleural effusions currently undergoing HD. Fourth HD session 7/08. PFT normal 2015.   Subjective:  No overnight events reported. Patient resting with eyes closed in bed, on 2L O2 via [place]. More responsive and with a more positive outlook this morning compared to morning rounds in the past week. Patient willing to sit up to drink coffee. Reports her breathing is better and she feels less pain. She reported feeling well enough to sit in a chair yesterday evening and is willing to do it again today. Also worked with PT, with noted SOB with activity. She denies CP, abdominal pain, vomiting, diarrhea, or pain with urination. Nausea improved. Patient updated on plan to engage SW and renal navigator to ensure safe discharge since she is medically stable and patient desires to go home.   Objective:  Vital signs in last 24 hours: Vitals:   06/07/23 0417 06/07/23 0759 06/07/23 0900 06/07/23 1300  BP: (!) 126/57  121/68 (!) 113/54  Pulse: 66  67 69  Resp: 17   14  Temp: 98.2 F (36.8 C)  98.4 F (36.9 C)   TempSrc: Oral  Oral Oral  SpO2: 99% 98%  99%  Weight: 51 kg     Height:        Intake/Output Summary (Last 24 hours) at 06/07/2023 1312 Last data filed at 06/07/2023 1300 Gross per 24 hour  Intake 1077.33 ml  Output 150 ml  Net 927.33 ml       Latest Ref Rng & Units 06/07/2023   12:51 AM 06/06/2023   12:56 AM 06/05/2023   12:59 AM  CBC  WBC 4.0 - 10.5 K/uL 5.2  8.6  10.6   Hemoglobin 12.0 - 15.0 g/dL 8.0  7.5  7.9   Hematocrit 36.0 - 46.0 % 25.5  23.8  24.6   Platelets 150 - 400 K/uL 437  406  424        Latest Ref Rng & Units 06/07/2023   12:51 AM 06/06/2023   12:56 AM 06/05/2023   12:59 AM   CMP  Glucose 70 - 99 mg/dL 161  87  096   BUN 8 - 23 mg/dL 18  29  19    Creatinine 0.44 - 1.00 mg/dL 0.45  4.09  8.11   Sodium 135 - 145 mmol/L 132  135  136   Potassium 3.5 - 5.1 mmol/L 4.2  3.9  3.7   Chloride 98 - 111 mmol/L 94  97  96   CO2 22 - 32 mmol/L 26  24  26    Calcium 8.9 - 10.3 mg/dL 8.4  8.0  8.0     Physical Exam Constitutional: Patient is resting with head of bed elevated while eating breakfast. Eyes mostly closed but participating in conversation.  CV: Regular rate and rhythm without murmurs on auscultation. No LE edema.  Pulmonary/Respiratory: Crackles upper lobes bilaterally. Normal respiratory effort with 2L O2 via [place].  Abdominal: Normal BS, soft, non-tender, non-distended.  Neuro: Alert and oriented to self and situation, conversing appropriately.  Skin: Warm and dry. Psych: Frustrated by questions, congruent affect.   Assessment/Plan:  Principal Problem:   Acute renal failure (ARF) (HCC) Active Problems:   Anxiety   Anemia, iron deficiency  Tobacco use disorder   Acute pulmonary edema (HCC)   Bilateral pleural effusion  AKI with oliguria Ischemic ATN Acute hypoxemic respiratory failure, improved Five HD sessions as of 7/12 (last 7/10). Lokelma held 7/12 for K 3.9 before HD session. K 4.2 today.   Patient producing urine with external catheter, only 150 ccs recorded in chart. No urine retention on bladder scan. Renal navigator confirmed services at [facility] Tuesdays, Thursdays, and Saturdays, but family cannot transport patient to sessions. Per SW, patient declined HH and family concerned about providing support when she returns. SW engaged to determine if PCS at home is an option.    Plan:  -Monitor I/Os, especially with external catheter -Avoid nephrotoxins -Lokelma 10 mg daily, hold if K low before HD sessions -Phenergan 12.5 PRN for nausea   Aspiration pneumonia/leukocytosis  Fevered 7/10, continued concern for aspiration pneumonia vs.  line infection. Afebrile today WBC 5.2 today. Blood culture with no growth in 2 days. E. Avium on urine microscopy report likely a contaminant. On aspiration precautions.  - Expected end date for azithromycin and ceftriaxone 7/15.  - Supplemental O2 as needed, needs mobility assessment to determine O2 needs at home - Aspiration precautions - Prednisone 40 mg for 5 days (7/12-7/16) - Albuterol PRN  - Benzonate 100 mg BID - Anoro Ellipta inhaler  Normocytic anemia Hx iron deficiency anemia Hemoglobin today at 8, improving. MCV 86.9, ferritin 483. No overt signs of bleeding per mouth or rectum.  - Minimize blood draws - Monitor for signs of bleeding - Continue to monitor   HTN Continues to be intermittently hypertensive. Holding home meds in setting of low normal, but given rising blood pressures will slowly restart home medications and continue to monitor vitals. Blood pressures on the lower side overnight to 7/11. Latest BP 165/75 (in HD), HR 70.  Plan:  - Amlodipine 10 mg (since 7/10 by nephrology)    Chronic back pain -Acetaminophen changed to PRN q6h -Dilaudid 1mg  PRN  Hypothyroidism Home dose levothyroxine  MDD/anxiety Per sister Darel Hong), patient on xanax at home (0.5 mg - 1 mg daily) for anxiety. Started here on atarax, mirtazapine, and venlafaxine. Patient attempting to eat more and demonstrated increased interest in participating in therapy since starting mirtazapine and venlafaxine on 7/12.  - Atarax 25 mg BID - Mirtazapine 7.5 mg daily  - Venlafaxine 37.5 mg daily    Diet: Renal VTE: Heparin Code: DNR  Prior to Admission Living Arrangement: Home Anticipated Discharge Location: Home (patient doesn't qualify for SNF, declined HH) Barriers to Discharge: Family unable to give her 24/7 support at home and transport her to outpatient dialysis appointments 3x/week.  Dispo: Anticipated discharge in approximately 1 day(s).   Philomena Doheny, MD, PGY-1 06/07/2023,  1:08 PM Pager: @[facility]AG[facility] @ After 5pm on weekdays and 1pm on weekends: On Call pager [phone]

## 2023-06-08 DIAGNOSIS — J810 Acute pulmonary edema: Secondary | ICD-10-CM | POA: Diagnosis not present

## 2023-06-08 DIAGNOSIS — Z7189 Other specified counseling: Secondary | ICD-10-CM | POA: Diagnosis not present

## 2023-06-08 DIAGNOSIS — J9601 Acute respiratory failure with hypoxia: Secondary | ICD-10-CM | POA: Diagnosis not present

## 2023-06-08 DIAGNOSIS — N170 Acute kidney failure with tubular necrosis: Secondary | ICD-10-CM | POA: Diagnosis not present

## 2023-06-08 LAB — RENAL FUNCTION PANEL
Albumin: 2.4 g/dL — ABNORMAL LOW (ref 3.5–5.0)
Albumin: 2.5 g/dL — ABNORMAL LOW (ref 3.5–5.0)
Anion gap: 13 (ref 5–15)
Anion gap: 14 (ref 5–15)
BUN: 31 mg/dL — ABNORMAL HIGH (ref 8–23)
BUN: 34 mg/dL — ABNORMAL HIGH (ref 8–23)
CO2: 24 mmol/L (ref 22–32)
CO2: 25 mmol/L (ref 22–32)
Calcium: 8.3 mg/dL — ABNORMAL LOW (ref 8.9–10.3)
Calcium: 8.5 mg/dL — ABNORMAL LOW (ref 8.9–10.3)
Chloride: 95 mmol/L — ABNORMAL LOW (ref 98–111)
Chloride: 95 mmol/L — ABNORMAL LOW (ref 98–111)
Creatinine, Ser: 4.3 mg/dL — ABNORMAL HIGH (ref 0.44–1.00)
Creatinine, Ser: 4.45 mg/dL — ABNORMAL HIGH (ref 0.44–1.00)
GFR, Estimated: 10 mL/min — ABNORMAL LOW (ref >60)
GFR, Estimated: 10 mL/min — ABNORMAL LOW (ref >60)
Glucose, Bld: 138 mg/dL — ABNORMAL HIGH (ref 70–99)
Glucose, Bld: 100 mg/dL — ABNORMAL HIGH (ref 70–99)
Phosphorus: 3.7 mg/dL (ref 2.5–4.6)
Phosphorus: 4.1 mg/dL (ref 2.5–4.6)
Potassium: 4.4 mmol/L (ref 3.5–5.1)
Potassium: 4.5 mmol/L (ref 3.5–5.1)
Sodium: 132 mmol/L — ABNORMAL LOW (ref 135–145)
Sodium: 134 mmol/L — ABNORMAL LOW (ref 135–145)

## 2023-06-08 LAB — CBC
HCT: 23.8 % — ABNORMAL LOW (ref 36.0–46.0)
Hemoglobin: 7.5 g/dL — ABNORMAL LOW (ref 12.0–15.0)
MCH: 28.1 pg (ref 26.0–34.0)
MCHC: 31.5 g/dL (ref 30.0–36.0)
MCV: 89.1 fL (ref 80.0–100.0)
Platelets: 449 10*3/uL — ABNORMAL HIGH (ref 150–400)
RBC: 2.67 MIL/uL — ABNORMAL LOW (ref 3.87–5.11)
RDW: 16.5 % — ABNORMAL HIGH (ref 11.5–15.5)
WBC: 7.2 10*3/uL (ref 4.0–10.5)
nRBC: 0 % (ref 0.0–0.2)

## 2023-06-08 MED ORDER — POLYSACCHARIDE IRON COMPLEX 150 MG PO CAPS
150.0000 mg | ORAL_CAPSULE | Freq: Every day | ORAL | Status: DC
  Administered 2023-06-08 – 2023-06-09 (×2): 150 mg via ORAL
  Filled 2023-06-08 (×2): qty 1

## 2023-06-08 MED ORDER — PROMETHAZINE HCL 12.5 MG PO TABS: 12.5000 mg | ORAL_TABLET | Freq: Once | ORAL | Status: DC

## 2023-06-08 MED ORDER — PROMETHAZINE HCL 12.5 MG PO TABS
12.5000 mg | ORAL_TABLET | Freq: Once | ORAL | Status: AC
  Administered 2023-06-08: 12.5 mg via ORAL
  Filled 2023-06-08: qty 1

## 2023-06-08 NOTE — Plan of Care (Signed)
  Problem: Education: Goal: Knowledge of General Education information will improve Description Including pain rating scale, medication(s)/side effects and non-pharmacologic comfort measures Outcome: Progressing   Problem: Health Behavior/Discharge Planning: Goal: Ability to manage health-related needs will improve Outcome: Progressing   
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
## 2023-06-08 NOTE — Progress Notes (Signed)
Daily Progress Note   Patient Name: Samantha Fletcher       Date: 06/08/2023 DOB: [birth year]  Age: 77 y.o. MRN#: [id number] Attending Physician: Dickie La, MD Primary Care Physician: Marylen Ponto, MD Admit Date: 05/25/2023  Reason for Consultation/Follow-up: Establishing goals of care  Subjective: Medical records reviewed including progress notes, labs, imaging. Patient assessed at the bedside.  She is eating her lunch, reports feeling fine today. No family present.  Created space and opportunity for patient's thoughts and feelings on her current illness. She is appreciative of the advance directives brought today and remains agreeable to filling this out tomorrow. Explored her questions on the MOST form and offered to assist with completion today. She prefers to do everything tomorrow. No other concerns.  Questions and concerns addressed. PMT will continue to support holistically.   Length of Stay: 13   Physical Exam Vitals and nursing note reviewed.  Constitutional:      General: She is not in acute distress. Cardiovascular:     Rate and Rhythm: Normal rate.  Pulmonary:     Effort: Pulmonary effort is normal. No respiratory distress.  Neurological:     Mental Status: She is alert.  Psychiatric:        Mood and Affect: Mood normal.        Behavior: Behavior normal.            Vital Signs: BP 138/66 (BP Location: Left Arm)   Pulse 64   Temp 98.1 F (36.7 C) (Oral)   Resp 19   Ht 5\' 2"  (1.575 m)   Wt 53.1 kg   LMP  (LMP Unknown)   SpO2 93%   BMI 21.41 kg/m  SpO2: SpO2: 93 % O2 Device: O2 Device: Nasal Cannula O2 Flow Rate: O2 Flow Rate (L/min): 2 L/min      Palliative Assessment/Data: 50%   Palliative Care Assessment & Plan   Patient Profile: 77 y.o. female  with  past medical history of CHF, COPD, TIA, CKD 3, atrial fibrillation not on anticoagulation due to being high fall risk, HTN, COPD and chronic pain  presented to ED on 05/25/23 from home with complaints of back/abdominal pain. Patient was admitted on 05/25/2023 with ARF/AGMA. Creatinine on admission was 7.93. Nephrology was consulted and HD was initiated on 7/4, which she tolerated  well. However, she continues to have ongoing complaints of dyspnea and weakness; she is refusing discharge to SNF or HH.   Assessment: Goals of care conversation AKI with oliguria Acute hypoxic respiratory failure, improved Ischemic ATN Aspiration pneumonia   Recommendations/Plan: Continue DNR/DNI Patient's goal is to return home, feel better, continue HD if necessary and she can still tolerate Spiritual care consult for completion of advance directives tomorrow Patient prefers to complete MOST form tomorrow as well PMT will continue to follow and support   Prognosis:  Unable to determine  Discharge Planning: Home with Home Health  Care plan was discussed with patient   Total time: I spent 35 minutes in the care of the patient today in the above activities and documenting the encounter.   Richardson Dopp, PA-C Palliative Medicine Team Team phone # [phone]  Thank you for allowing the Palliative Medicine Team to assist in the care of this patient. Please utilize secure chat with additional questions, if there is no response within 30 minutes please call the above phone number.  Palliative Medicine Team providers are available by phone from 7am to 7pm daily and can be reached through the team cell phone.  Should this patient require assistance outside of these hours, please call the patient's attending physician.  Portions of this note are a verbal dictation therefore any spelling and/or grammatical errors are due to the "[employer] One" system interpretation.

## 2023-06-08 NOTE — Plan of Care (Signed)
  Problem: Clinical Measurements: Goal: Diagnostic test results will improve Outcome: Progressing   Problem: Coping: Goal: Level of anxiety will decrease Outcome: Progressing   Problem: Pain Managment: Goal: General experience of comfort will improve Outcome: Progressing   Problem: Safety: Goal: Ability to remain free from injury will improve Outcome: Progressing   

## 2023-06-08 NOTE — TOC Progression Note (Signed)
Transition of Care [facility]) - Progression Note    Patient Details  Name: Samantha Fletcher MRN: [id number] Date of Birth: 12-27-1945  [facility] CM/SW Contact  Ronny Bacon, RN Phone Number: 06/08/2023, 12:57 PM  Clinical Narrative:  HH Pt/OT arranged through [facility].     Expected Discharge Plan: Home/Self Care Barriers to Discharge: Continued Medical Work up  Expected Discharge Plan and Services In-house Referral: NA Discharge Planning Services: CM Consult Post Acute Care Choice: NA Living arrangements for the past 2 months: Single Family Home                 DME Arranged: N/A DME Agency: NA       HH Arranged: NA           Social Determinants of Health (SDOH) Interventions SDOH Screenings   Food Insecurity: No Food Insecurity (05/26/2023)  Housing: Low Risk  (05/26/2023)  Transportation Needs: No Transportation Needs (05/26/2023)  Utilities: Not At Risk (05/26/2023)  Financial Resource Strain: Low Risk  (05/14/2022)   Received from [facility], [facility]  Tobacco Use: Medium Risk (05/26/2023)    Readmission Risk Interventions     No data to display

## 2023-06-08 NOTE — Progress Notes (Signed)
Patient ID: Samantha Fletcher, female   DOB: 1946/07/07, 77 y.o.   MRN: [id number] S: UOP doc yesterday.   Had HD yest tolerated fine.  Seems more energetic this AM and denies new complaints.   O:BP (!) 148/71 (BP Location: Left Arm)   Pulse 68   Temp 97.8 F (36.6 C) (Oral)   Resp 18   Ht 5\' 2"  (1.575 m)   Wt 53.1 kg   LMP  (LMP Unknown)   SpO2 90%   BMI 21.41 kg/m   Intake/Output Summary (Last 24 hours) at 06/08/2023 1047 Last data filed at 06/07/2023 2200 Gross per 24 hour  Intake 240 ml  Output 280 ml  Net -40 ml    Gen: NAD CVS: RRR Resp:dec BS bases, normal WOB at rest  Abd:+BS, soft, NT/nD  Ext: no edema RIJ TDC c/d/i  Recent Labs  Lab 06/03/23 0048 06/04/23 0054 06/05/23 0059 06/06/23 0056 06/07/23 0051 06/08/23 0046 06/08/23 0629  NA 132* 132* 136 135 132* 132* 134*  K 4.2 4.8 3.7 3.9 4.2 4.4 4.5  CL 96* 96* 96* 97* 94* 95* 95*  CO2 25 24 26 24 26 24 25   GLUCOSE 89 87 114* 87 131* 138* 100*  BUN 23 31* 19 29* 18 31* 34*  CREATININE 3.83* 4.82* 3.27* 4.64* 3.15* 4.30* 4.45*  ALBUMIN 2.1* 2.1* 2.1* 2.0* 2.2* 2.4* 2.5*  CALCIUM 7.7* 8.1* 8.0* 8.0* 8.4* 8.3* 8.5*  PHOS 3.7 4.9* 3.2 4.6 3.6 3.7 4.1   Liver Function Tests: Recent Labs  Lab 06/07/23 0051 06/08/23 0046 06/08/23 0629  ALBUMIN 2.2* 2.4* 2.5*   No results for input(s): "LIPASE", "AMYLASE" in the last 168 hours.  No results for input(s): "AMMONIA" in the last 168 hours. CBC: Recent Labs  Lab 06/04/23 0054 06/05/23 0059 06/06/23 0056 06/07/23 0051 06/08/23 0046  WBC 13.7* 10.6* 8.6 5.2 7.2  NEUTROABS  --  8.6* 6.3  --   --   HGB 8.0* 7.9* 7.5* 8.0* 7.5*  HCT 25.5* 24.6* 23.8* 25.5* 23.8*  MCV 86.4 85.7 86.9 87.3 89.1  PLT 431* 424* 406* 437* 449*   Cardiac Enzymes: No results for input(s): "CKTOTAL", "CKMB", "CKMBINDEX", "TROPONINI" in the last 168 hours. CBG: No results for input(s): "GLUCAP" in the last 168 hours.  Iron Studies:  Recent Labs    06/06/23 0056  FERRITIN  483*    Studies/Results: No results found.  albuterol  2.5 mg Nebulization BID   amLODipine  10 mg Oral Daily   azithromycin  500 mg Oral Daily   Chlorhexidine Gluconate Cloth  6 each Topical Q0600   [START ON 06/09/2023] darbepoetin (ARANESP) injection - DIALYSIS  60 mcg Subcutaneous Q Mon-1800   heparin injection (subcutaneous)  5,000 Units Subcutaneous Q8H   hydrocortisone cream   Topical BID   hydrOXYzine  25 mg Oral BID   levothyroxine  75 mcg Oral Q0600   lidocaine  1 patch Transdermal Q24H   lidocaine  1 Application Urethral Daily   lidocaine-EPINEPHrine  10 mL Infiltration Once   melatonin  3 mg Oral QHS   mirtazapine  7.5 mg Oral QHS   pantoprazole  40 mg Oral Daily   polyethylene glycol  17 g Oral Daily   predniSONE  40 mg Oral Q breakfast   senna-docusate  2 tablet Oral BID   sodium zirconium cyclosilicate  10 g Oral Daily   umeclidinium-vilanterol  1 puff Inhalation Daily   venlafaxine XR  37.5 mg Oral Q breakfast  BMET    Component Value Date/Time   NA 134 (L) 06/08/2023 0629   K 4.5 06/08/2023 0629   CL 95 (L) 06/08/2023 0629   CO2 25 06/08/2023 0629   GLUCOSE 100 (H) 06/08/2023 0629   BUN 34 (H) 06/08/2023 0629   CREATININE 4.45 (H) 06/08/2023 0629   CALCIUM 8.5 (L) 06/08/2023 0629   GFRNONAA 10 (L) 06/08/2023 0629   GFRAA >60 10/10/2016 0556   CBC    Component Value Date/Time   WBC 7.2 06/08/2023 0046   RBC 2.67 (L) 06/08/2023 0046   HGB 7.5 (L) 06/08/2023 0046   HCT 23.8 (L) 06/08/2023 0046   PLT 449 (H) 06/08/2023 0046   MCV 89.1 06/08/2023 0046   MCH 28.1 06/08/2023 0046   MCHC 31.5 06/08/2023 0046   RDW 16.5 (H) 06/08/2023 0046   LYMPHSABS 1.3 06/06/2023 0056   MONOABS 0.6 06/06/2023 0056   EOSABS 0.3 06/06/2023 0056   BASOSABS 0.1 06/06/2023 0056     Assessment/Plan:  AKI, oliguric - Scr was normal in May.  By history this is most consistent with ischemic ATN in setting of N/V for the past week with concomitant ARB therapy.  No  evidence of cortical necrosis on renal US, no NSAID use, time course too short for AIN, no obstruction.  Negative ANA, dsDNA, ASO, ANCA, and normal complements.  She had TDC placed 05/28/23 by IR, s/p first HD session 05/29/23.  Remains oliguric  Continue daily labs and assessment for HD needs --> intradialytic Cr rise suggests no recovery yet.  Has HD arranged outpt starting Tues 7/16 at [place] with plans to d/c this weekend.  Placed PPD 7/11. Given normal baseline function still hopeful for recovery.   Avoid nephrotoxic medications including NSAIDs and iodinated intravenous contrast exposure unless the latter is absolutely indicated.  Preferred narcotic agents for pain control are hydromorphone, fentanyl, and methadone. Morphine should not be used. Avoid Baclofen and avoid oral sodium phosphate and magnesium citrate based laxatives / bowel preps. Continue strict Input and Output monitoring. Will monitor the patient closely with you and intervene or adjust therapy as indicated by changes in clinical status/labs  HTN- losartan on hold with AKI,  on amlodipine, UF with HD.  Low back pain - chronic Abdominal pain and persistent nausea - CT of abd/pelvis without source of symptoms and seems to be improved somewhat.  Per primary.  COPD - per primary svc; CXR this wek with persistent effusions and opacities ? Bronchitis.  UF with HD didn't help, doesn't appear pulm edema contributing. Normocytic anemia - TSAT 21%, dosed with IV iron and follow.  Transfused 1 unit PRBC 05/30/23 and 7-8s now. Start ESA.  Has outpt HD arranged for TTS at [place] to start Tues - ok for d/c from nephrology perspective.  WIll continue to follow while in to assess for any AKI recovery, continuing daily labs for now.  She's been recommended for SNF but declined, declining HH now too --> team working on safe d/c plan.   Estill Bakes MD [facility] Pager [phone]

## 2023-06-09 ENCOUNTER — Inpatient Hospital Stay (HOSPITAL_COMMUNITY): Payer: Medicare Other

## 2023-06-09 ENCOUNTER — Other Ambulatory Visit (HOSPITAL_COMMUNITY): Payer: Self-pay

## 2023-06-09 ENCOUNTER — Telehealth (HOSPITAL_COMMUNITY): Payer: Self-pay | Admitting: Pharmacy Technician

## 2023-06-09 DIAGNOSIS — Z992 Dependence on renal dialysis: Secondary | ICD-10-CM | POA: Diagnosis not present

## 2023-06-09 DIAGNOSIS — N186 End stage renal disease: Secondary | ICD-10-CM | POA: Diagnosis not present

## 2023-06-09 DIAGNOSIS — Z7189 Other specified counseling: Secondary | ICD-10-CM | POA: Diagnosis not present

## 2023-06-09 DIAGNOSIS — F32A Depression, unspecified: Secondary | ICD-10-CM

## 2023-06-09 DIAGNOSIS — J441 Chronic obstructive pulmonary disease with (acute) exacerbation: Secondary | ICD-10-CM

## 2023-06-09 LAB — RENAL FUNCTION PANEL
Albumin: 2.6 g/dL — ABNORMAL LOW (ref 3.5–5.0)
Albumin: 2.8 g/dL — ABNORMAL LOW (ref 3.5–5.0)
Anion gap: 12 (ref 5–15)
Anion gap: 16 — ABNORMAL HIGH (ref 5–15)
BUN: 50 mg/dL — ABNORMAL HIGH (ref 8–23)
BUN: 57 mg/dL — ABNORMAL HIGH (ref 8–23)
CO2: 24 mmol/L (ref 22–32)
CO2: 23 mmol/L (ref 22–32)
Calcium: 8 mg/dL — ABNORMAL LOW (ref 8.9–10.3)
Calcium: 8.4 mg/dL — ABNORMAL LOW (ref 8.9–10.3)
Chloride: 95 mmol/L — ABNORMAL LOW (ref 98–111)
Chloride: 95 mmol/L — ABNORMAL LOW (ref 98–111)
Creatinine, Ser: 5.59 mg/dL — ABNORMAL HIGH (ref 0.44–1.00)
Creatinine, Ser: 5.86 mg/dL — ABNORMAL HIGH (ref 0.44–1.00)
GFR, Estimated: 7 mL/min — ABNORMAL LOW (ref >60)
GFR, Estimated: 7 mL/min — ABNORMAL LOW (ref >60)
Glucose, Bld: 146 mg/dL — ABNORMAL HIGH (ref 70–99)
Glucose, Bld: 113 mg/dL — ABNORMAL HIGH (ref 70–99)
Phosphorus: 3.7 mg/dL (ref 2.5–4.6)
Phosphorus: 3.7 mg/dL (ref 2.5–4.6)
Potassium: 4.4 mmol/L (ref 3.5–5.1)
Potassium: 5.1 mmol/L (ref 3.5–5.1)
Sodium: 131 mmol/L — ABNORMAL LOW (ref 135–145)
Sodium: 134 mmol/L — ABNORMAL LOW (ref 135–145)

## 2023-06-09 LAB — CBC
HCT: 23.9 % — ABNORMAL LOW (ref 36.0–46.0)
Hemoglobin: 7.3 g/dL — ABNORMAL LOW (ref 12.0–15.0)
MCH: 26.7 pg (ref 26.0–34.0)
MCHC: 30.5 g/dL (ref 30.0–36.0)
MCV: 87.5 fL (ref 80.0–100.0)
Platelets: 468 10*3/uL — ABNORMAL HIGH (ref 150–400)
RBC: 2.73 MIL/uL — ABNORMAL LOW (ref 3.87–5.11)
RDW: 16.5 % — ABNORMAL HIGH (ref 11.5–15.5)
WBC: 9.5 10*3/uL (ref 4.0–10.5)
nRBC: 0 % (ref 0.0–0.2)

## 2023-06-09 LAB — CULTURE, BLOOD (ROUTINE X 2): Culture: NO GROWTH

## 2023-06-09 MED ORDER — AMLODIPINE BESYLATE 10 MG PO TABS
10.0000 mg | ORAL_TABLET | Freq: Every day | ORAL | 0 refills | Status: DC
  Filled 2023-06-09: qty 30, 30d supply, fill #0

## 2023-06-09 MED ORDER — AZITHROMYCIN 250 MG PO TABS
250.0000 mg | ORAL_TABLET | Freq: Once | ORAL | Status: AC
  Administered 2023-06-09: 250 mg via ORAL
  Filled 2023-06-09: qty 1

## 2023-06-09 MED ORDER — SPIRIVA RESPIMAT 2.5 MCG/ACT IN AERS
2.0000 | INHALATION_SPRAY | Freq: Every day | RESPIRATORY_TRACT | 0 refills | Status: DC
  Filled 2023-06-09: qty 4, 30d supply, fill #0

## 2023-06-09 MED ORDER — POLYETHYLENE GLYCOL 3350 17 GM/SCOOP PO POWD
17.0000 g | Freq: Every day | ORAL | 0 refills | Status: DC
  Filled 2023-06-09: qty 238, 14d supply, fill #0

## 2023-06-09 MED ORDER — SODIUM CHLORIDE 0.9 % IV SOLN
1.0000 g | Freq: Once | INTRAVENOUS | Status: AC
  Administered 2023-06-09: 1 g via INTRAVENOUS
  Filled 2023-06-09: qty 10

## 2023-06-09 MED ORDER — HYDROXYZINE HCL 25 MG PO TABS
25.0000 mg | ORAL_TABLET | Freq: Two times a day (BID) | ORAL | 0 refills | Status: DC
  Filled 2023-06-09: qty 60, 30d supply, fill #0

## 2023-06-09 MED ORDER — PREDNISONE 20 MG PO TABS
40.0000 mg | ORAL_TABLET | Freq: Every day | ORAL | 0 refills | Status: AC
  Filled 2023-06-09: qty 1, 1d supply, fill #0
  Filled 2023-06-09: qty 2, 1d supply, fill #0

## 2023-06-09 MED ORDER — VENLAFAXINE HCL ER 37.5 MG PO CP24
37.5000 mg | ORAL_CAPSULE | Freq: Every day | ORAL | 0 refills | Status: AC
  Filled 2023-06-09: qty 30, 30d supply, fill #0

## 2023-06-09 MED ORDER — ACETAMINOPHEN 500 MG PO TABS
1000.0000 mg | ORAL_TABLET | Freq: Four times a day (QID) | ORAL | 0 refills | Status: DC | PRN
Start: 2023-06-09 — End: 2023-09-19
  Filled 2023-06-09: qty 100, 13d supply, fill #0

## 2023-06-09 MED ORDER — MIRTAZAPINE 7.5 MG PO TABS
7.5000 mg | ORAL_TABLET | Freq: Every day | ORAL | 0 refills | Status: DC
  Filled 2023-06-09: qty 30, 30d supply, fill #0

## 2023-06-09 MED ORDER — SENNOSIDES-DOCUSATE SODIUM 8.6-50 MG PO TABS
2.0000 | ORAL_TABLET | Freq: Two times a day (BID) | ORAL | 0 refills | Status: DC
  Filled 2023-06-09: qty 120, 30d supply, fill #0

## 2023-06-09 MED ORDER — IRON POLYSACCH CMPLX-B12-FA 150-0.025-1 MG PO CAPS
1.0000 | ORAL_CAPSULE | Freq: Every day | ORAL | 0 refills | Status: DC
  Filled 2023-06-09: qty 30, 30d supply, fill #0

## 2023-06-09 MED ORDER — LIDOCAINE 5 % EX PTCH
1.0000 | MEDICATED_PATCH | CUTANEOUS | 0 refills | Status: DC
  Filled 2023-06-09: qty 30, 30d supply, fill #0

## 2023-06-09 NOTE — Telephone Encounter (Signed)
Pharmacy Patient Advocate Encounter  Received notification from [facility] that Prior Authorization for Lidocaine 5% patches has been APPROVED from 06/09/2023 to 11/25/2023.Marland Kitchen  PA #/Case ID/Reference #: [id number]

## 2023-06-09 NOTE — Progress Notes (Signed)
SATURATION QUALIFICATIONS: (This note is used to comply with regulatory documentation for home oxygen)  Patient Saturations on Room Air at Rest = 82%, Needs 4L to maintain O2 saturation above 88% at REst  Patient Saturations on Room Air while Ambulating = NT as desaturates on RA at rest  Patient Saturations on 6 Liters of oxygen while Ambulating = 89%  Please briefly explain why patient needs home oxygen:Pt needing 4LO2 at rest and 6LO2 with activity to keep sats >88%.   Elaiza Shoberg M,PT Acute Rehab Services [phone]

## 2023-06-09 NOTE — Progress Notes (Signed)
   06/09/23 1200  Mobility  Activity Ambulated with assistance in room  Level of Assistance Standby assist, set-up cues, supervision of patient - no hands on  Assistive Device Front wheel walker  Distance Ambulated (ft) 30 ft  Activity Response Tolerated well  Mobility Referral Yes  $Mobility charge 1 Mobility  Mobility Specialist Start Time (ACUTE ONLY) 1200  Mobility Specialist Stop Time (ACUTE ONLY) 1212  Mobility Specialist Time Calculation (min) (ACUTE ONLY) 12 min   Pt agreeable to attempt ambulation on 5LO2 per case manager request. SpO2 89% and above on 5LO2 with exertion. No physical assistance throughout. Pt back in bed with all needs met, alarm on.   Barnie Mort, BS Mobility Specialist

## 2023-06-09 NOTE — Progress Notes (Signed)
Nurse requested Mobility Specialist to perform oxygen saturation test with pt which includes removing pt from oxygen both at rest and while ambulating.  Below are the results from that testing.     Patient Saturations on Room Air at Rest = spO2 82%  Patient Saturations on Room Air while Ambulating = sp02 N/A% .   Patient Saturations on 5 Liters of oxygen while Ambulating = sp02 90%  At end of testing pt left in room on 4  Liters of oxygen.  Reported results to nurse.   Addison Lank Mobility Specialist Please contact via SecureChat or  Rehab office at [phone]

## 2023-06-09 NOTE — Telephone Encounter (Signed)
Pharmacy Patient Advocate Encounter   Received notification  that prior authorization for Lidocaine 5% patches is required/requested.    PA submitted to [facility] via CoverMyMeds Key/confirmation #/EOC [id number] Status is pending

## 2023-06-09 NOTE — Discharge Summary (Cosign Needed Addendum)
Name: Samantha Fletcher MRN: [id number] DOB: 10/19/1946 77 y.o. PCP: Marylen Ponto, MD  Date of Admission: 05/25/2023 10:42 PM Date of Discharge: 06/09/2023 7:16 PM Attending Physician: Dr. Antony Contras  Discharge Diagnosis: Principal Problem:   Acute renal failure (ARF) (HCC) Active Problems:   Anxiety   Anemia, iron deficiency   Tobacco use disorder   Acute pulmonary edema (HCC)   Bilateral pleural effusion    Discharge Medications: Allergies as of 06/09/2023       Reactions   Celecoxib Other (See Comments)   Mouth sores   Gabapentin Other (See Comments)   Confused, psychotic    Nabumetone Other (See Comments)   Mouth sores   Naproxen Other (See Comments)   Dropped WBC count   Tramadol Other (See Comments)   Mouth sores   Lyrica [pregabalin] Other (See Comments)   confusion   Macrobid [nitrofurantoin]    Vomiting   Buspar [buspirone] Anxiety        Medication List     STOP taking these medications    ciprofloxacin 250 MG tablet Commonly known as: CIPRO   cyclobenzaprine 5 MG tablet Commonly known as: FLEXERIL   Stool Softener 100 MG capsule Generic drug: docusate sodium   telmisartan-hydrochlorothiazide 40-12.5 MG tablet Commonly known as: MICARDIS HCT   tiZANidine 4 MG tablet Commonly known as: ZANAFLEX       TAKE these medications    Acetaminophen Extra Strength 500 MG Tabs Take 2 tablets (1,000 mg total) by mouth every 6 (six) hours as needed for fever then as directed thereafter   albuterol (2.5 MG/3ML) 0.083% nebulizer solution Commonly known as: PROVENTIL Take 3 mLs (2.5 mg total) by nebulization every 4 (four) hours as needed for wheezing.   allopurinol 100 MG tablet Commonly known as: ZYLOPRIM Take 100 mg by mouth daily.   amLODipine 10 MG tablet Commonly known as: NORVASC Take 1 tablet (10 mg total) by mouth daily. Start taking on: June 10, 2023 What changed:  medication strength how much to take   donepezil 5 MG  tablet Commonly known as: ARICEPT Take 5 mg by mouth daily with breakfast.   feeding supplement Liqd Take 237 mLs by mouth 2 (two) times daily between meals.   hydrOXYzine 25 MG tablet Commonly known as: ATARAX Take 1 tablet (25 mg total) by mouth 2 (two) times daily.   levothyroxine 75 MCG tablet Commonly known as: SYNTHROID Take 75 mcg by mouth daily before breakfast.   lidocaine 5 % Commonly known as: LIDODERM Place 1 patch onto the skin daily. Remove & Discard patch within 12 hours or as directed by MD   Milnacipran 50 MG Tabs tablet Commonly known as: SAVELLA Take 50 mg by mouth 2 (two) times daily.   mirtazapine 7.5 MG tablet Commonly known as: REMERON Take 1 tablet (7.5 mg total) by mouth at bedtime.   pantoprazole 40 MG tablet Commonly known as: PROTONIX Take 1 tablet (40 mg total) by mouth 2 (two) times daily.   Poly-Iron 150 Forte 150-25-1 MG-MCG-MG Caps Generic drug: Iron Polysacch Cmplx-B12-FA Take 1 capsule by mouth daily. Start taking on: June 10, 2023   polyethylene glycol powder 17 GM/SCOOP powder Commonly known as: GLYCOLAX/MIRALAX Take 17 g (1 capful) by mouth daily. Start taking on: June 10, 2023   predniSONE 20 MG tablet Commonly known as: DELTASONE Take 2 tablets (40 mg total) by mouth daily with breakfast for 1 dose. Start taking on: June 10, 2023   rosuvastatin 10 MG tablet  Commonly known as: CRESTOR Take 10 mg by mouth daily.   Senexon-S 8.6-50 MG tablet Generic drug: senna-docusate Take 2 tablets by mouth 2 (two) times daily.   Spiriva Respimat 2.5 MCG/ACT Aers Generic drug: Tiotropium Bromide Monohydrate Inhale 2 puffs into the lungs daily.   venlafaxine XR 37.5 MG 24 hr capsule Commonly known as: EFFEXOR-XR Take 1 capsule (37.5 mg total) by mouth daily with breakfast for 30 doses. Start taking on: June 10, 2023               Durable Medical Equipment  (From admission, onward)           Start     Ordered    06/09/23 1553  For home use only DME oxygen  Once       Question Answer Comment  Length of Need 6 Months   Mode or (Route) Nasal cannula   Liters per Minute 5   Frequency Continuous (stationary and portable oxygen unit needed)   Oxygen conserving device Yes   Oxygen delivery system Gas      06/09/23 1553   06/09/23 1202  For home use only DME 4 wheeled rolling walker with seat  Once       Question:  Patient needs a walker to treat with the following condition  Answer:  Weakness   06/09/23 1201            Disposition and follow-up:   Samantha Fletcher was discharged from [facility] in Stable condition.  At the hospital follow up visit please address:  1.  Follow-up:  AKI   -  Patient still oliguric, may need to engage with outpatient nephrology if there is limited improvement with dialysis. Patient to receive outpatient dialysis T, Th, Sat at [facility] with lab follow up by dialysis team. Patient reminded of the importance of going to these sessions while her kidneys recover, please follow-up if patient has reliable transportation and/or any missed sessions. Recommend follow up renal function panel, phosphorus, and magnesium.   COPD exacerbation -  Patient with suspected COPD exacerbation that improved with 5 day course of prednisone, antibiotics, and inhalers. Discharging on 5L supplemental oxygen based on mobility assessment. Started patient on PRN inhalers like Anoro Eliipta but patient co-pay too high. Recommend further COPD workup and management.   Normocytic anemia - Patient with Hgb below normal limits but stable. Required 1 PRBC transfusion when hgb < 7. Imaging and physical exam negative for acute bleeding. Discharged with iron supplements. Recommend following up a CBC in the outpatient setting.   HTN   - Introduced home medical management for HTN slowly, per nephrology. Resumed amlodipine 5 mg daily, which was increased to 10 mg daily by nephrology.  Recommend follow up on blood pressures to determine if other home blood pressure medications are needed.  Anxiety/MDD   - Patient started on hydroxyzine, mirtazapine, and venlafaxine with improved mood. Patient discharged on 30-day supply, please discuss ongoing need and patient seems to benefit from them and this may be a better combination than a benzodiazepine (which the patient's sister reported the patient taking sometimes)   2.  Labs / imaging needed at time of follow-up: CBC, RFP, Phosphorus, Mg  3.  Pending labs/ test needing follow-up: n/a  4.  Medication Changes  STOPPED  - Telmistartan-hydrochlorothiazide 40-12.5  - Tizanidine 4 mg q6H PRN  - Cyclobenzaprine 5 mg   - Ciprofloxacin 250 mg BID (historical med)   ADDED  -  Prednisone 40 mg total - one dose remaining for 7/16  - Iron polysaccharides 150 mg capsule daily  - Feeding supplement liquid between meals  - Venlafaxine 24 hr capsule 37.5 mg daily  - Mirtazapine 7.5 mg daily  - Hydroxyzine 25 mg daily  - Spiriva inhaler PRN    MODIFIED  - Amlodipine 10 mg (modified 7/10 by nephrology)  - Senna docusate BID  Follow-up Appointments:  Follow-up Information     [facility] [place]. Go on 06/10/2023.   Why: Schedule is Tuesday, Thursday, Saturday with 10:15 chair time.  On Tuesday (7/16), please arrive at 9:30 am to complete paperwork prior to treatment. Contact information: [address] [place], [place] [address] [phone]        [facility] Follow up.   Specialty: Home Health Services Why: Physical therapy and occupational therapy. Office will call to arrange follow up. Contact information: [address] [place] [address] [phone]         Marylen Ponto, MD. Go on 06/16/2023.   Specialty: Family Medicine Why: @11 :00am Contact information: [address] [place] [address] [phone]         [facility] Follow up.   Why: home  oxygen, rollator Contact information: [address] [place] [address] [phone]                 Hospital Course by problem list: Summary:  Samantha Fletcher is a 77 y.o. with a pertinent past medical history of HTN, suspected COPD, paroxysmal atrial fibrillation, chronic pain, and recurrent UTIs who was admitted for AKI with anuria on 05/25/23 likely secondary to ischemic ATN. She has been treated by the IMTS service for AKI, COPD exacerbation, and concern for infection in the lungs or HD catheter. Nephrology has been following and started patient on HD sessions.    AKI with oliguria Ischemic ATN Acute hypoxemic respiratory failure, improved Nephrology started patient on HD for her first session on 7/04 which was well tolerated. Patient has had six HD sessions as of 7/13. Patient on Lokelma 10 mg daily, held 7/12 for K 3.9 before HD session. Patient producing urine with external catheter with 150-300 ccs of daily output. No urine retention on bladder scans. Renal navigator confirmed services at [facility] Tuesdays, Thursdays, and Saturdays, but family reported some difficulties with transport. Son Mellody Dance has been helping to transport patient. Per SW, patient did not qualify for SNF and initially turned down [facility] services. Patient changed her mind over the length of the stay and accepted [facility] services, which have been confirmed by SW. Patient given phenergan 12.5 PRN for nausea due to concerns for prolonged QT with Zofran.    Aspiration pneumonia/leukocytosis  Possible line infection (r/o) COPD exacerbation  CXR 7/09 showing small layering pleural effusions on the left-greater-than-right with overlying atelectasis or consolidation, concerning for aspiration pneumonia. Clinically correlated with worsening cough, clear sputum, increased O2 need via nasal cannula, and WBC 10.7. Questionable lung fibrosis given previous scarring found on previous CXR in the context of suspected COPD with  worsening cough, sputum, and shortness of breath. Augmentin 7/09- 7/14. Fevered 7/10, team concerned for aspiration pneumonia vs. line infection. Blood culture with no growth in 4 days. E. Avium on microscopy report likely a contaminant. Patient remained on aspiration precautions. Augmentin stopped on 7/10 and patient changed to IV Azithromycin and Ceftriaxone. Patient changed to PO Azithromycin on 7/12. Azithromycin and Ceftriaxone therapy was completed on  7/15 (received a dose of each prior to discharge). Patient with 32 pack year smoking history, negative PFTs 2015, but given increased cough, sputum, and dyspnea suspect COPD exacerbation. Patient with marked improvement in breathing following Prednisone 40 mg (7/12-7/16), Benzonate 100 mg BID, Anoro Ellipta inhaler, and Albuterol inhaler PRN. PRN inhalers for home expensive, SW was unable to find medication assistance prior to discharge for patient to get Anoro or Breo Ellipta inhalers.     Normocytic anemia Hx iron deficiency anemia Patient transfused 1 unit PRBC 7/5/204 as hemoglobin was found to be < 7. No overt signs of bleeding per mouth or rectum on imaging or physical exam. Minimized blood draws, monitored for signs bleeding, and monitored hemoglobin. Hemoglobin on day of discharge 7.3 (7.3 -8.0 throughout stay). Patient denied weakness, lightheadedness, and confusion.   HTN Patient intermittently hypertensive. Holding home meds in setting of low normal, but given rising blood pressures will slowly restart home medications and continue to monitor vitals. Patient on amlodipine 5 mg at home. Nephrology increased to Amlodipine 10 mg on 7/10 as diastolic pressures remained elevated.   Chronic back pain Patient given acetaminophen 1000 mg PRN q6h, lidocaine 5% patch, and Dilaudid 1 mg PRN for severe pain. Lidocaine patch has been helpful.   Hypothyroidism Patient remained on levothyroxine 75 mcg daily. No changes to her home dose.     MDD/anxiety Per sister Darel Hong), patient on xanax at home (0.5 mg - 1 mg daily) for anxiety. Started here on atarax 25 mg BID, mirtazapine 7.5 mg daily, and venlafaxine 37.5 mg daily. Patient eating more and demonstrated increased interest in participating in therapy since starting mirtazapine and venlafaxine on 7/12. Patient stating medications have been helpful, especially with her anxiety.     Discharge Subjective: No overnight events reported. Patient sitting up in chair preparing her morning coffee. She was very engaged in conversation. Continues to have cough and shortness of breath with activity. Sputum reported as clear to yellow color. Lidocaine patch on lower back provides pain relief. Patient continues to have discomfort with urination, but denies burning pain. Denies chest pain, N/V, or diarrhea. Discussed importance of outpatient dialysis sessions. Patient feeling improved mood and anxiety on medications started during admission. No other concerns shared by patient.   Discharge Exam:   Blood pressure (!) 129/98, pulse (!) 59, temperature 98.3 F (36.8 C), temperature source Oral, resp. rate 20, height 5\' 2"  (1.575 m), weight 53.1 kg, SpO2 95%.  Constitutional: well-appearing, sitting in chair, in no acute distress HENT: normocephalic atraumatic, mucous membranes moist Cardiovascular: regular rate and rhythm, no m/r/g, intact pulses U/L extremities Pulmonary/Chest: normal work of breathing on 2L [place] at rest, lungs clear to auscultation bilaterally Abdominal: soft, non-tender, non-distended. Positive bowel sounds.  Neurological: Alert & oriented, acknowledged understanding of situation and need for continued dialysis sessions.  MSK: No gross abnormalities.  Skin: Warm and dry Psych: Normal mood and affect  Pertinent Labs, Studies, and Procedures:     Latest Ref Rng & Units 06/09/2023    1:22 AM 06/08/2023   12:46 AM 06/07/2023   12:51 AM  CBC  WBC 4.0 - 10.5 K/uL 9.5  7.2  5.2    Hemoglobin 12.0 - 15.0 g/dL 7.3  7.5  8.0   Hematocrit 36.0 - 46.0 % 23.9  23.8  25.5   Platelets 150 - 400 K/uL 468  449  437        Latest Ref Rng & Units 06/09/2023    3:25 PM 06/09/2023  1:22 AM 06/08/2023    6:29 AM  CMP  Glucose 70 - 99 mg/dL 308  657  846   BUN 8 - 23 mg/dL 57  50  34   Creatinine 0.44 - 1.00 mg/dL 9.62  9.52  8.41   Sodium 135 - 145 mmol/L 134  131  134   Potassium 3.5 - 5.1 mmol/L 5.1  4.4  4.5   Chloride 98 - 111 mmol/L 95  95  95   CO2 22 - 32 mmol/L 23  24  25    Calcium 8.9 - 10.3 mg/dL 8.4  8.0  8.5     US RENAL  Result Date: 05/26/2023 CLINICAL DATA:  77 year old female with acute renal failure. EXAM: RENAL / URINARY TRACT ULTRASOUND COMPLETE COMPARISON:  CT Abdomen and Pelvis 0034 hours today. FINDINGS: Right Kidney: Renal measurements: 9.1 x 4.9 x 4.9 cm = volume: 114 mL. No right hydronephrosis. Maintained corticomedullary differentiation. No right renal mass. Left Kidney: Renal measurements: 9.6 x 5.0 x 5.3 cm = volume: 132 mL. Maintained corticomedullary differentiation. No hydronephrosis or renal mass. Bladder: Bladder decompressed with Foley catheter balloon visible on image 41. Other: None. IMPRESSION: 1. Negative ultrasound appearance of both kidneys. 2. Bladder decompressed by Foley catheter. Electronically Signed   By: Odessa Fleming M.D.   On: 05/26/2023 04:13   CT Head Wo Contrast  Result Date: 05/26/2023 CLINICAL DATA:  Altered mental status and UTI, initial encounter EXAM: CT HEAD WITHOUT CONTRAST TECHNIQUE: Contiguous axial images were obtained from the base of the skull through the vertex without intravenous contrast. RADIATION DOSE REDUCTION: This exam was performed according to the departmental dose-optimization program which includes automated exposure control, adjustment of the mA and/or kV according to patient size and/or use of iterative reconstruction technique. COMPARISON:  None Available. FINDINGS: Brain: No evidence of acute infarction,  hemorrhage, hydrocephalus, extra-axial collection or mass lesion/mass effect. Chronic atrophic and ischemic changes are noted. Vascular: No hyperdense vessel or unexpected calcification. Skull: Normal. Negative for fracture or focal lesion. Sinuses/Orbits: No acute finding. Other: None. IMPRESSION: Chronic atrophic and ischemic changes are noted. No acute abnormality noted. Electronically Signed   By: Alcide Clever M.D.   On: 05/26/2023 01:45   CT ABDOMEN PELVIS WO CONTRAST  Result Date: 05/26/2023 CLINICAL DATA:  Abdominal pain, acute, nonlocalized. Remote fall, persistent pelvic and back pain, EXAM: CT ABDOMEN AND PELVIS WITHOUT CONTRAST TECHNIQUE: Multidetector CT imaging of the abdomen and pelvis was performed following the standard protocol without IV contrast. RADIATION DOSE REDUCTION: This exam was performed according to the departmental dose-optimization program which includes automated exposure control, adjustment of the mA and/or kV according to patient size and/or use of iterative reconstruction technique. COMPARISON:  01/11/2023 FINDINGS: Lower chest: No acute abnormality. Extensive multi-vessel coronary artery calcification. Stable bibasilar fibrotic changes Hepatobiliary: No focal liver abnormality is seen. No gallstones, gallbladder wall thickening, or biliary dilatation. Pancreas: Unremarkable Spleen: Unremarkable Adrenals/Urinary Tract: The adrenal glands are unremarkable. The kidneys are normal in size and position. 2 mm calcification within the interpolar region of the right kidney likely represents a vascular calcification. No urinary renal or ureteral calculi identified. No hydronephrosis. No perinephric fluid collections. The bladder is decompressed with a punctate focus of intraluminal gas possibly related to recent catheterization. Stomach/Bowel: Moderate sigmoid diverticulosis without superimposed acute inflammatory change. The stomach, small bowel, and large bowel are otherwise  unremarkable. The appendix is absent. No free intraperitoneal gas or fluid. Vascular/Lymphatic: Extensive aortoiliac atherosclerotic calcification. No aortic aneurysm. No pathologic  adenopathy within the abdomen and pelvis. Reproductive: Status post hysterectomy. No adnexal masses. Other: No abdominal wall hernia. Coarse calcifications within the subcutaneous fat of the gluteal regions bilaterally may relate to remote trauma or subcutaneous injection. Musculoskeletal: L4-5 lumbar fusion with instrumentation and left L4 hemilaminectomy have been performed. Advanced degenerative changes are seen throughout the visualized thoracolumbar spine. Dorsal column stimulator device is in place with leads extending into the thoracic spine beyond the margin of the examination. No acute bone abnormality. No lytic or blastic bone lesion. IMPRESSION: 1. No acute intra-abdominal pathology identified. No definite radiographic explanation for the patient's reported symptoms. 2. Extensive multi-vessel coronary artery calcification. 3. Moderate sigmoid diverticulosis without superimposed acute inflammatory change. Aortic Atherosclerosis (ICD10-I70.0). Electronically Signed   By: Helyn Numbers M.D.   On: 05/26/2023 01:09   DG Chest Portable 1 View  Result Date: 05/26/2023 CLINICAL DATA:  Chest pain EXAM: PORTABLE CHEST 1 VIEW COMPARISON:  04/09/2023 FINDINGS: Cardiac shadow is stable. Spinal stimulator is again noted. Lungs are well aerated bilaterally. Some chronic scarring is seen particularly in the right base. Mild central vascular congestion is noted without edema. No bony abnormality is seen. IMPRESSION: Mild vascular congestion without edema. Mild scarring in the right lung base. Electronically Signed   By: Alcide Clever M.D.   On: 05/26/2023 00:24     Discharge Instructions: Discharge Instructions     (HEART FAILURE PATIENTS) Call MD:  Anytime you have any of the following symptoms: 1) 3 pound weight gain in 24 hours or  5 pounds in 1 week 2) shortness of breath, with or without a dry hacking cough 3) swelling in the hands, feet or stomach 4) if you have to sleep on extra pillows at night in order to breathe.   Complete by: As directed    Call MD for:   Complete by: As directed    Call MD for:  difficulty breathing, headache or visual disturbances   Complete by: As directed    Call MD for:  extreme fatigue   Complete by: As directed    Call MD for:  hives   Complete by: As directed    Call MD for:  persistant dizziness or light-headedness   Complete by: As directed    Call MD for:  persistant nausea and vomiting   Complete by: As directed    Call MD for:  redness, tenderness, or signs of infection (pain, swelling, redness, odor or green/yellow discharge around incision site)   Complete by: As directed    Call MD for:  severe uncontrolled pain   Complete by: As directed    Call MD for:  temperature >100.4   Complete by: As directed    Diet general   Complete by: As directed    Discharge instructions   Complete by: As directed    Patient Instructions:   - You were seen for an acute kidney injury and lack of urine output. You required hemodialysis to get rid of toxins in your blood while your kidney function improved. You have had six sessions of hemodialysis in the hospital and will continue to get them in the outpatient setting Tuesdays, Thursdays, and Saturdays at [facility] in [place].   - Your first session for hemodialysis is scheduled for Tuesday 7/16 at 10:15 AM. It is VERY important that you go to ALL sessions Tuesdays, Thursdays, and Saturdays and don't miss a session while your kidneys continue to recover.   - You were also seen  for an infection in your lungs. We have been giving you two antibiotics, one by mouth and one in your IV, and the course for those antibiotics will be finished today 7/15. You had increased cough, mucus/spit-up, and shortness of breath, which we believe is  related to your history as a cigarette smoker. We treated you with supplemental oxygen, a 5-day course of steroids that will finish 7/16, and inhalers. You will be going home with supplemental oxygen. We have engaged social work to make sure that your inhalers are affordable.   - Please follow up with your PCP Dr. Juleen China, in the next 7-10 days so that she can review labs and assess how you are doing. This will be up to you to set up.   - Please call us at [phone] from 8 AM - 5 PM if you have any questions or concerns. Please call us if you are feeling chest pain, shortness of breath, fever, and have trouble keeping any food or liquid down. If it is an emergency, please call 911.   It was a pleasure serving you! - Dr. Denton Brick   Face-to-face encounter (required for Medicare/Medicaid patients)   Complete by: As directed    I Morene Crocker certify that this patient is under my care and that I, or a nurse practitioner or physician's assistant working with me, had a face-to-face encounter that meets the physician face-to-face encounter requirements with this patient on 06/08/2023. The encounter with the patient was in whole, or in part for the following medical condition(s) which is the primary reason for home health care (List medical condition): Patient with physical deconditioning secondary to acute kidney injury and pulmonary edema, now improving and with need of outpatient hemodialysis needing PT and OT at home  Face to face   The encounter with the patient was in whole, or in part, for the following medical condition, which is the primary reason for home health care: Acute kidney injury with physical deconditioning   I certify that, based on my findings, the following services are medically necessary home health services: Physical therapy   Reason for Medically Necessary Home Health Services: Therapy- Home Adaptation to Facilitate Safety   My clinical findings support the  need for the above services:  Unable to leave home safely without assistance and/or assistive device Shortness of breath with activity Unsafe ambulation due to balance issues     Further, I certify that my clinical findings support that this patient is homebound due to:  Unable to leave home safely without assistance Shortness of Breath with activity     Home Health   Complete by: As directed    Patient with physical deconditioning needing PT and OT at home Face to face   To provide the following care/treatments:  PT OT     Increase activity slowly   Complete by: As directed    No wound care   Complete by: As directed        Signed: Dontrail Blackwell Colbert Coyer, MD [facility] - PGY1 Pager: [phone] 06/09/2023, 7:16 PM    Please contact the on call pager after 5 pm and on weekends at [phone].

## 2023-06-09 NOTE — TOC Benefit Eligibility Note (Addendum)
Pharmacy Patient Advocate Encounter  Insurance verification completed.    The patient is insured through [employer] Part D  Ran test claim for Lokelma 10 g and the current 30 day co-pay is $95.00.  Ran test claim for Anoro Ellipta 62.5-25 mcg/act and the current 30 day co-pay is $95.00.  Ran test claim for Breo Ellipta 100-25 mcg/act and the current 30 day co-pay is $45.00.  Ran test claim for sodium polystyrene powder and the current 30 day co-pay is $11.55.  Ran test claim for hydroxyzine 25 mg tablets and the current 30 day co-pay is $3.27.  Ran test claim for venlafaxine XR 37.5 mg and the current 30 day co-pay is $0.00.  Ran test claim for mirtazapine 7.5 mg and the current 30 day co-pay is $0.00.  Ran test claim for [employer] 9-4.8 mcg/act and the current 30 day co-pay is $95.00.  Ran test claim for Stiolto Respimat 2.5-2.5 mcg/act and the current 30 day co-pay is $45.00.  This test claim was processed through [facility]- copay amounts may vary at other pharmacies due to pharmacy/plan contracts, or as the patient moves through the different stages of their insurance plan.    Roland Earl, CPHT Pharmacy Patient Advocate Specialist [facility] Patient Advocate Team Direct Number: [phone]  Fax: [phone]

## 2023-06-09 NOTE — Progress Notes (Signed)
This chaplain responded to PMT PA-Josseline's consult for creating/updating the Pt. Advance Directive:  HCPOA and Living Will. The Pt. is awake and sitting in the bedside recliner.  The Pt. participated in AD education and answered the chaplain's clarifying questions. The Pt. filled out the AD and requested a notary visit.  The chaplain is present with the Pt., notary, and witnesses for the notarizing of the Pt. AD.    The Pt. named Adele Schilder as HCPOA. If this person is unable or unwilling to serve as the HCPOA the Pt. next choice is Iverson Alamin.  The chaplain gave the Pt. the original AD along with two copies.  The chaplain scanned the Pt. AD into the Pt. EMR.  This chaplain is available for F/U spiritual care as needed.  Chaplain Stephanie Acre [phone]
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
## 2023-06-09 NOTE — Progress Notes (Signed)
Patient ID: Samantha Fletcher, female   DOB: 1946-09-03, 76 y.o.   MRN: [id number] S:  0.15L UOP yesterday Plan is [facility] outpatient in [facility], can start tomoroww 7/16 No c/o this AM Change in SCr and BUN not consistent with recovering GFR; K 4.4  O:BP (!) 149/88 (BP Location: Left Arm)   Pulse 69   Temp 98.3 F (36.8 C) (Oral)   Resp 20   Ht 5\' 2"  (1.575 m)   Wt 53.1 kg   LMP  (LMP Unknown)   SpO2 93%   BMI 21.41 kg/m   Intake/Output Summary (Last 24 hours) at 06/09/2023 1122 Last data filed at 06/08/2023 1700 Gross per 24 hour  Intake 480 ml  Output 150 ml  Net 330 ml    Gen: NAD in chair conversant CVS: RRR Resp:dec BS bases, normal WOB at rest  Abd:+BS, soft, NT/nD  Ext: no edema RIJ TDC c/d/i  Recent Labs  Lab 06/04/23 0054 06/05/23 0059 06/06/23 0056 06/07/23 0051 06/08/23 0046 06/08/23 0629 06/09/23 0122  NA 132* 136 135 132* 132* 134* 131*  K 4.8 3.7 3.9 4.2 4.4 4.5 4.4  CL 96* 96* 97* 94* 95* 95* 95*  CO2 24 26 24 26 24 25 24   GLUCOSE 87 114* 87 131* 138* 100* 146*  BUN 31* 19 29* 18 31* 34* 50*  CREATININE 4.82* 3.27* 4.64* 3.15* 4.30* 4.45* 5.59*  ALBUMIN 2.1* 2.1* 2.0* 2.2* 2.4* 2.5* 2.6*  CALCIUM 8.1* 8.0* 8.0* 8.4* 8.3* 8.5* 8.0*  PHOS 4.9* 3.2 4.6 3.6 3.7 4.1 3.7   Liver Function Tests: Recent Labs  Lab 06/08/23 0046 06/08/23 0629 06/09/23 0122  ALBUMIN 2.4* 2.5* 2.6*   No results for input(s): "LIPASE", "AMYLASE" in the last 168 hours.  No results for input(s): "AMMONIA" in the last 168 hours. CBC: Recent Labs  Lab 06/05/23 0059 06/06/23 0056 06/07/23 0051 06/08/23 0046 06/09/23 0122  WBC 10.6* 8.6 5.2 7.2 9.5  NEUTROABS 8.6* 6.3  --   --   --   HGB 7.9* 7.5* 8.0* 7.5* 7.3*  HCT 24.6* 23.8* 25.5* 23.8* 23.9*  MCV 85.7 86.9 87.3 89.1 87.5  PLT 424* 406* 437* 449* 468*   Cardiac Enzymes: No results for input(s): "CKTOTAL", "CKMB", "CKMBINDEX", "TROPONINI" in the last 168 hours. CBG: No results for input(s): "GLUCAP" in the  last 168 hours.  Iron Studies:  No results for input(s): "IRON", "TIBC", "TRANSFERRIN", "FERRITIN" in the last 72 hours.   Studies/Results: No results found.  albuterol  2.5 mg Nebulization BID   amLODipine  10 mg Oral Daily   azithromycin  500 mg Oral Daily   Chlorhexidine Gluconate Cloth  6 each Topical Q0600   darbepoetin (ARANESP) injection - DIALYSIS  60 mcg Subcutaneous Q Mon-1800   heparin injection (subcutaneous)  5,000 Units Subcutaneous Q8H   hydrocortisone cream   Topical BID   hydrOXYzine  25 mg Oral BID   iron polysaccharides  150 mg Oral Daily   levothyroxine  75 mcg Oral Q0600   lidocaine  1 patch Transdermal Q24H   lidocaine  1 Application Urethral Daily   lidocaine-EPINEPHrine  10 mL Infiltration Once   melatonin  3 mg Oral QHS   mirtazapine  7.5 mg Oral QHS   pantoprazole  40 mg Oral Daily   polyethylene glycol  17 g Oral Daily   predniSONE  40 mg Oral Q breakfast   senna-docusate  2 tablet Oral BID   sodium zirconium cyclosilicate  10 g Oral Daily  umeclidinium-vilanterol  1 puff Inhalation Daily   venlafaxine XR  37.5 mg Oral Q breakfast    BMET    Component Value Date/Time   NA 131 (L) 06/09/2023 0122   K 4.4 06/09/2023 0122   CL 95 (L) 06/09/2023 0122   CO2 24 06/09/2023 0122   GLUCOSE 146 (H) 06/09/2023 0122   BUN 50 (H) 06/09/2023 0122   CREATININE 5.59 (H) 06/09/2023 0122   CALCIUM 8.0 (L) 06/09/2023 0122   GFRNONAA 7 (L) 06/09/2023 0122   GFRAA >60 10/10/2016 0556   CBC    Component Value Date/Time   WBC 9.5 06/09/2023 0122   RBC 2.73 (L) 06/09/2023 0122   HGB 7.3 (L) 06/09/2023 0122   HCT 23.9 (L) 06/09/2023 0122   PLT 468 (H) 06/09/2023 0122   MCV 87.5 06/09/2023 0122   MCH 26.7 06/09/2023 0122   MCHC 30.5 06/09/2023 0122   RDW 16.5 (H) 06/09/2023 0122   LYMPHSABS 1.3 06/06/2023 0056   MONOABS 0.6 06/06/2023 0056   EOSABS 0.3 06/06/2023 0056   BASOSABS 0.1 06/06/2023 0056     Assessment/Plan:  AKI, oliguric - Scr was  normal in May.  By history this is most consistent with ischemic ATN in setting of N/V for the past week with concomitant ARB therapy.  No evidence of cortical necrosis on renal US, no NSAID use, time course too short for AIN, no obstruction.  Negative ANA, dsDNA, ASO, ANCA, and normal complements.  She had TDC placed 05/28/23 by IR, s/p first HD session 05/29/23.  Remains oliguric  Continue daily labs and assessment for HD needs --> intradialytic Cr rise suggests no recovery yet.  Has HD arranged outpt starting Tues 7/16 at [place] with plans to d/c ? Today -- I'm ok with this plan.  Placed PPD 7/11. Given normal baseline function still hopeful for recovery.   Avoid nephrotoxic medications including NSAIDs and iodinated intravenous contrast exposure unless the latter is absolutely indicated.  Preferred narcotic agents for pain control are hydromorphone, fentanyl, and methadone. Morphine should not be used. Avoid Baclofen and avoid oral sodium phosphate and magnesium citrate based laxatives / bowel preps. Continue strict Input and Output monitoring. Will monitor the patient closely with you and intervene or adjust therapy as indicated by changes in clinical status/labs  HTN- losartan on hold with AKI,  on amlodipine, UF with HD.  Low back pain - chronic Abdominal pain and persistent nausea - CT of abd/pelvis without source of symptoms and seems to be improved somewhat.  Per primary.  COPD - per primary svc; CXR this wek with persistent effusions and opacities ? Bronchitis.  UF with HD didn't help, doesn't appear pulm edema contributing. Normocytic anemia - TSAT 21%, dosed with IV iron and follow.  Transfused 1 unit PRBC 05/30/23 and 7-8s now. ESA qMon, further mgmt as outpt  Has outpt HD arranged for TTS at [place] to start Tues - ok for d/c from nephrology perspective.  WIll continue to follow while in to assess for any AKI recovery, continuing daily labs for now.   Arita Miss, MD  [facility]

## 2023-06-09 NOTE — Progress Notes (Addendum)
Physical Therapy Treatment Patient Details Name: Samantha Fletcher MRN: [id number] DOB: [birth year] Today's Date: 06/09/2023   History of Present Illness 77 yo female admitted 05/25/23 with AKI, UTI, decreased PT intact with N&V.  She has been anuric now with foley.  Elevated troponins as well with concern for NSTEMI.  PMHx:  anxiety, depression, memory changes, COPD, CKD3a, hypothyroidism, HTN, chronic pain, PAF, hyponatremia, CVA, weakness, fibromyalgia, OSA not on CPAP and vertigo.    PT Comments  Pt admitted with above diagnosis. Pt met 5/5 goals set and goals revised.  Pt also has progressed fairly well and D/C plan updated to home with HHPT, HHOT and son and sister to assist pt at home. Pt will need home O2 as she does desaturate on RA and with activity. Will continue to follow acutely.  Pt currently with functional limitations due to the deficits listed below (see PT Problem List). Pt will benefit from acute skilled PT to increase their independence and safety with mobility to allow discharge.      SATURATION QUALIFICATIONS: (This note is used to comply with regulatory documentation for home oxygen)  Patient Saturations on Room Air at Rest = 82%, Needs 4L to maintain O2 saturation above 88% at REst  Patient Saturations on Room Air while Ambulating = NT as desaturates on RA at rest  Patient Saturations on 6 Liters of oxygen while Ambulating = 89%  Please briefly explain why patient needs home oxygen:Pt needing 4LO2 at rest and 6LO2 with activity to keep sats >88%.    Assistance Recommended at Discharge Intermittent Supervision/Assistance  If plan is discharge home, recommend the following:  Can travel by private vehicle    Direct supervision/assist for medications management;Direct supervision/assist for financial management;Assist for transportation;Assistance with cooking/housework;A little help with walking and/or transfers;A little help with bathing/dressing/bathroom   No  Equipment  Recommendations  Rollator (4 wheels);Other (comment) (home O2)    Recommendations for Other Services       Precautions / Restrictions Precautions Precautions: Fall Precaution Comments: hx of multiple falls Restrictions Weight Bearing Restrictions: No     Mobility  Bed Mobility               General bed mobility comments: Pt in chair on arrival.    Transfers Overall transfer level: Needs assistance Equipment used: Rolling walker (2 wheels) Transfers: Sit to/from Stand Sit to Stand: Supervision           General transfer comment: Pt did not need physical assist to rise from chair or bed.She also had proper hand placement.    Ambulation/Gait Ambulation/Gait assistance: Supervision Gait Distance (Feet): 25 Feet (walked 15 feet and then 25 feet) Assistive device: Rolling walker (2 wheels) Gait Pattern/deviations: Step-to pattern, Decreased step length - right, Decreased step length - left, Decreased stride length, Decreased dorsiflexion - left, Decreased dorsiflexion - right, Antalgic, Drifts right/left, Trunk flexed, Wide base of support   Gait velocity interpretation: 1.31 - 2.62 ft/sec, indicative of limited community ambulator   General Gait Details: Pt had difficulty standing upright with flexted posture even with RW use. Pt flexed and needed cues to stand upright. Pt with improved coordination of bil LEs, right poorer than left.  Pt needed incr time to ambulate with better safety than when last seen. Occasional cues to stay close to rW. See O2 note for O2 saturation and O2 needs.   Stairs             Armed forces technical officer  Bed    Modified Rankin (Stroke Patients Only)       Balance Overall balance assessment: History of Falls, Needs assistance Sitting-balance support: No upper extremity supported, Feet supported Sitting balance-Leahy Scale: Fair     Standing balance support: Bilateral upper extremity supported, During functional  activity Standing balance-Leahy Scale: Poor Standing balance comment: Relies on UE support for balance and can stand statically with RW and move dynamically with RW with supervision                            Cognition Arousal/Alertness: Awake/alert Behavior During Therapy: WFL for tasks assessed/performed Overall Cognitive Status: No family/caregiver present to determine baseline cognitive functioning                                 General Comments: pt with hx of dementia, poor memory and awareness of deficits        Exercises General Exercises - Lower Extremity Ankle Circles/Pumps: AROM, Both, 10 reps, Supine Heel Slides: AROM, Both, 10 reps, Supine    General Comments        Pertinent Vitals/Pain Pain Assessment Pain Assessment: Faces Faces Pain Scale: Hurts little more Pain Location: back and knees Pain Descriptors / Indicators: Discomfort, Grimacing, Guarding Pain Intervention(s): Limited activity within patient's tolerance, Monitored during session, Repositioned    Home Living                          Prior Function            PT Goals (current goals can now be found in the care plan section) Acute Rehab PT Goals PT Goal Formulation: With patient Time For Goal Achievement: 06/23/23 Potential to Achieve Goals: Fair Progress towards PT goals: Progressing toward goals    Frequency    Min 1X/week      PT Plan Discharge plan needs to be updated    Co-evaluation              AM-PAC PT "6 Clicks" Mobility   Outcome Measure  Help needed turning from your back to your side while in a flat bed without using bedrails?: None Help needed moving from lying on your back to sitting on the side of a flat bed without using bedrails?: None Help needed moving to and from a bed to a chair (including a wheelchair)?: A Little Help needed standing up from a chair using your arms (e.g., wheelchair or bedside chair)?: A  Little Help needed to walk in hospital room?: A Little Help needed climbing 3-5 steps with a railing? : A Lot 6 Click Score: 19    End of Session Equipment Utilized During Treatment: Gait belt;Oxygen Activity Tolerance: Patient limited by fatigue Patient left: with call bell/phone within reach;with family/visitor present;in chair;with chair alarm set Nurse Communication: Mobility status PT Visit Diagnosis: Repeated falls (R29.6);Muscle weakness (generalized) (M62.81);Dizziness and giddiness (R42);Other abnormalities of gait and mobility (R26.89)     Time: 1015-1040 PT Time Calculation (min) (ACUTE ONLY): 25 min  Charges:    $Gait Training: 8-22 mins $Therapeutic Exercise: 8-22 mins PT General Charges $$ ACUTE PT VISIT: 1 Visit                     Marcos Ruelas M,PT Acute Rehab Services [phone]    [name] 06/09/2023, 11:30 AM

## 2023-06-09 NOTE — Progress Notes (Signed)
Patient ID: Samantha Fletcher, female   DOB: November 19, 1946, 77 y.o.   MRN: [id number]  Oxygen saturation on room air at rest 90%. Oxygen saturation on room air during ambulation 86%. Oxygen saturation on 2L O2 at rest 95%. Oxygen saturation on 2L O2 during ambulation 92%.  Lidia Collum, RN

## 2023-06-09 NOTE — Progress Notes (Addendum)
Spoke to clinic manager at [facility] this am. Clinic confirms schedule and that pt can start tomorrow. Will advise clinic of pt's d/c date once confirmed. Will assist as needed.   Olivia Canter Renal Navigator [phone]  Addendum at 1:24 pm: Clinic now requesting a TB skin test or chest x-ray that says "no evidence of TB." Pt's RN cannot locate any documentation where TB skin test was read on Saturday. Update provided to team and nephrologist has ordered a chest xray. RN aware not to allow pt to leave until this is resolved.   Addendum at 4:05 pm: Spoke to clinic Production designer, theatre/television/film at [facility]. Clinic advised that plan remains for pt to d/c today after chest x-ray completed and to start tomorrow as planned. Will fax chest xray results and d/c summary to clinic once available for continuation of care. Pt's RN advised to please not allow pt to leave until chest xray has been completed.

## 2023-06-09 NOTE — Plan of Care (Signed)
Patient ID: Samantha Fletcher, female   DOB: Oct 21, 1946, 77 y.o.   MRN: [id number]  Problem: Education: Goal: Knowledge of General Education information will improve Description: Including pain rating scale, medication(s)/side effects and non-pharmacologic comfort measures Outcome: Adequate for Discharge   Problem: Health Behavior/Discharge Planning: Goal: Ability to manage health-related needs will improve Outcome: Adequate for Discharge   Problem: Clinical Measurements: Goal: Ability to maintain clinical measurements within normal limits will improve Outcome: Adequate for Discharge Goal: Will remain free from infection Outcome: Adequate for Discharge Goal: Diagnostic test results will improve Outcome: Adequate for Discharge Goal: Respiratory complications will improve Outcome: Adequate for Discharge Goal: Cardiovascular complication will be avoided Outcome: Adequate for Discharge   Problem: Activity: Goal: Risk for activity intolerance will decrease Outcome: Adequate for Discharge   Problem: Nutrition: Goal: Adequate nutrition will be maintained Outcome: Adequate for Discharge   Problem: Coping: Goal: Level of anxiety will decrease Outcome: Adequate for Discharge   Problem: Elimination: Goal: Will not experience complications related to bowel motility Outcome: Adequate for Discharge Goal: Will not experience complications related to urinary retention Outcome: Adequate for Discharge   Problem: Pain Managment: Goal: General experience of comfort will improve Outcome: Adequate for Discharge   Problem: Safety: Goal: Ability to remain free from injury will improve Outcome: Adequate for Discharge   Problem: Skin Integrity: Goal: Risk for impaired skin integrity will decrease Outcome: Adequate for Discharge   Problem: Acute Rehab PT Goals(only PT should resolve) Goal: Pt Will Go Supine/Side To Sit Outcome: Adequate for Discharge Goal: Patient Will Perform Sitting  Balance Outcome: Adequate for Discharge Goal: Patient Will Transfer Sit To/From Stand Outcome: Adequate for Discharge Goal: Pt Will Transfer Bed To Chair/Chair To Bed Outcome: Adequate for Discharge Goal: Pt Will Ambulate Outcome: Adequate for Discharge   Problem: Acute Rehab OT Goals (only OT should resolve) Goal: Pt. Will Perform Grooming Outcome: Adequate for Discharge Goal: Pt. Will Perform Lower Body Bathing Outcome: Adequate for Discharge Goal: Pt. Will Perform Lower Body Dressing Outcome: Adequate for Discharge Goal: Pt. Will Transfer To Toilet Outcome: Adequate for Discharge Goal: Pt. Will Perform Toileting-Clothing Manipulation Outcome: Adequate for Discharge  Lidia Collum, RN

## 2023-06-09 NOTE — TOC Transition Note (Addendum)
Transition of Care [facility]) - CM/SW Discharge Note   Patient Details  Name: Samantha Fletcher MRN: [id number] Date of Birth: 03/27/1946  [facility] of [facility] CM/SW Contact:  Leone Haven, RN Phone Number: 06/09/2023, 12:20 PM   Clinical Narrative:    Patient is for dc home today, patient is very weak, she will need home oxygen, she states she is too weak to use the green tanks to roll she will need the pocket book portable oxygen,  NCM made referral to Frances Nickels with [employer]. She is getting patient set up with the portable oxygen now and will get the concentrator set up at patient 's home today.  She will need ptar transport.  Also she will need a rollator, Jermaine with [employer] will supply the rollator for patient at her home address.  Patient also states her son will transport her to OP HD. Will schedule PTAR for later today to give [employer] rep time to bring portable to patient room.  MD wants to see if there are any resources for inhalers brio ellipta and albuterol , this NCM will check. NCM checked with [employer] for any discounts they may have, or coupons.the Rep Para March states to call [phone] voucher program  For breo ellipta. Spoke with Morrie Sheldon she states they no longer carry a coupon or copay cards for this particular medication any longer.  Per Frances Nickels , patient has [facility] insurance, so they can not do the referral for oxygen for her, she will need to go thru [employer],  NCM made referral to [facility] with [employer] for home oxygen and the rollator.  This will be brought to patient's room.  Patient states ,her sister Darel Hong will transport her home at dc.  Marthann Schiller states they will not be able to do the small portable oxygen for her because it may not give her what she needs, but they can do two etanks for her.  He will bring up to room. Sister is in the room and states she will take her to HD tomorrow, but the son will have to pick her up, she states they are working out the transportation.      Barriers to Discharge: Continued Medical Work up   Patient Goals and CMS Choice   Choice offered to / list presented to : NA  Discharge Placement                         Discharge Plan and Services Additional resources added to the After Visit Summary for   In-house Referral: NA Discharge Planning Services: CM Consult Post Acute Care Choice: NA          DME Arranged: N/A DME Agency: NA       HH Arranged: NA          Social Determinants of Health (SDOH) Interventions SDOH Screenings   Food Insecurity: No Food Insecurity (05/26/2023)  Housing: Low Risk  (05/26/2023)  Transportation Needs: No Transportation Needs (05/26/2023)  Utilities: Not At Risk (05/26/2023)  Financial Resource Strain: Low Risk  (05/14/2022)   Received from [facility], [facility]  Tobacco Use: Medium Risk (05/26/2023)     Readmission Risk Interventions     No data to display

## 2023-06-09 NOTE — Care Management Important Message (Signed)
Important Message  Patient Details  Name: Samantha Fletcher MRN: [id number] Date of Birth: 1946-01-27   Medicare Important Message Given:  Yes     Sherilyn Banker 06/09/2023, 4:32 PM

## 2023-06-10 DIAGNOSIS — N179 Acute kidney failure, unspecified: Secondary | ICD-10-CM | POA: Diagnosis not present

## 2023-06-10 DIAGNOSIS — I517 Cardiomegaly: Secondary | ICD-10-CM | POA: Diagnosis not present

## 2023-06-10 DIAGNOSIS — J441 Chronic obstructive pulmonary disease with (acute) exacerbation: Secondary | ICD-10-CM | POA: Diagnosis not present

## 2023-06-10 DIAGNOSIS — Z992 Dependence on renal dialysis: Secondary | ICD-10-CM | POA: Diagnosis not present

## 2023-06-10 DIAGNOSIS — D649 Anemia, unspecified: Secondary | ICD-10-CM | POA: Diagnosis not present

## 2023-06-10 DIAGNOSIS — R0689 Other abnormalities of breathing: Secondary | ICD-10-CM | POA: Diagnosis not present

## 2023-06-10 DIAGNOSIS — I5033 Acute on chronic diastolic (congestive) heart failure: Secondary | ICD-10-CM | POA: Diagnosis not present

## 2023-06-10 DIAGNOSIS — I130 Hypertensive heart and chronic kidney disease with heart failure and stage 1 through stage 4 chronic kidney disease, or unspecified chronic kidney disease: Secondary | ICD-10-CM | POA: Diagnosis not present

## 2023-06-10 DIAGNOSIS — Z743 Need for continuous supervision: Secondary | ICD-10-CM | POA: Diagnosis not present

## 2023-06-10 DIAGNOSIS — D509 Iron deficiency anemia, unspecified: Secondary | ICD-10-CM | POA: Diagnosis not present

## 2023-06-10 DIAGNOSIS — J811 Chronic pulmonary edema: Secondary | ICD-10-CM | POA: Diagnosis not present

## 2023-06-10 DIAGNOSIS — J9601 Acute respiratory failure with hypoxia: Secondary | ICD-10-CM | POA: Diagnosis not present

## 2023-06-10 DIAGNOSIS — I700 Atherosclerosis of aorta: Secondary | ICD-10-CM | POA: Diagnosis not present

## 2023-06-10 DIAGNOSIS — I4891 Unspecified atrial fibrillation: Secondary | ICD-10-CM | POA: Diagnosis not present

## 2023-06-10 DIAGNOSIS — J440 Chronic obstructive pulmonary disease with acute lower respiratory infection: Secondary | ICD-10-CM | POA: Diagnosis not present

## 2023-06-10 DIAGNOSIS — R918 Other nonspecific abnormal finding of lung field: Secondary | ICD-10-CM | POA: Diagnosis not present

## 2023-06-10 DIAGNOSIS — J189 Pneumonia, unspecified organism: Secondary | ICD-10-CM | POA: Diagnosis not present

## 2023-06-10 DIAGNOSIS — F0393 Unspecified dementia, unspecified severity, with mood disturbance: Secondary | ICD-10-CM | POA: Diagnosis not present

## 2023-06-10 DIAGNOSIS — R0603 Acute respiratory distress: Secondary | ICD-10-CM | POA: Diagnosis not present

## 2023-06-10 DIAGNOSIS — I509 Heart failure, unspecified: Secondary | ICD-10-CM | POA: Diagnosis not present

## 2023-06-10 DIAGNOSIS — Z452 Encounter for adjustment and management of vascular access device: Secondary | ICD-10-CM | POA: Diagnosis not present

## 2023-06-10 DIAGNOSIS — R079 Chest pain, unspecified: Secondary | ICD-10-CM | POA: Diagnosis not present

## 2023-06-10 DIAGNOSIS — J90 Pleural effusion, not elsewhere classified: Secondary | ICD-10-CM | POA: Diagnosis not present

## 2023-06-10 DIAGNOSIS — I499 Cardiac arrhythmia, unspecified: Secondary | ICD-10-CM | POA: Diagnosis not present

## 2023-06-10 DIAGNOSIS — R069 Unspecified abnormalities of breathing: Secondary | ICD-10-CM | POA: Diagnosis not present

## 2023-06-10 LAB — CULTURE, BLOOD (ROUTINE X 2): Culture: NO GROWTH

## 2023-06-10 NOTE — Progress Notes (Signed)
Late Note Entry  D/C summary and most recent chest x-ray faxed to [facility] this morning. Spoke to clinic manager to make sure fax was received and chest x-ray was acceptable. Pt to start at clinic today.   Olivia Canter Renal Navigator [phone]

## 2023-06-12 ENCOUNTER — Ambulatory Visit: Payer: Medicare Other | Admitting: Physician Assistant

## 2023-06-19 DIAGNOSIS — N179 Acute kidney failure, unspecified: Secondary | ICD-10-CM | POA: Diagnosis not present

## 2023-06-19 DIAGNOSIS — Z992 Dependence on renal dialysis: Secondary | ICD-10-CM | POA: Diagnosis not present

## 2023-06-19 DIAGNOSIS — D509 Iron deficiency anemia, unspecified: Secondary | ICD-10-CM | POA: Diagnosis not present

## 2023-06-20 DIAGNOSIS — I130 Hypertensive heart and chronic kidney disease with heart failure and stage 1 through stage 4 chronic kidney disease, or unspecified chronic kidney disease: Secondary | ICD-10-CM | POA: Diagnosis not present

## 2023-06-20 DIAGNOSIS — N179 Acute kidney failure, unspecified: Secondary | ICD-10-CM | POA: Diagnosis not present

## 2023-06-20 DIAGNOSIS — J440 Chronic obstructive pulmonary disease with acute lower respiratory infection: Secondary | ICD-10-CM | POA: Diagnosis not present

## 2023-06-20 DIAGNOSIS — J189 Pneumonia, unspecified organism: Secondary | ICD-10-CM | POA: Diagnosis not present

## 2023-06-20 DIAGNOSIS — J441 Chronic obstructive pulmonary disease with (acute) exacerbation: Secondary | ICD-10-CM | POA: Diagnosis not present

## 2023-06-20 DIAGNOSIS — D509 Iron deficiency anemia, unspecified: Secondary | ICD-10-CM | POA: Diagnosis not present

## 2023-06-20 DIAGNOSIS — N1831 Chronic kidney disease, stage 3a: Secondary | ICD-10-CM | POA: Diagnosis not present

## 2023-06-20 DIAGNOSIS — I5033 Acute on chronic diastolic (congestive) heart failure: Secondary | ICD-10-CM | POA: Diagnosis not present

## 2023-06-20 DIAGNOSIS — I4891 Unspecified atrial fibrillation: Secondary | ICD-10-CM | POA: Diagnosis not present

## 2023-06-21 DIAGNOSIS — Z992 Dependence on renal dialysis: Secondary | ICD-10-CM | POA: Diagnosis not present

## 2023-06-21 DIAGNOSIS — N179 Acute kidney failure, unspecified: Secondary | ICD-10-CM | POA: Diagnosis not present

## 2023-06-21 DIAGNOSIS — D509 Iron deficiency anemia, unspecified: Secondary | ICD-10-CM | POA: Diagnosis not present

## 2023-06-22 DIAGNOSIS — I4891 Unspecified atrial fibrillation: Secondary | ICD-10-CM | POA: Diagnosis not present

## 2023-06-22 DIAGNOSIS — A419 Sepsis, unspecified organism: Secondary | ICD-10-CM | POA: Diagnosis not present

## 2023-06-22 DIAGNOSIS — J189 Pneumonia, unspecified organism: Secondary | ICD-10-CM | POA: Diagnosis not present

## 2023-06-22 DIAGNOSIS — R590 Localized enlarged lymph nodes: Secondary | ICD-10-CM | POA: Diagnosis not present

## 2023-06-22 DIAGNOSIS — R0602 Shortness of breath: Secondary | ICD-10-CM | POA: Diagnosis not present

## 2023-06-22 DIAGNOSIS — J441 Chronic obstructive pulmonary disease with (acute) exacerbation: Secondary | ICD-10-CM | POA: Diagnosis not present

## 2023-06-22 DIAGNOSIS — R531 Weakness: Secondary | ICD-10-CM | POA: Diagnosis not present

## 2023-06-22 DIAGNOSIS — Z7401 Bed confinement status: Secondary | ICD-10-CM | POA: Diagnosis not present

## 2023-06-22 DIAGNOSIS — I120 Hypertensive chronic kidney disease with stage 5 chronic kidney disease or end stage renal disease: Secondary | ICD-10-CM | POA: Diagnosis not present

## 2023-06-22 DIAGNOSIS — J9601 Acute respiratory failure with hypoxia: Secondary | ICD-10-CM | POA: Diagnosis not present

## 2023-06-22 DIAGNOSIS — N186 End stage renal disease: Secondary | ICD-10-CM | POA: Diagnosis not present

## 2023-06-22 DIAGNOSIS — Z743 Need for continuous supervision: Secondary | ICD-10-CM | POA: Diagnosis not present

## 2023-06-22 DIAGNOSIS — J90 Pleural effusion, not elsewhere classified: Secondary | ICD-10-CM | POA: Diagnosis not present

## 2023-06-22 DIAGNOSIS — D631 Anemia in chronic kidney disease: Secondary | ICD-10-CM | POA: Diagnosis not present

## 2023-06-22 DIAGNOSIS — R918 Other nonspecific abnormal finding of lung field: Secondary | ICD-10-CM | POA: Diagnosis not present

## 2023-06-22 DIAGNOSIS — J440 Chronic obstructive pulmonary disease with acute lower respiratory infection: Secondary | ICD-10-CM | POA: Diagnosis not present

## 2023-06-22 DIAGNOSIS — T8249XA Other complication of vascular dialysis catheter, initial encounter: Secondary | ICD-10-CM | POA: Diagnosis not present

## 2023-06-22 DIAGNOSIS — Z452 Encounter for adjustment and management of vascular access device: Secondary | ICD-10-CM | POA: Diagnosis not present

## 2023-06-22 DIAGNOSIS — J9620 Acute and chronic respiratory failure, unspecified whether with hypoxia or hypercapnia: Secondary | ICD-10-CM | POA: Diagnosis not present

## 2023-06-22 DIAGNOSIS — E876 Hypokalemia: Secondary | ICD-10-CM | POA: Diagnosis not present

## 2023-06-22 DIAGNOSIS — J449 Chronic obstructive pulmonary disease, unspecified: Secondary | ICD-10-CM | POA: Diagnosis not present

## 2023-06-23 ENCOUNTER — Ambulatory Visit: Payer: BC Managed Care – PPO

## 2023-06-23 ENCOUNTER — Institutional Professional Consult (permissible substitution): Payer: Medicare Other | Admitting: Psychology

## 2023-06-23 DIAGNOSIS — J189 Pneumonia, unspecified organism: Secondary | ICD-10-CM | POA: Diagnosis not present

## 2023-06-23 DIAGNOSIS — A419 Sepsis, unspecified organism: Secondary | ICD-10-CM | POA: Diagnosis not present

## 2023-06-23 DIAGNOSIS — J9601 Acute respiratory failure with hypoxia: Secondary | ICD-10-CM | POA: Diagnosis not present

## 2023-06-23 DIAGNOSIS — N186 End stage renal disease: Secondary | ICD-10-CM | POA: Diagnosis not present

## 2023-06-23 DIAGNOSIS — D631 Anemia in chronic kidney disease: Secondary | ICD-10-CM | POA: Diagnosis not present

## 2023-06-24 DIAGNOSIS — J189 Pneumonia, unspecified organism: Secondary | ICD-10-CM | POA: Diagnosis not present

## 2023-06-24 DIAGNOSIS — A419 Sepsis, unspecified organism: Secondary | ICD-10-CM | POA: Diagnosis not present

## 2023-06-24 DIAGNOSIS — N186 End stage renal disease: Secondary | ICD-10-CM | POA: Diagnosis not present

## 2023-06-24 DIAGNOSIS — D631 Anemia in chronic kidney disease: Secondary | ICD-10-CM | POA: Diagnosis not present

## 2023-06-24 DIAGNOSIS — J9601 Acute respiratory failure with hypoxia: Secondary | ICD-10-CM | POA: Diagnosis not present

## 2023-06-25 DIAGNOSIS — J189 Pneumonia, unspecified organism: Secondary | ICD-10-CM | POA: Diagnosis not present

## 2023-06-25 DIAGNOSIS — N186 End stage renal disease: Secondary | ICD-10-CM | POA: Diagnosis not present

## 2023-06-25 DIAGNOSIS — D631 Anemia in chronic kidney disease: Secondary | ICD-10-CM | POA: Diagnosis not present

## 2023-06-25 DIAGNOSIS — J9601 Acute respiratory failure with hypoxia: Secondary | ICD-10-CM | POA: Diagnosis not present

## 2023-06-25 DIAGNOSIS — A419 Sepsis, unspecified organism: Secondary | ICD-10-CM | POA: Diagnosis not present

## 2023-06-26 DIAGNOSIS — A419 Sepsis, unspecified organism: Secondary | ICD-10-CM | POA: Diagnosis not present

## 2023-06-26 DIAGNOSIS — D631 Anemia in chronic kidney disease: Secondary | ICD-10-CM | POA: Diagnosis not present

## 2023-06-26 DIAGNOSIS — J9601 Acute respiratory failure with hypoxia: Secondary | ICD-10-CM | POA: Diagnosis not present

## 2023-06-26 DIAGNOSIS — N186 End stage renal disease: Secondary | ICD-10-CM | POA: Diagnosis not present

## 2023-06-26 DIAGNOSIS — J189 Pneumonia, unspecified organism: Secondary | ICD-10-CM | POA: Diagnosis not present

## 2023-06-27 DIAGNOSIS — D631 Anemia in chronic kidney disease: Secondary | ICD-10-CM | POA: Diagnosis not present

## 2023-06-27 DIAGNOSIS — J189 Pneumonia, unspecified organism: Secondary | ICD-10-CM | POA: Diagnosis not present

## 2023-06-27 DIAGNOSIS — J9601 Acute respiratory failure with hypoxia: Secondary | ICD-10-CM | POA: Diagnosis not present

## 2023-06-27 DIAGNOSIS — N186 End stage renal disease: Secondary | ICD-10-CM | POA: Diagnosis not present

## 2023-06-27 DIAGNOSIS — A419 Sepsis, unspecified organism: Secondary | ICD-10-CM | POA: Diagnosis not present

## 2023-06-28 DIAGNOSIS — J189 Pneumonia, unspecified organism: Secondary | ICD-10-CM | POA: Diagnosis not present

## 2023-06-28 DIAGNOSIS — N186 End stage renal disease: Secondary | ICD-10-CM | POA: Diagnosis not present

## 2023-06-28 DIAGNOSIS — A419 Sepsis, unspecified organism: Secondary | ICD-10-CM | POA: Diagnosis not present

## 2023-06-28 DIAGNOSIS — J9601 Acute respiratory failure with hypoxia: Secondary | ICD-10-CM | POA: Diagnosis not present

## 2023-06-28 DIAGNOSIS — D631 Anemia in chronic kidney disease: Secondary | ICD-10-CM | POA: Diagnosis not present

## 2023-06-29 DIAGNOSIS — A419 Sepsis, unspecified organism: Secondary | ICD-10-CM | POA: Diagnosis not present

## 2023-06-29 DIAGNOSIS — J189 Pneumonia, unspecified organism: Secondary | ICD-10-CM | POA: Diagnosis not present

## 2023-06-29 DIAGNOSIS — N186 End stage renal disease: Secondary | ICD-10-CM | POA: Diagnosis not present

## 2023-06-29 DIAGNOSIS — D631 Anemia in chronic kidney disease: Secondary | ICD-10-CM | POA: Diagnosis not present

## 2023-06-29 DIAGNOSIS — J9601 Acute respiratory failure with hypoxia: Secondary | ICD-10-CM | POA: Diagnosis not present

## 2023-06-30 DIAGNOSIS — D631 Anemia in chronic kidney disease: Secondary | ICD-10-CM | POA: Diagnosis not present

## 2023-06-30 DIAGNOSIS — N186 End stage renal disease: Secondary | ICD-10-CM | POA: Diagnosis not present

## 2023-06-30 DIAGNOSIS — A419 Sepsis, unspecified organism: Secondary | ICD-10-CM | POA: Diagnosis not present

## 2023-06-30 DIAGNOSIS — J189 Pneumonia, unspecified organism: Secondary | ICD-10-CM | POA: Diagnosis not present

## 2023-06-30 DIAGNOSIS — J9601 Acute respiratory failure with hypoxia: Secondary | ICD-10-CM | POA: Diagnosis not present

## 2023-07-01 DIAGNOSIS — J189 Pneumonia, unspecified organism: Secondary | ICD-10-CM | POA: Diagnosis not present

## 2023-07-01 DIAGNOSIS — A419 Sepsis, unspecified organism: Secondary | ICD-10-CM | POA: Diagnosis not present

## 2023-07-01 DIAGNOSIS — D631 Anemia in chronic kidney disease: Secondary | ICD-10-CM | POA: Diagnosis not present

## 2023-07-01 DIAGNOSIS — J9601 Acute respiratory failure with hypoxia: Secondary | ICD-10-CM | POA: Diagnosis not present

## 2023-07-01 DIAGNOSIS — N186 End stage renal disease: Secondary | ICD-10-CM | POA: Diagnosis not present

## 2023-07-02 DIAGNOSIS — D649 Anemia, unspecified: Secondary | ICD-10-CM | POA: Diagnosis not present

## 2023-07-02 DIAGNOSIS — N186 End stage renal disease: Secondary | ICD-10-CM | POA: Diagnosis not present

## 2023-07-02 DIAGNOSIS — M1000 Idiopathic gout, unspecified site: Secondary | ICD-10-CM | POA: Diagnosis not present

## 2023-07-02 DIAGNOSIS — I4891 Unspecified atrial fibrillation: Secondary | ICD-10-CM | POA: Diagnosis not present

## 2023-07-02 DIAGNOSIS — G894 Chronic pain syndrome: Secondary | ICD-10-CM | POA: Diagnosis not present

## 2023-07-02 DIAGNOSIS — T8249XA Other complication of vascular dialysis catheter, initial encounter: Secondary | ICD-10-CM | POA: Diagnosis not present

## 2023-07-02 DIAGNOSIS — N2581 Secondary hyperparathyroidism of renal origin: Secondary | ICD-10-CM | POA: Diagnosis not present

## 2023-07-02 DIAGNOSIS — E039 Hypothyroidism, unspecified: Secondary | ICD-10-CM | POA: Diagnosis not present

## 2023-07-02 DIAGNOSIS — Z7401 Bed confinement status: Secondary | ICD-10-CM | POA: Diagnosis not present

## 2023-07-02 DIAGNOSIS — A419 Sepsis, unspecified organism: Secondary | ICD-10-CM | POA: Diagnosis not present

## 2023-07-02 DIAGNOSIS — J189 Pneumonia, unspecified organism: Secondary | ICD-10-CM | POA: Diagnosis not present

## 2023-07-02 DIAGNOSIS — M549 Dorsalgia, unspecified: Secondary | ICD-10-CM | POA: Diagnosis not present

## 2023-07-02 DIAGNOSIS — E785 Hyperlipidemia, unspecified: Secondary | ICD-10-CM | POA: Diagnosis not present

## 2023-07-02 DIAGNOSIS — Z418 Encounter for other procedures for purposes other than remedying health state: Secondary | ICD-10-CM | POA: Diagnosis not present

## 2023-07-02 DIAGNOSIS — R634 Abnormal weight loss: Secondary | ICD-10-CM | POA: Diagnosis not present

## 2023-07-02 DIAGNOSIS — F32A Depression, unspecified: Secondary | ICD-10-CM | POA: Diagnosis not present

## 2023-07-02 DIAGNOSIS — J9601 Acute respiratory failure with hypoxia: Secondary | ICD-10-CM | POA: Diagnosis not present

## 2023-07-02 DIAGNOSIS — I10 Essential (primary) hypertension: Secondary | ICD-10-CM | POA: Diagnosis not present

## 2023-07-02 DIAGNOSIS — N179 Acute kidney failure, unspecified: Secondary | ICD-10-CM | POA: Diagnosis not present

## 2023-07-02 DIAGNOSIS — J449 Chronic obstructive pulmonary disease, unspecified: Secondary | ICD-10-CM | POA: Diagnosis not present

## 2023-07-02 DIAGNOSIS — R531 Weakness: Secondary | ICD-10-CM | POA: Diagnosis not present

## 2023-07-02 DIAGNOSIS — I5030 Unspecified diastolic (congestive) heart failure: Secondary | ICD-10-CM | POA: Diagnosis not present

## 2023-07-02 DIAGNOSIS — D509 Iron deficiency anemia, unspecified: Secondary | ICD-10-CM | POA: Diagnosis not present

## 2023-07-03 DIAGNOSIS — D649 Anemia, unspecified: Secondary | ICD-10-CM | POA: Diagnosis not present

## 2023-07-03 DIAGNOSIS — I10 Essential (primary) hypertension: Secondary | ICD-10-CM | POA: Diagnosis not present

## 2023-07-04 DIAGNOSIS — T8249XA Other complication of vascular dialysis catheter, initial encounter: Secondary | ICD-10-CM | POA: Diagnosis not present

## 2023-07-04 DIAGNOSIS — Z418 Encounter for other procedures for purposes other than remedying health state: Secondary | ICD-10-CM | POA: Diagnosis not present

## 2023-07-04 DIAGNOSIS — N179 Acute kidney failure, unspecified: Secondary | ICD-10-CM | POA: Diagnosis not present

## 2023-07-04 DIAGNOSIS — N2581 Secondary hyperparathyroidism of renal origin: Secondary | ICD-10-CM | POA: Diagnosis not present

## 2023-07-04 DIAGNOSIS — D509 Iron deficiency anemia, unspecified: Secondary | ICD-10-CM | POA: Diagnosis not present

## 2023-07-07 ENCOUNTER — Encounter: Payer: BC Managed Care – PPO | Admitting: Psychology

## 2023-07-07 DIAGNOSIS — T8249XA Other complication of vascular dialysis catheter, initial encounter: Secondary | ICD-10-CM | POA: Diagnosis not present

## 2023-07-07 DIAGNOSIS — Z418 Encounter for other procedures for purposes other than remedying health state: Secondary | ICD-10-CM | POA: Diagnosis not present

## 2023-07-07 DIAGNOSIS — D509 Iron deficiency anemia, unspecified: Secondary | ICD-10-CM | POA: Diagnosis not present

## 2023-07-07 DIAGNOSIS — N2581 Secondary hyperparathyroidism of renal origin: Secondary | ICD-10-CM | POA: Diagnosis not present

## 2023-07-07 DIAGNOSIS — N179 Acute kidney failure, unspecified: Secondary | ICD-10-CM | POA: Diagnosis not present

## 2023-07-09 DIAGNOSIS — D509 Iron deficiency anemia, unspecified: Secondary | ICD-10-CM | POA: Diagnosis not present

## 2023-07-09 DIAGNOSIS — I5030 Unspecified diastolic (congestive) heart failure: Secondary | ICD-10-CM | POA: Diagnosis not present

## 2023-07-09 DIAGNOSIS — N179 Acute kidney failure, unspecified: Secondary | ICD-10-CM | POA: Diagnosis not present

## 2023-07-09 DIAGNOSIS — E039 Hypothyroidism, unspecified: Secondary | ICD-10-CM | POA: Diagnosis not present

## 2023-07-09 DIAGNOSIS — M1000 Idiopathic gout, unspecified site: Secondary | ICD-10-CM | POA: Diagnosis not present

## 2023-07-09 DIAGNOSIS — J449 Chronic obstructive pulmonary disease, unspecified: Secondary | ICD-10-CM | POA: Diagnosis not present

## 2023-07-09 DIAGNOSIS — R634 Abnormal weight loss: Secondary | ICD-10-CM | POA: Diagnosis not present

## 2023-07-09 DIAGNOSIS — Z418 Encounter for other procedures for purposes other than remedying health state: Secondary | ICD-10-CM | POA: Diagnosis not present

## 2023-07-09 DIAGNOSIS — E785 Hyperlipidemia, unspecified: Secondary | ICD-10-CM | POA: Diagnosis not present

## 2023-07-09 DIAGNOSIS — T8249XA Other complication of vascular dialysis catheter, initial encounter: Secondary | ICD-10-CM | POA: Diagnosis not present

## 2023-07-09 DIAGNOSIS — N2581 Secondary hyperparathyroidism of renal origin: Secondary | ICD-10-CM | POA: Diagnosis not present

## 2023-07-09 DIAGNOSIS — J9601 Acute respiratory failure with hypoxia: Secondary | ICD-10-CM | POA: Diagnosis not present

## 2023-07-09 DIAGNOSIS — G894 Chronic pain syndrome: Secondary | ICD-10-CM | POA: Diagnosis not present

## 2023-07-09 DIAGNOSIS — M549 Dorsalgia, unspecified: Secondary | ICD-10-CM | POA: Diagnosis not present

## 2023-07-11 DIAGNOSIS — D509 Iron deficiency anemia, unspecified: Secondary | ICD-10-CM | POA: Diagnosis not present

## 2023-07-11 DIAGNOSIS — T8249XA Other complication of vascular dialysis catheter, initial encounter: Secondary | ICD-10-CM | POA: Diagnosis not present

## 2023-07-11 DIAGNOSIS — Z418 Encounter for other procedures for purposes other than remedying health state: Secondary | ICD-10-CM | POA: Diagnosis not present

## 2023-07-11 DIAGNOSIS — J9601 Acute respiratory failure with hypoxia: Secondary | ICD-10-CM | POA: Diagnosis not present

## 2023-07-11 DIAGNOSIS — N179 Acute kidney failure, unspecified: Secondary | ICD-10-CM | POA: Diagnosis not present

## 2023-07-11 DIAGNOSIS — N186 End stage renal disease: Secondary | ICD-10-CM | POA: Diagnosis not present

## 2023-07-11 DIAGNOSIS — E785 Hyperlipidemia, unspecified: Secondary | ICD-10-CM | POA: Diagnosis not present

## 2023-07-11 DIAGNOSIS — E039 Hypothyroidism, unspecified: Secondary | ICD-10-CM | POA: Diagnosis not present

## 2023-07-11 DIAGNOSIS — N2581 Secondary hyperparathyroidism of renal origin: Secondary | ICD-10-CM | POA: Diagnosis not present

## 2023-07-11 DIAGNOSIS — J449 Chronic obstructive pulmonary disease, unspecified: Secondary | ICD-10-CM | POA: Diagnosis not present

## 2023-07-14 DIAGNOSIS — D509 Iron deficiency anemia, unspecified: Secondary | ICD-10-CM | POA: Diagnosis not present

## 2023-07-14 DIAGNOSIS — N179 Acute kidney failure, unspecified: Secondary | ICD-10-CM | POA: Diagnosis not present

## 2023-07-14 DIAGNOSIS — T8249XA Other complication of vascular dialysis catheter, initial encounter: Secondary | ICD-10-CM | POA: Diagnosis not present

## 2023-07-14 DIAGNOSIS — N2581 Secondary hyperparathyroidism of renal origin: Secondary | ICD-10-CM | POA: Diagnosis not present

## 2023-07-14 DIAGNOSIS — Z418 Encounter for other procedures for purposes other than remedying health state: Secondary | ICD-10-CM | POA: Diagnosis not present

## 2023-07-16 DIAGNOSIS — D509 Iron deficiency anemia, unspecified: Secondary | ICD-10-CM | POA: Diagnosis not present

## 2023-07-16 DIAGNOSIS — F32A Depression, unspecified: Secondary | ICD-10-CM | POA: Diagnosis not present

## 2023-07-16 DIAGNOSIS — T8249XA Other complication of vascular dialysis catheter, initial encounter: Secondary | ICD-10-CM | POA: Diagnosis not present

## 2023-07-16 DIAGNOSIS — M549 Dorsalgia, unspecified: Secondary | ICD-10-CM | POA: Diagnosis not present

## 2023-07-16 DIAGNOSIS — N2581 Secondary hyperparathyroidism of renal origin: Secondary | ICD-10-CM | POA: Diagnosis not present

## 2023-07-16 DIAGNOSIS — I5030 Unspecified diastolic (congestive) heart failure: Secondary | ICD-10-CM | POA: Diagnosis not present

## 2023-07-16 DIAGNOSIS — D649 Anemia, unspecified: Secondary | ICD-10-CM | POA: Diagnosis not present

## 2023-07-16 DIAGNOSIS — E039 Hypothyroidism, unspecified: Secondary | ICD-10-CM | POA: Diagnosis not present

## 2023-07-16 DIAGNOSIS — Z418 Encounter for other procedures for purposes other than remedying health state: Secondary | ICD-10-CM | POA: Diagnosis not present

## 2023-07-16 DIAGNOSIS — I4891 Unspecified atrial fibrillation: Secondary | ICD-10-CM | POA: Diagnosis not present

## 2023-07-16 DIAGNOSIS — J449 Chronic obstructive pulmonary disease, unspecified: Secondary | ICD-10-CM | POA: Diagnosis not present

## 2023-07-16 DIAGNOSIS — E785 Hyperlipidemia, unspecified: Secondary | ICD-10-CM | POA: Diagnosis not present

## 2023-07-16 DIAGNOSIS — G894 Chronic pain syndrome: Secondary | ICD-10-CM | POA: Diagnosis not present

## 2023-07-16 DIAGNOSIS — N179 Acute kidney failure, unspecified: Secondary | ICD-10-CM | POA: Diagnosis not present

## 2023-07-18 DIAGNOSIS — N179 Acute kidney failure, unspecified: Secondary | ICD-10-CM | POA: Diagnosis not present

## 2023-07-18 DIAGNOSIS — N2581 Secondary hyperparathyroidism of renal origin: Secondary | ICD-10-CM | POA: Diagnosis not present

## 2023-07-18 DIAGNOSIS — D509 Iron deficiency anemia, unspecified: Secondary | ICD-10-CM | POA: Diagnosis not present

## 2023-07-18 DIAGNOSIS — T8249XA Other complication of vascular dialysis catheter, initial encounter: Secondary | ICD-10-CM | POA: Diagnosis not present

## 2023-07-18 DIAGNOSIS — Z418 Encounter for other procedures for purposes other than remedying health state: Secondary | ICD-10-CM | POA: Diagnosis not present

## 2023-07-21 ENCOUNTER — Other Ambulatory Visit (HOSPITAL_COMMUNITY): Payer: Self-pay

## 2023-07-21 DIAGNOSIS — T8249XA Other complication of vascular dialysis catheter, initial encounter: Secondary | ICD-10-CM | POA: Diagnosis not present

## 2023-07-21 DIAGNOSIS — D509 Iron deficiency anemia, unspecified: Secondary | ICD-10-CM | POA: Diagnosis not present

## 2023-07-21 DIAGNOSIS — Z418 Encounter for other procedures for purposes other than remedying health state: Secondary | ICD-10-CM | POA: Diagnosis not present

## 2023-07-21 DIAGNOSIS — N2581 Secondary hyperparathyroidism of renal origin: Secondary | ICD-10-CM | POA: Diagnosis not present

## 2023-07-21 DIAGNOSIS — N179 Acute kidney failure, unspecified: Secondary | ICD-10-CM | POA: Diagnosis not present

## 2023-07-23 DIAGNOSIS — T8249XA Other complication of vascular dialysis catheter, initial encounter: Secondary | ICD-10-CM | POA: Diagnosis not present

## 2023-07-23 DIAGNOSIS — N2581 Secondary hyperparathyroidism of renal origin: Secondary | ICD-10-CM | POA: Diagnosis not present

## 2023-07-23 DIAGNOSIS — Z418 Encounter for other procedures for purposes other than remedying health state: Secondary | ICD-10-CM | POA: Diagnosis not present

## 2023-07-23 DIAGNOSIS — N179 Acute kidney failure, unspecified: Secondary | ICD-10-CM | POA: Diagnosis not present

## 2023-07-23 DIAGNOSIS — D509 Iron deficiency anemia, unspecified: Secondary | ICD-10-CM | POA: Diagnosis not present

## 2023-07-25 DIAGNOSIS — J9601 Acute respiratory failure with hypoxia: Secondary | ICD-10-CM | POA: Diagnosis not present

## 2023-07-25 DIAGNOSIS — T8249XA Other complication of vascular dialysis catheter, initial encounter: Secondary | ICD-10-CM | POA: Diagnosis not present

## 2023-07-25 DIAGNOSIS — I120 Hypertensive chronic kidney disease with stage 5 chronic kidney disease or end stage renal disease: Secondary | ICD-10-CM | POA: Diagnosis not present

## 2023-07-25 DIAGNOSIS — J189 Pneumonia, unspecified organism: Secondary | ICD-10-CM | POA: Diagnosis not present

## 2023-07-25 DIAGNOSIS — D509 Iron deficiency anemia, unspecified: Secondary | ICD-10-CM | POA: Diagnosis not present

## 2023-07-25 DIAGNOSIS — L89151 Pressure ulcer of sacral region, stage 1: Secondary | ICD-10-CM | POA: Diagnosis not present

## 2023-07-25 DIAGNOSIS — N186 End stage renal disease: Secondary | ICD-10-CM | POA: Diagnosis not present

## 2023-07-25 DIAGNOSIS — Z418 Encounter for other procedures for purposes other than remedying health state: Secondary | ICD-10-CM | POA: Diagnosis not present

## 2023-07-25 DIAGNOSIS — A419 Sepsis, unspecified organism: Secondary | ICD-10-CM | POA: Diagnosis not present

## 2023-07-25 DIAGNOSIS — D631 Anemia in chronic kidney disease: Secondary | ICD-10-CM | POA: Diagnosis not present

## 2023-07-25 DIAGNOSIS — R6520 Severe sepsis without septic shock: Secondary | ICD-10-CM | POA: Diagnosis not present

## 2023-07-25 DIAGNOSIS — J440 Chronic obstructive pulmonary disease with acute lower respiratory infection: Secondary | ICD-10-CM | POA: Diagnosis not present

## 2023-07-25 DIAGNOSIS — N2581 Secondary hyperparathyroidism of renal origin: Secondary | ICD-10-CM | POA: Diagnosis not present

## 2023-07-25 DIAGNOSIS — N179 Acute kidney failure, unspecified: Secondary | ICD-10-CM | POA: Diagnosis not present

## 2023-07-28 DIAGNOSIS — D509 Iron deficiency anemia, unspecified: Secondary | ICD-10-CM | POA: Diagnosis not present

## 2023-07-28 DIAGNOSIS — T8249XA Other complication of vascular dialysis catheter, initial encounter: Secondary | ICD-10-CM | POA: Diagnosis not present

## 2023-07-28 DIAGNOSIS — N2581 Secondary hyperparathyroidism of renal origin: Secondary | ICD-10-CM | POA: Diagnosis not present

## 2023-07-28 DIAGNOSIS — N179 Acute kidney failure, unspecified: Secondary | ICD-10-CM | POA: Diagnosis not present

## 2023-07-28 DIAGNOSIS — Z418 Encounter for other procedures for purposes other than remedying health state: Secondary | ICD-10-CM | POA: Diagnosis not present

## 2023-07-30 DIAGNOSIS — D631 Anemia in chronic kidney disease: Secondary | ICD-10-CM | POA: Diagnosis not present

## 2023-07-30 DIAGNOSIS — I120 Hypertensive chronic kidney disease with stage 5 chronic kidney disease or end stage renal disease: Secondary | ICD-10-CM | POA: Diagnosis not present

## 2023-07-30 DIAGNOSIS — J9601 Acute respiratory failure with hypoxia: Secondary | ICD-10-CM | POA: Diagnosis not present

## 2023-07-30 DIAGNOSIS — L89151 Pressure ulcer of sacral region, stage 1: Secondary | ICD-10-CM | POA: Diagnosis not present

## 2023-07-30 DIAGNOSIS — T8249XA Other complication of vascular dialysis catheter, initial encounter: Secondary | ICD-10-CM | POA: Diagnosis not present

## 2023-07-30 DIAGNOSIS — D509 Iron deficiency anemia, unspecified: Secondary | ICD-10-CM | POA: Diagnosis not present

## 2023-07-30 DIAGNOSIS — R6520 Severe sepsis without septic shock: Secondary | ICD-10-CM | POA: Diagnosis not present

## 2023-07-30 DIAGNOSIS — N179 Acute kidney failure, unspecified: Secondary | ICD-10-CM | POA: Diagnosis not present

## 2023-07-30 DIAGNOSIS — Z418 Encounter for other procedures for purposes other than remedying health state: Secondary | ICD-10-CM | POA: Diagnosis not present

## 2023-07-30 DIAGNOSIS — N2581 Secondary hyperparathyroidism of renal origin: Secondary | ICD-10-CM | POA: Diagnosis not present

## 2023-07-30 DIAGNOSIS — N186 End stage renal disease: Secondary | ICD-10-CM | POA: Diagnosis not present

## 2023-07-30 DIAGNOSIS — J440 Chronic obstructive pulmonary disease with acute lower respiratory infection: Secondary | ICD-10-CM | POA: Diagnosis not present

## 2023-07-30 DIAGNOSIS — J189 Pneumonia, unspecified organism: Secondary | ICD-10-CM | POA: Diagnosis not present

## 2023-07-30 DIAGNOSIS — A419 Sepsis, unspecified organism: Secondary | ICD-10-CM | POA: Diagnosis not present

## 2023-07-31 DIAGNOSIS — N186 End stage renal disease: Secondary | ICD-10-CM | POA: Diagnosis not present

## 2023-07-31 DIAGNOSIS — I120 Hypertensive chronic kidney disease with stage 5 chronic kidney disease or end stage renal disease: Secondary | ICD-10-CM | POA: Diagnosis not present

## 2023-07-31 DIAGNOSIS — J9601 Acute respiratory failure with hypoxia: Secondary | ICD-10-CM | POA: Diagnosis not present

## 2023-07-31 DIAGNOSIS — R6520 Severe sepsis without septic shock: Secondary | ICD-10-CM | POA: Diagnosis not present

## 2023-07-31 DIAGNOSIS — A419 Sepsis, unspecified organism: Secondary | ICD-10-CM | POA: Diagnosis not present

## 2023-07-31 DIAGNOSIS — J440 Chronic obstructive pulmonary disease with acute lower respiratory infection: Secondary | ICD-10-CM | POA: Diagnosis not present

## 2023-07-31 DIAGNOSIS — J189 Pneumonia, unspecified organism: Secondary | ICD-10-CM | POA: Diagnosis not present

## 2023-07-31 DIAGNOSIS — L89151 Pressure ulcer of sacral region, stage 1: Secondary | ICD-10-CM | POA: Diagnosis not present

## 2023-07-31 DIAGNOSIS — D631 Anemia in chronic kidney disease: Secondary | ICD-10-CM | POA: Diagnosis not present

## 2023-08-01 DIAGNOSIS — D509 Iron deficiency anemia, unspecified: Secondary | ICD-10-CM | POA: Diagnosis not present

## 2023-08-01 DIAGNOSIS — N2581 Secondary hyperparathyroidism of renal origin: Secondary | ICD-10-CM | POA: Diagnosis not present

## 2023-08-01 DIAGNOSIS — N179 Acute kidney failure, unspecified: Secondary | ICD-10-CM | POA: Diagnosis not present

## 2023-08-01 DIAGNOSIS — Z418 Encounter for other procedures for purposes other than remedying health state: Secondary | ICD-10-CM | POA: Diagnosis not present

## 2023-08-01 DIAGNOSIS — T8249XA Other complication of vascular dialysis catheter, initial encounter: Secondary | ICD-10-CM | POA: Diagnosis not present

## 2023-08-04 DIAGNOSIS — T8249XA Other complication of vascular dialysis catheter, initial encounter: Secondary | ICD-10-CM | POA: Diagnosis not present

## 2023-08-04 DIAGNOSIS — N2581 Secondary hyperparathyroidism of renal origin: Secondary | ICD-10-CM | POA: Diagnosis not present

## 2023-08-04 DIAGNOSIS — N179 Acute kidney failure, unspecified: Secondary | ICD-10-CM | POA: Diagnosis not present

## 2023-08-04 DIAGNOSIS — Z418 Encounter for other procedures for purposes other than remedying health state: Secondary | ICD-10-CM | POA: Diagnosis not present

## 2023-08-04 DIAGNOSIS — D509 Iron deficiency anemia, unspecified: Secondary | ICD-10-CM | POA: Diagnosis not present

## 2023-08-05 DIAGNOSIS — I120 Hypertensive chronic kidney disease with stage 5 chronic kidney disease or end stage renal disease: Secondary | ICD-10-CM | POA: Diagnosis not present

## 2023-08-06 DIAGNOSIS — Z418 Encounter for other procedures for purposes other than remedying health state: Secondary | ICD-10-CM | POA: Diagnosis not present

## 2023-08-06 DIAGNOSIS — N2581 Secondary hyperparathyroidism of renal origin: Secondary | ICD-10-CM | POA: Diagnosis not present

## 2023-08-06 DIAGNOSIS — T8249XA Other complication of vascular dialysis catheter, initial encounter: Secondary | ICD-10-CM | POA: Diagnosis not present

## 2023-08-06 DIAGNOSIS — D509 Iron deficiency anemia, unspecified: Secondary | ICD-10-CM | POA: Diagnosis not present

## 2023-08-06 DIAGNOSIS — N179 Acute kidney failure, unspecified: Secondary | ICD-10-CM | POA: Diagnosis not present

## 2023-08-07 DIAGNOSIS — D631 Anemia in chronic kidney disease: Secondary | ICD-10-CM | POA: Diagnosis not present

## 2023-08-07 DIAGNOSIS — I10 Essential (primary) hypertension: Secondary | ICD-10-CM | POA: Diagnosis not present

## 2023-08-07 DIAGNOSIS — J440 Chronic obstructive pulmonary disease with acute lower respiratory infection: Secondary | ICD-10-CM | POA: Diagnosis not present

## 2023-08-07 DIAGNOSIS — E039 Hypothyroidism, unspecified: Secondary | ICD-10-CM | POA: Diagnosis not present

## 2023-08-07 DIAGNOSIS — I120 Hypertensive chronic kidney disease with stage 5 chronic kidney disease or end stage renal disease: Secondary | ICD-10-CM | POA: Diagnosis not present

## 2023-08-07 DIAGNOSIS — J189 Pneumonia, unspecified organism: Secondary | ICD-10-CM | POA: Diagnosis not present

## 2023-08-07 DIAGNOSIS — A419 Sepsis, unspecified organism: Secondary | ICD-10-CM | POA: Diagnosis not present

## 2023-08-07 DIAGNOSIS — R413 Other amnesia: Secondary | ICD-10-CM | POA: Diagnosis not present

## 2023-08-07 DIAGNOSIS — Q809 Congenital ichthyosis, unspecified: Secondary | ICD-10-CM | POA: Diagnosis not present

## 2023-08-07 DIAGNOSIS — R6520 Severe sepsis without septic shock: Secondary | ICD-10-CM | POA: Diagnosis not present

## 2023-08-07 DIAGNOSIS — E785 Hyperlipidemia, unspecified: Secondary | ICD-10-CM | POA: Diagnosis not present

## 2023-08-07 DIAGNOSIS — K219 Gastro-esophageal reflux disease without esophagitis: Secondary | ICD-10-CM | POA: Diagnosis not present

## 2023-08-07 DIAGNOSIS — L89151 Pressure ulcer of sacral region, stage 1: Secondary | ICD-10-CM | POA: Diagnosis not present

## 2023-08-07 DIAGNOSIS — J9601 Acute respiratory failure with hypoxia: Secondary | ICD-10-CM | POA: Diagnosis not present

## 2023-08-07 DIAGNOSIS — Z681 Body mass index (BMI) 19 or less, adult: Secondary | ICD-10-CM | POA: Diagnosis not present

## 2023-08-07 DIAGNOSIS — N186 End stage renal disease: Secondary | ICD-10-CM | POA: Diagnosis not present

## 2023-08-08 DIAGNOSIS — N179 Acute kidney failure, unspecified: Secondary | ICD-10-CM | POA: Diagnosis not present

## 2023-08-08 DIAGNOSIS — T8249XA Other complication of vascular dialysis catheter, initial encounter: Secondary | ICD-10-CM | POA: Diagnosis not present

## 2023-08-08 DIAGNOSIS — N2581 Secondary hyperparathyroidism of renal origin: Secondary | ICD-10-CM | POA: Diagnosis not present

## 2023-08-08 DIAGNOSIS — Z418 Encounter for other procedures for purposes other than remedying health state: Secondary | ICD-10-CM | POA: Diagnosis not present

## 2023-08-08 DIAGNOSIS — D509 Iron deficiency anemia, unspecified: Secondary | ICD-10-CM | POA: Diagnosis not present

## 2023-08-11 DIAGNOSIS — D509 Iron deficiency anemia, unspecified: Secondary | ICD-10-CM | POA: Diagnosis not present

## 2023-08-11 DIAGNOSIS — N179 Acute kidney failure, unspecified: Secondary | ICD-10-CM | POA: Diagnosis not present

## 2023-08-11 DIAGNOSIS — T8249XA Other complication of vascular dialysis catheter, initial encounter: Secondary | ICD-10-CM | POA: Diagnosis not present

## 2023-08-11 DIAGNOSIS — Z418 Encounter for other procedures for purposes other than remedying health state: Secondary | ICD-10-CM | POA: Diagnosis not present

## 2023-08-11 DIAGNOSIS — N2581 Secondary hyperparathyroidism of renal origin: Secondary | ICD-10-CM | POA: Diagnosis not present

## 2023-08-12 DIAGNOSIS — D631 Anemia in chronic kidney disease: Secondary | ICD-10-CM | POA: Diagnosis not present

## 2023-08-12 DIAGNOSIS — N186 End stage renal disease: Secondary | ICD-10-CM | POA: Diagnosis not present

## 2023-08-12 DIAGNOSIS — J440 Chronic obstructive pulmonary disease with acute lower respiratory infection: Secondary | ICD-10-CM | POA: Diagnosis not present

## 2023-08-12 DIAGNOSIS — L89151 Pressure ulcer of sacral region, stage 1: Secondary | ICD-10-CM | POA: Diagnosis not present

## 2023-08-12 DIAGNOSIS — J189 Pneumonia, unspecified organism: Secondary | ICD-10-CM | POA: Diagnosis not present

## 2023-08-12 DIAGNOSIS — R6520 Severe sepsis without septic shock: Secondary | ICD-10-CM | POA: Diagnosis not present

## 2023-08-12 DIAGNOSIS — J9601 Acute respiratory failure with hypoxia: Secondary | ICD-10-CM | POA: Diagnosis not present

## 2023-08-12 DIAGNOSIS — I120 Hypertensive chronic kidney disease with stage 5 chronic kidney disease or end stage renal disease: Secondary | ICD-10-CM | POA: Diagnosis not present

## 2023-08-12 DIAGNOSIS — A419 Sepsis, unspecified organism: Secondary | ICD-10-CM | POA: Diagnosis not present

## 2023-08-14 DIAGNOSIS — R6520 Severe sepsis without septic shock: Secondary | ICD-10-CM | POA: Diagnosis not present

## 2023-08-14 DIAGNOSIS — J9601 Acute respiratory failure with hypoxia: Secondary | ICD-10-CM | POA: Diagnosis not present

## 2023-08-14 DIAGNOSIS — J189 Pneumonia, unspecified organism: Secondary | ICD-10-CM | POA: Diagnosis not present

## 2023-08-14 DIAGNOSIS — D631 Anemia in chronic kidney disease: Secondary | ICD-10-CM | POA: Diagnosis not present

## 2023-08-14 DIAGNOSIS — I120 Hypertensive chronic kidney disease with stage 5 chronic kidney disease or end stage renal disease: Secondary | ICD-10-CM | POA: Diagnosis not present

## 2023-08-14 DIAGNOSIS — L89151 Pressure ulcer of sacral region, stage 1: Secondary | ICD-10-CM | POA: Diagnosis not present

## 2023-08-14 DIAGNOSIS — A419 Sepsis, unspecified organism: Secondary | ICD-10-CM | POA: Diagnosis not present

## 2023-08-14 DIAGNOSIS — N186 End stage renal disease: Secondary | ICD-10-CM | POA: Diagnosis not present

## 2023-08-14 DIAGNOSIS — J440 Chronic obstructive pulmonary disease with acute lower respiratory infection: Secondary | ICD-10-CM | POA: Diagnosis not present

## 2023-08-22 DIAGNOSIS — L89151 Pressure ulcer of sacral region, stage 1: Secondary | ICD-10-CM | POA: Diagnosis not present

## 2023-08-22 DIAGNOSIS — J189 Pneumonia, unspecified organism: Secondary | ICD-10-CM | POA: Diagnosis not present

## 2023-08-22 DIAGNOSIS — N186 End stage renal disease: Secondary | ICD-10-CM | POA: Diagnosis not present

## 2023-08-22 DIAGNOSIS — A419 Sepsis, unspecified organism: Secondary | ICD-10-CM | POA: Diagnosis not present

## 2023-08-22 DIAGNOSIS — J9601 Acute respiratory failure with hypoxia: Secondary | ICD-10-CM | POA: Diagnosis not present

## 2023-08-22 DIAGNOSIS — I120 Hypertensive chronic kidney disease with stage 5 chronic kidney disease or end stage renal disease: Secondary | ICD-10-CM | POA: Diagnosis not present

## 2023-08-22 DIAGNOSIS — D631 Anemia in chronic kidney disease: Secondary | ICD-10-CM | POA: Diagnosis not present

## 2023-08-22 DIAGNOSIS — J440 Chronic obstructive pulmonary disease with acute lower respiratory infection: Secondary | ICD-10-CM | POA: Diagnosis not present

## 2023-08-22 DIAGNOSIS — R6520 Severe sepsis without septic shock: Secondary | ICD-10-CM | POA: Diagnosis not present

## 2023-08-25 DIAGNOSIS — N2581 Secondary hyperparathyroidism of renal origin: Secondary | ICD-10-CM | POA: Diagnosis not present

## 2023-08-25 DIAGNOSIS — T8249XA Other complication of vascular dialysis catheter, initial encounter: Secondary | ICD-10-CM | POA: Diagnosis not present

## 2023-08-25 DIAGNOSIS — N179 Acute kidney failure, unspecified: Secondary | ICD-10-CM | POA: Diagnosis not present

## 2023-08-25 DIAGNOSIS — D509 Iron deficiency anemia, unspecified: Secondary | ICD-10-CM | POA: Diagnosis not present

## 2023-08-25 DIAGNOSIS — Z418 Encounter for other procedures for purposes other than remedying health state: Secondary | ICD-10-CM | POA: Diagnosis not present

## 2023-08-28 DIAGNOSIS — N179 Acute kidney failure, unspecified: Secondary | ICD-10-CM | POA: Diagnosis not present

## 2023-08-28 DIAGNOSIS — Z992 Dependence on renal dialysis: Secondary | ICD-10-CM | POA: Diagnosis not present

## 2023-08-28 DIAGNOSIS — I120 Hypertensive chronic kidney disease with stage 5 chronic kidney disease or end stage renal disease: Secondary | ICD-10-CM | POA: Diagnosis not present

## 2023-08-28 DIAGNOSIS — N186 End stage renal disease: Secondary | ICD-10-CM | POA: Diagnosis not present

## 2023-09-26 DEATH — deceased

## 2023-10-22 ENCOUNTER — Ambulatory Visit: Payer: Self-pay

## 2023-10-22 ENCOUNTER — Institutional Professional Consult (permissible substitution): Payer: Medicare Other | Admitting: Psychology

## 2023-10-29 ENCOUNTER — Encounter: Payer: Medicare Other | Admitting: Psychology
